# Patient Record
Sex: Female | Born: 1955 | Race: Black or African American | Hispanic: No | Smoking: Former smoker
Health system: Southern US, Community
[De-identification: ages and names within clinical notes are randomized; demographics above are authoritative.]

## PROBLEM LIST (undated history)

## (undated) DIAGNOSIS — M1990 Unspecified osteoarthritis, unspecified site: Secondary | ICD-10-CM

## (undated) DIAGNOSIS — F419 Anxiety disorder, unspecified: Secondary | ICD-10-CM

## (undated) DIAGNOSIS — I10 Essential (primary) hypertension: Secondary | ICD-10-CM

## (undated) DIAGNOSIS — Z933 Colostomy status: Secondary | ICD-10-CM

## (undated) DIAGNOSIS — F32A Depression, unspecified: Secondary | ICD-10-CM

## (undated) DIAGNOSIS — F329 Major depressive disorder, single episode, unspecified: Secondary | ICD-10-CM

## (undated) DIAGNOSIS — K5792 Diverticulitis of intestine, part unspecified, without perforation or abscess without bleeding: Secondary | ICD-10-CM

## (undated) DIAGNOSIS — F209 Schizophrenia, unspecified: Secondary | ICD-10-CM

## (undated) HISTORY — PX: TUBAL LIGATION: SHX77

---

## 2000-12-18 ENCOUNTER — Encounter: Admission: RE | Admit: 2000-12-18 | Discharge: 2000-12-18 | Payer: Self-pay | Admitting: Internal Medicine

## 2015-06-07 ENCOUNTER — Emergency Department (HOSPITAL_COMMUNITY): Payer: Medicaid Other

## 2015-06-07 ENCOUNTER — Encounter (HOSPITAL_COMMUNITY): Payer: Self-pay | Admitting: *Deleted

## 2015-06-07 ENCOUNTER — Inpatient Hospital Stay (HOSPITAL_COMMUNITY)
Admission: EM | Admit: 2015-06-07 | Discharge: 2015-06-18 | DRG: 854 | Disposition: A | Discharge status: Skilled Nursing Facility | Payer: Medicaid Other | Attending: Internal Medicine | Admitting: Internal Medicine

## 2015-06-07 DIAGNOSIS — Z79899 Other long term (current) drug therapy: Secondary | ICD-10-CM

## 2015-06-07 DIAGNOSIS — I471 Supraventricular tachycardia: Secondary | ICD-10-CM | POA: Diagnosis present

## 2015-06-07 DIAGNOSIS — D72829 Elevated white blood cell count, unspecified: Secondary | ICD-10-CM | POA: Diagnosis not present

## 2015-06-07 DIAGNOSIS — A419 Sepsis, unspecified organism: Secondary | ICD-10-CM | POA: Diagnosis present

## 2015-06-07 DIAGNOSIS — M1990 Unspecified osteoarthritis, unspecified site: Secondary | ICD-10-CM | POA: Diagnosis present

## 2015-06-07 DIAGNOSIS — Z791 Long term (current) use of non-steroidal anti-inflammatories (NSAID): Secondary | ICD-10-CM | POA: Diagnosis not present

## 2015-06-07 DIAGNOSIS — I4891 Unspecified atrial fibrillation: Secondary | ICD-10-CM | POA: Diagnosis not present

## 2015-06-07 DIAGNOSIS — K567 Ileus, unspecified: Secondary | ICD-10-CM | POA: Diagnosis not present

## 2015-06-07 DIAGNOSIS — Z6839 Body mass index (BMI) 39.0-39.9, adult: Secondary | ICD-10-CM

## 2015-06-07 DIAGNOSIS — N179 Acute kidney failure, unspecified: Secondary | ICD-10-CM | POA: Diagnosis present

## 2015-06-07 DIAGNOSIS — Z7982 Long term (current) use of aspirin: Secondary | ICD-10-CM

## 2015-06-07 DIAGNOSIS — E669 Obesity, unspecified: Secondary | ICD-10-CM | POA: Diagnosis not present

## 2015-06-07 DIAGNOSIS — K5720 Diverticulitis of large intestine with perforation and abscess without bleeding: Secondary | ICD-10-CM | POA: Diagnosis not present

## 2015-06-07 DIAGNOSIS — N19 Unspecified kidney failure: Secondary | ICD-10-CM

## 2015-06-07 DIAGNOSIS — I959 Hypotension, unspecified: Secondary | ICD-10-CM | POA: Diagnosis present

## 2015-06-07 DIAGNOSIS — K659 Peritonitis, unspecified: Secondary | ICD-10-CM

## 2015-06-07 DIAGNOSIS — Z933 Colostomy status: Secondary | ICD-10-CM | POA: Diagnosis not present

## 2015-06-07 DIAGNOSIS — I4892 Unspecified atrial flutter: Secondary | ICD-10-CM | POA: Diagnosis not present

## 2015-06-07 DIAGNOSIS — F329 Major depressive disorder, single episode, unspecified: Secondary | ICD-10-CM | POA: Diagnosis present

## 2015-06-07 DIAGNOSIS — R109 Unspecified abdominal pain: Secondary | ICD-10-CM | POA: Diagnosis present

## 2015-06-07 DIAGNOSIS — E869 Volume depletion, unspecified: Secondary | ICD-10-CM | POA: Diagnosis present

## 2015-06-07 DIAGNOSIS — I10 Essential (primary) hypertension: Secondary | ICD-10-CM | POA: Diagnosis present

## 2015-06-07 DIAGNOSIS — F32A Depression, unspecified: Secondary | ICD-10-CM

## 2015-06-07 DIAGNOSIS — R300 Dysuria: Secondary | ICD-10-CM | POA: Diagnosis present

## 2015-06-07 DIAGNOSIS — N289 Disorder of kidney and ureter, unspecified: Secondary | ICD-10-CM

## 2015-06-07 DIAGNOSIS — I483 Typical atrial flutter: Secondary | ICD-10-CM | POA: Diagnosis not present

## 2015-06-07 DIAGNOSIS — D62 Acute posthemorrhagic anemia: Secondary | ICD-10-CM | POA: Diagnosis not present

## 2015-06-07 HISTORY — DX: Essential (primary) hypertension: I10

## 2015-06-07 LAB — CBC WITH DIFFERENTIAL/PLATELET
Basophils Absolute: 0 10*3/uL (ref 0.0–0.1)
Basophils Relative: 0 % (ref 0–1)
Eosinophils Absolute: 0 10*3/uL (ref 0.0–0.7)
Eosinophils Relative: 0 % (ref 0–5)
HCT: 37.5 % (ref 36.0–46.0)
Hemoglobin: 12.6 g/dL (ref 12.0–15.0)
Lymphocytes Relative: 7 % — ABNORMAL LOW (ref 12–46)
Lymphs Abs: 1 10*3/uL (ref 0.7–4.0)
MCH: 29.4 pg (ref 26.0–34.0)
MCHC: 33.6 g/dL (ref 30.0–36.0)
MCV: 87.4 fL (ref 78.0–100.0)
Monocytes Absolute: 1 10*3/uL (ref 0.1–1.0)
Monocytes Relative: 7 % (ref 3–12)
Neutro Abs: 12.8 10*3/uL — ABNORMAL HIGH (ref 1.7–7.7)
Neutrophils Relative %: 86 % — ABNORMAL HIGH (ref 43–77)
Platelets: 336 10*3/uL (ref 150–400)
RBC: 4.29 MIL/uL (ref 3.87–5.11)
RDW: 13.7 % (ref 11.5–15.5)
WBC: 14.8 10*3/uL — ABNORMAL HIGH (ref 4.0–10.5)

## 2015-06-07 LAB — URINALYSIS, ROUTINE W REFLEX MICROSCOPIC
Bilirubin Urine: NEGATIVE
Glucose, UA: NEGATIVE mg/dL
Hgb urine dipstick: NEGATIVE
Ketones, ur: NEGATIVE mg/dL
Leukocytes, UA: NEGATIVE
Nitrite: NEGATIVE
Protein, ur: NEGATIVE mg/dL
Specific Gravity, Urine: 1.017 (ref 1.005–1.030)
Urobilinogen, UA: 0.2 mg/dL (ref 0.0–1.0)
pH: 5 (ref 5.0–8.0)

## 2015-06-07 LAB — BASIC METABOLIC PANEL
Anion gap: 11 (ref 5–15)
BUN: 28 mg/dL — ABNORMAL HIGH (ref 6–20)
CO2: 19 mmol/L — ABNORMAL LOW (ref 22–32)
Calcium: 8.7 mg/dL — ABNORMAL LOW (ref 8.9–10.3)
Chloride: 105 mmol/L (ref 101–111)
Creatinine, Ser: 1.43 mg/dL — ABNORMAL HIGH (ref 0.44–1.00)
GFR calc Af Amer: 46 mL/min — ABNORMAL LOW (ref >60)
GFR calc non Af Amer: 40 mL/min — ABNORMAL LOW (ref >60)
Glucose, Bld: 115 mg/dL — ABNORMAL HIGH (ref 65–99)
Potassium: 3.7 mmol/L (ref 3.5–5.1)
Sodium: 135 mmol/L (ref 135–145)

## 2015-06-07 LAB — MRSA PCR SCREENING: MRSA by PCR: NEGATIVE

## 2015-06-07 MED ORDER — ONDANSETRON HCL 4 MG/2ML IJ SOLN
4.0000 mg | Freq: Four times a day (QID) | INTRAMUSCULAR | Status: DC | PRN
  Administered 2015-06-07 – 2015-06-12 (×12): 4 mg via INTRAVENOUS
  Filled 2015-06-07 (×15): qty 2

## 2015-06-07 MED ORDER — SODIUM CHLORIDE 0.9 % IV SOLN
INTRAVENOUS | Status: DC
  Administered 2015-06-07 – 2015-06-09 (×5): via INTRAVENOUS
  Administered 2015-06-11: 1000 mL via INTRAVENOUS
  Administered 2015-06-12: 125 mL via INTRAVENOUS

## 2015-06-07 MED ORDER — ENOXAPARIN SODIUM 40 MG/0.4ML ~~LOC~~ SOLN
40.0000 mg | SUBCUTANEOUS | Status: DC
  Filled 2015-06-07: qty 0.4

## 2015-06-07 MED ORDER — CIPROFLOXACIN IN D5W 400 MG/200ML IV SOLN
400.0000 mg | Freq: Two times a day (BID) | INTRAVENOUS | Status: DC
  Administered 2015-06-07: 400 mg via INTRAVENOUS
  Filled 2015-06-07: qty 200

## 2015-06-07 MED ORDER — METRONIDAZOLE IN NACL 5-0.79 MG/ML-% IV SOLN
500.0000 mg | Freq: Once | INTRAVENOUS | Status: AC
  Administered 2015-06-07: 500 mg via INTRAVENOUS
  Filled 2015-06-07: qty 100

## 2015-06-07 MED ORDER — CIPROFLOXACIN IN D5W 400 MG/200ML IV SOLN
400.0000 mg | Freq: Once | INTRAVENOUS | Status: AC
  Administered 2015-06-07: 400 mg via INTRAVENOUS
  Filled 2015-06-07: qty 200

## 2015-06-07 MED ORDER — ONDANSETRON HCL 4 MG PO TABS: 4.0000 mg | ORAL_TABLET | Freq: Four times a day (QID) | ORAL | Status: DC | PRN

## 2015-06-07 MED ORDER — SODIUM CHLORIDE 0.9 % IV SOLN: INTRAVENOUS | Status: DC

## 2015-06-07 MED ORDER — HYDROMORPHONE HCL 1 MG/ML IJ SOLN
1.0000 mg | Freq: Once | INTRAMUSCULAR | Status: AC
  Administered 2015-06-07: 1 mg via INTRAVENOUS
  Filled 2015-06-07: qty 1

## 2015-06-07 MED ORDER — IOHEXOL 300 MG/ML  SOLN
50.0000 mL | Freq: Once | INTRAMUSCULAR | Status: AC | PRN
  Administered 2015-06-07: 50 mL via ORAL

## 2015-06-07 MED ORDER — ONDANSETRON HCL 4 MG/2ML IJ SOLN
4.0000 mg | Freq: Once | INTRAMUSCULAR | Status: AC
  Administered 2015-06-07: 4 mg via INTRAVENOUS
  Filled 2015-06-07: qty 2

## 2015-06-07 MED ORDER — LORAZEPAM 2 MG/ML IJ SOLN
0.5000 mg | Freq: Once | INTRAMUSCULAR | Status: AC
  Administered 2015-06-07: 0.5 mg via INTRAVENOUS
  Filled 2015-06-07: qty 1

## 2015-06-07 MED ORDER — METRONIDAZOLE IN NACL 5-0.79 MG/ML-% IV SOLN
500.0000 mg | Freq: Three times a day (TID) | INTRAVENOUS | Status: DC
  Administered 2015-06-07: 500 mg via INTRAVENOUS
  Filled 2015-06-07 (×2): qty 100

## 2015-06-07 MED ORDER — HYDROMORPHONE HCL 1 MG/ML IJ SOLN
1.0000 mg | Freq: Once | INTRAMUSCULAR | Status: AC
Start: 2015-06-07 — End: 2015-06-07
  Administered 2015-06-07: 1 mg via INTRAVENOUS
  Filled 2015-06-07: qty 1

## 2015-06-07 MED ORDER — HYDROMORPHONE HCL 1 MG/ML IJ SOLN
1.0000 mg | INTRAMUSCULAR | Status: DC | PRN
  Administered 2015-06-07 – 2015-06-09 (×12): 1 mg via INTRAVENOUS
  Filled 2015-06-07 (×16): qty 1

## 2015-06-07 MED ORDER — ALUM & MAG HYDROXIDE-SIMETH 200-200-20 MG/5ML PO SUSP
30.0000 mL | Freq: Four times a day (QID) | ORAL | Status: DC | PRN
  Administered 2015-06-07: 30 mL via ORAL
  Filled 2015-06-07 (×2): qty 30

## 2015-06-07 MED ORDER — SODIUM CHLORIDE 0.9 % IV BOLUS (SEPSIS)
1000.0000 mL | Freq: Once | INTRAVENOUS | Status: AC
Start: 2015-06-07 — End: 2015-06-07
  Administered 2015-06-07: 1000 mL via INTRAVENOUS

## 2015-06-07 MED ORDER — IOHEXOL 300 MG/ML  SOLN
100.0000 mL | Freq: Once | INTRAMUSCULAR | Status: AC | PRN
  Administered 2015-06-07: 100 mL via INTRAVENOUS

## 2015-06-07 NOTE — ED Provider Notes (Addendum)
CSN: [id number]     Arrival date & time 06/07/15  [id number] History   First MD Initiated Contact with Patient 06/07/15 0935     Chief Complaint  Patient presents with  . Back Pain  . Abdominal Pain     (Consider location/radiation/quality/duration/timing/severity/associated sxs/prior Treatment) HPI   59 year old female with lower abdominal and lower back pain. Gradual onset Friday-Saturday. Progressively worsening since then. Describes pain as sharp and constant. It hurts in "both my fallopian tubes." Also pain in the lower back. Associated with dysuria. No hematuria, unusual vaginal bleeding or discharge. No fevers or chills. Nausea but no vomiting. No history similar type pain. Tried Aleve and Goody's powder with no change in symptoms.  Past Medical History  Diagnosis Date  . Hypertension    Past Surgical History  Procedure Laterality Date  . Tubal ligation     No family history on file. History  Substance Use Topics  . Smoking status: Never Smoker   . Smokeless tobacco: Not on file  . Alcohol Use: No   OB History    No data available     Review of Systems  All systems reviewed and negative, other than as noted in HPI.   Allergies  Review of patient's allergies indicates no known allergies.  Home Medications   Prior to Admission medications   Not on File   SpO2 95% Physical Exam  Constitutional: She appears well-developed and well-nourished. No distress.  Laying in bed on right side. Appears uncomfortable.  HENT:  Head: Normocephalic and atraumatic.  Eyes: Conjunctivae are normal. Right eye exhibits no discharge. Left eye exhibits no discharge.  Neck: Neck supple.  Cardiovascular: Normal rate, regular rhythm and normal heart sounds.  Exam reveals no gallop and no friction rub.   No murmur heard. Pulmonary/Chest: Effort normal and breath sounds normal. No respiratory distress.  Abdominal: Soft. She exhibits no distension. There is tenderness.  Lower abdominal  tenderness with guarding. No rebound.   Musculoskeletal: She exhibits no edema or tenderness.  Neurological: She is alert.  Skin: Skin is warm and dry.  Psychiatric: She has a normal mood and affect. Her behavior is normal. Thought content normal.  Nursing note and vitals reviewed.   ED Course  Procedures (including critical care time) CRITICAL CARE Performed by: Virgel Manifold Total critical care time: 35 minutes Critical care time was exclusive of separately billable procedures and treating other patients. Critical care was necessary to treat or prevent imminent or life-threatening deterioration. Critical care was time spent personally by me on the following activities: development of treatment plan with patient and/or surrogate as well as nursing, discussions with consultants, evaluation of patient's response to treatment, examination of patient, obtaining history from patient or surrogate, ordering and performing treatments and interventions, ordering and review of laboratory studies, ordering and review of radiographic studies, pulse oximetry and re-evaluation of patient's condition.   Labs Review Labs Reviewed  CBC WITH DIFFERENTIAL/PLATELET - Abnormal; Notable for the following:    WBC 14.8 (*)    Neutrophils Relative % 86 (*)    Lymphocytes Relative 7 (*)    Neutro Abs 12.8 (*)    All other components within normal limits  BASIC METABOLIC PANEL - Abnormal; Notable for the following:    CO2 19 (*)    Glucose, Bld 115 (*)    BUN 28 (*)    Creatinine, Ser 1.43 (*)    Calcium 8.7 (*)    GFR calc non Af Amer 40 (*)  GFR calc Af Amer 46 (*)    All other components within normal limits  URINALYSIS, ROUTINE W REFLEX MICROSCOPIC (NOT AT [facility])    Imaging Review Ct Abdomen Pelvis W Contrast  06/07/2015   CLINICAL DATA:  Bilateral lower back pain.  EXAM: CT ABDOMEN AND PELVIS WITH CONTRAST  TECHNIQUE: Multidetector CT imaging of the abdomen and pelvis was performed using the  standard protocol following bolus administration of intravenous contrast.  CONTRAST:  166mL OMNIPAQUE IOHEXOL 300 MG/ML  SOLN  COMPARISON:  None.  FINDINGS: There is bibasilar atelectasis, right side greater the left. There is free intraperitoneal air throughout the upper abdomen. Pockets of free air in the lower abdomen. There is a small amount of perihepatic fluid. Normal appearance of the liver and gallbladder. Portal venous system is patent. Normal appearance of the pancreas, spleen and adrenal glands. Normal appearance of both kidneys.  Normal appearance of the stomach and duodenum. There is oral contrast in the distal small bowel and right colon. There is a markedly thickened loop of small bowel in the anterior lower abdomen on sequence 2, image 84. The distal small bowel and the right colon are distended. No significant wall thickening involving the terminal ileum. There is a small amount of fluid in the right lower quadrant of the abdomen. Mild pericolonic inflammatory changes involving the distal descending colon and there are diverticula involving the descending colon and sigmoid colon. There is focal edema or fluid between the sigmoid colon and the thickened loop of small bowel on sequence 2, image 81. No discrete abscess collection.  Small amount of free fluid in the pelvis. No gross abnormality to the uterus or adnexa tissue. Small amount of free fluid along the anterior lower abdomen adjacent to the thickened loop of small bowel. Patient has a periumbilical hernia with mild mesenteric edema.  There is no significant lymphadenopathy in the abdomen or pelvis. However, there is concern for mild lymphadenopathy in the hilar regions bilaterally.  Facet arthropathy in the lower lumbar spine. No acute bone abnormality.  IMPRESSION: Study is positive for pneumoperitoneum. There is fluid and inflammatory changes throughout the lower abdomen and upper pelvis. A short segment of small bowel has wall thickening  and compatible with enteritis. This loop of small bowel is adjacent to the sigmoid colon and there are inflammatory changes adjacent to the distal descending colon and sigmoid colon. Findings raise concern for underlying diverticulitis. These inflammatory changes could represent a perforated diverticulitis with secondary small bowel enteritis. However, a primary small bowel enteritis cannot be excluded. In addition, there is dilatation of the distal small bowel and right colon which could be related to an ileus.  Concern for mild chest lymphadenopathy in the hilar regions. This could be further characterized with a post contrast chest CT.  These results were called by telephone at the time of interpretation on 06/07/2015 at 1:07 pm to Dr. Virgel Manifold , who verbally acknowledged these results.   Electronically Signed   By: Markus Daft M.D.   On: 06/07/2015 13:15     EKG Interpretation None      MDM   Final diagnoses:  Diverticulitis of colon with perforation  Peritonitis    59 year old female with lower abdominal pain. Associated dysuria. Suspect possible UTI or pyelonephritis with pain in her back as well. Will check UA. Degree of pain and tenderness seem out of proportion of what typically see with UTI/pyelo though. Will CT to further evaluate.  Imaging as above. Abx. Surgery consult.  2:11 PM Discussed with Dr. Hassell Done, surgery, who reviewed imaging and evaluated patient in the ED. Medical management at this point. Will discuss with medicine for admission.    Virgel Manifold, MD 06/08/15 [phone]

## 2015-06-07 NOTE — ED Notes (Signed)
Per EMS pt coming from home with c/o  Bilateral lower back pain radiating to lower abdomen and vagina x 2 days, worse today. Pt rates pain as 10/10, she is crying loudly.

## 2015-06-07 NOTE — Consult Note (Signed)
Chief Complaint:  Left sided abdominal pain  History of Present Illness:  Deanna Aguilar is an 59 y.o. female who presents with a several day history of worsening LLQ abdominal pain worse when she tries to go to have a BM.  She had a colonoscopy last year in [place] and was told that she had diverticulosis.  CT today shows diverticulitis with some bubbles of free air.    Past Medical History  Diagnosis Date  . Hypertension     Past Surgical History  Procedure Laterality Date  . Tubal ligation      No current facility-administered medications for this encounter.   Current Outpatient Prescriptions  Medication Sig Dispense Refill  . Aspirin-Acetaminophen-Caffeine 520-260-32.5 MG PACK Take 1-2 packets by mouth every 6 (six) hours as needed (For back pain.).    Marland Kitchen Aspirin-Caffeine 845-65 MG PACK Take 1 packet by mouth every 6 (six) hours as needed (For back pain.).    Marland Kitchen losartan-hydrochlorothiazide (HYZAAR) 100-12.5 MG per tablet Take 1 tablet by mouth daily.    . naproxen sodium (ALEVE) 220 MG tablet Take 440-660 mg by mouth every 8 (eight) hours as needed (For back pain.).     Review of patient's allergies indicates no known allergies. No family history on file. Social History:   reports that she has never smoked. She does not have any smokeless tobacco history on file. She reports that she does not drink alcohol or use illicit drugs.   REVIEW OF SYSTEMS :   Physical Exam:   Blood pressure 109/70, pulse 87, temperature 98.6 F (37 C), temperature source Oral, resp. rate 19, SpO2 97 %. There is no height or weight on file to calculate BMI.  Gen:  WDobese AAF with LLQ abdominal pain  Neurological: Alert and oriented to person, place, and time. Motor and sensory function is grossly intact  Head: Normocephalic and atraumatic.  Abdomen:  Obese; tender on the left side mostly LLQ  LABORATORY RESULTS: Results for orders placed or performed during the hospital encounter of 06/07/15 (from  the past 48 hour(s))  CBC with Differential     Status: Abnormal   Collection Time: 06/07/15 10:18 AM  Result Value Ref Range   WBC 14.8 (H) 4.0 - 10.5 K/uL   RBC 4.29 3.87 - 5.11 MIL/uL   Hemoglobin 12.6 12.0 - 15.0 g/dL   HCT 37.5 36.0 - 46.0 %   MCV 87.4 78.0 - 100.0 fL   MCH 29.4 26.0 - 34.0 pg   MCHC 33.6 30.0 - 36.0 g/dL   RDW 13.7 11.5 - 15.5 %   Platelets 336 150 - 400 K/uL   Neutrophils Relative % 86 (H) 43 - 77 %   Lymphocytes Relative 7 (L) 12 - 46 %   Monocytes Relative 7 3 - 12 %   Eosinophils Relative 0 0 - 5 %   Basophils Relative 0 0 - 1 %   Neutro Abs 12.8 (H) 1.7 - 7.7 K/uL   Lymphs Abs 1.0 0.7 - 4.0 K/uL   Monocytes Absolute 1.0 0.1 - 1.0 K/uL   Eosinophils Absolute 0.0 0.0 - 0.7 K/uL   Basophils Absolute 0.0 0.0 - 0.1 K/uL   WBC Morphology WHITE COUNT CONFIRMED ON SMEAR    Smear Review MORPHOLOGY UNREMARKABLE   Basic metabolic panel     Status: Abnormal   Collection Time: 06/07/15 10:18 AM  Result Value Ref Range   Sodium 135 135 - 145 mmol/L   Potassium 3.7 3.5 - 5.1 mmol/L  Chloride 105 101 - 111 mmol/L   CO2 19 (L) 22 - 32 mmol/L   Glucose, Bld 115 (H) 65 - 99 mg/dL   BUN 28 (H) 6 - 20 mg/dL   Creatinine, Ser 1.43 (H) 0.44 - 1.00 mg/dL   Calcium 8.7 (L) 8.9 - 10.3 mg/dL   GFR calc non Af Amer 40 (L) >60 mL/min   GFR calc Af Amer 46 (L) >60 mL/min    Comment: (NOTE) The eGFR has been calculated using the CKD EPI equation. This calculation has not been validated in all clinical situations. eGFR's persistently <60 mL/min signify possible Chronic Kidney Disease.    Anion gap 11 5 - 15  Urinalysis, Routine w reflex microscopic (not at [facility])     Status: None   Collection Time: 06/07/15 12:30 PM  Result Value Ref Range   Color, Urine YELLOW YELLOW   APPearance CLEAR CLEAR   Specific Gravity, Urine 1.017 1.005 - 1.030   pH 5.0 5.0 - 8.0   Glucose, UA NEGATIVE NEGATIVE mg/dL   Hgb urine dipstick NEGATIVE NEGATIVE   Bilirubin Urine NEGATIVE NEGATIVE    Ketones, ur NEGATIVE NEGATIVE mg/dL   Protein, ur NEGATIVE NEGATIVE mg/dL   Urobilinogen, UA 0.2 0.0 - 1.0 mg/dL   Nitrite NEGATIVE NEGATIVE   Leukocytes, UA NEGATIVE NEGATIVE    Comment: MICROSCOPIC NOT DONE ON URINES WITH NEGATIVE PROTEIN, BLOOD, LEUKOCYTES, NITRITE, OR GLUCOSE <1000 mg/dL.     RADIOLOGY RESULTS: Ct Abdomen Pelvis W Contrast  06/07/2015   ADDENDUM REPORT: 06/07/2015 13:56  ADDENDUM: The urinary bladder is decompressed and difficult to exclude urinary bladder wall thickening or inflammation.   Electronically Signed   By: Markus Daft M.D.   On: 06/07/2015 13:56   06/07/2015   CLINICAL DATA:  Bilateral lower back pain.  EXAM: CT ABDOMEN AND PELVIS WITH CONTRAST  TECHNIQUE: Multidetector CT imaging of the abdomen and pelvis was performed using the standard protocol following bolus administration of intravenous contrast.  CONTRAST:  146m OMNIPAQUE IOHEXOL 300 MG/ML  SOLN  COMPARISON:  None.  FINDINGS: There is bibasilar atelectasis, right side greater the left. There is free intraperitoneal air throughout the upper abdomen. Pockets of free air in the lower abdomen. There is a small amount of perihepatic fluid. Normal appearance of the liver and gallbladder. Portal venous system is patent. Normal appearance of the pancreas, spleen and adrenal glands. Normal appearance of both kidneys.  Normal appearance of the stomach and duodenum. There is oral contrast in the distal small bowel and right colon. There is a markedly thickened loop of small bowel in the anterior lower abdomen on sequence 2, image 84. The distal small bowel and the right colon are distended. No significant wall thickening involving the terminal ileum. There is a small amount of fluid in the right lower quadrant of the abdomen. Mild pericolonic inflammatory changes involving the distal descending colon and there are diverticula involving the descending colon and sigmoid colon. There is focal edema or fluid between the sigmoid  colon and the thickened loop of small bowel on sequence 2, image 81. No discrete abscess collection.  Small amount of free fluid in the pelvis. No gross abnormality to the uterus or adnexa tissue. Small amount of free fluid along the anterior lower abdomen adjacent to the thickened loop of small bowel. Patient has a periumbilical hernia with mild mesenteric edema.  There is no significant lymphadenopathy in the abdomen or pelvis. However, there is concern for mild lymphadenopathy in the hilar  regions bilaterally.  Facet arthropathy in the lower lumbar spine. No acute bone abnormality.  IMPRESSION: Study is positive for pneumoperitoneum. There is fluid and inflammatory changes throughout the lower abdomen and upper pelvis. A short segment of small bowel has wall thickening and compatible with enteritis. This loop of small bowel is adjacent to the sigmoid colon and there are inflammatory changes adjacent to the distal descending colon and sigmoid colon. Findings raise concern for underlying diverticulitis. These inflammatory changes could represent a perforated diverticulitis with secondary small bowel enteritis. However, a primary small bowel enteritis cannot be excluded. In addition, there is dilatation of the distal small bowel and right colon which could be related to an ileus.  Concern for mild chest lymphadenopathy in the hilar regions. This could be further characterized with a post contrast chest CT.  These results were called by telephone at the time of interpretation on 06/07/2015 at 1:07 pm to Dr. Virgel Manifold , who verbally acknowledged these results.  Electronically Signed: By: Markus Daft M.D. On: 06/07/2015 13:15    Problem List: Patient Active Problem List   Diagnosis Date Noted  . Diverticulitis of colon with perforation 06/07/2015    Assessment & Plan: Patient seen and CT scan reviewed.  I think that this would be best managed medically with IV antibiotics.  There is nothing to drain at  present.  She would have a difficult time with a Hartmann procedure.   CCS will follow.      Matt B. Hassell Done, MD, [facility] [phone] beeper [phone]  06/07/2015 2:24 PM

## 2015-06-07 NOTE — ED Notes (Signed)
Patient transported to CT 

## 2015-06-07 NOTE — H&P (Addendum)
[facility] History and Physical  AMBA LIMONES [id number] DOB: 07-Jun-1956 DOA: 06/07/2015   PCP: [name], MD    Chief Complaint: Abdominal pain  HPI: Deanna Aguilar is a 59 y.o. female with hypertension who presents with abdominal pain for 2 days. She points to the suprapubic area and states that it radiates from there to the right and left upper abdomen into her back. She feels pain when she tries to urinate as well. Pain quite severe. She has had nausea but no vomiting. She has had no appetite. She's felt hot and cold but has not checked her temperature. He has been taking Aleve and BC powders help control pain. Dilaudid given in the ER has helped her pain.   General: The patient denies fever, weight loss + anorexia Cardiac: Denies chest pain, syncope, palpitations, pedal edema  Respiratory: Denies cough, shortness of breath, wheezing GI: She's had some small "squirts of stool" other history per history of present illness GU: Denies hematuria, incontinence, + dysuria  Musculoskeletal: + arthritis all over does not take any medication for this Skin: Denies suspicious skin lesions Neurologic: Denies focal weakness or numbness, change in vision Psychiatry: Denies depression or anxiety. Hematologic: no easy bruising or bleeding  All other systems reviewed and found to be negative.  Past Medical History  Diagnosis Date  . Hypertension     Past Surgical History  Procedure Laterality Date  . Tubal ligation      Social History: does not smoke or drink alcohol Lives at home- on disability for inability to ambulate for a long distance    No Known Allergies  Family history:  Mother had renal failure which caused her death.     Prior to Admission medications   Medication Sig Start Date End Date Taking? Authorizing Provider  Aspirin-Acetaminophen-Caffeine 520-260-32.5 MG PACK Take 1-2 packets by mouth every 6 (six) hours as needed (For back pain.).   Yes  Historical Provider, MD  Aspirin-Caffeine 845-65 MG PACK Take 1 packet by mouth every 6 (six) hours as needed (For back pain.).   Yes Historical Provider, MD  losartan-hydrochlorothiazide (HYZAAR) 100-12.5 MG per tablet Take 1 tablet by mouth daily.   Yes Historical Provider, MD  naproxen sodium (ALEVE) 220 MG tablet Take 440-660 mg by mouth every 8 (eight) hours as needed (For back pain.).   Yes Historical Provider, MD     Physical Exam: Filed Vitals:   06/07/15 [id number] 06/07/15 0938 06/07/15 1117 06/07/15 1315  BP:  137/61 131/67 109/70  Pulse:  92 85 87  Temp:  98.5 F (36.9 C)  98.6 F (37 C)  TempSrc:  Oral  Oral  Resp:  20 18 19   SpO2: 95% 95% 96% 97%     General: Alert oriented 3, mild amount of distress HEENT: Normocephalic and Atraumatic, Mucous membranes pink                PERRLA; EOM intact; No scleral icterus,                 Nares: Patent, Oropharynx: Clear, Fair Dentition                 Neck: FROM, no cervical lymphadenopathy, thyromegaly, carotid bruit or JVD;  Breasts: deferred CHEST WALL: No tenderness  CHEST: Normal respiration, clear to auscultation bilaterally  HEART: Regular rate and rhythm; no murmurs rubs or gallops  BACK: No kyphosis or scoliosis; no CVA tenderness  GI: Diffusely tender abdomen, no bowel sounds, nondistended Rectal  Exam: deferred MSK: No cyanosis, clubbing, or edema Genitalia: not examined  SKIN:  no rash or ulceration  CNS: Alert and Oriented x 4, Nonfocal exam, CN 2-12 intact  Labs on Admission:  Basic Metabolic Panel:  Recent Labs Lab 06/07/15 1018  NA 135  K 3.7  CL 105  CO2 19*  GLUCOSE 115*  BUN 28*  CREATININE 1.43*  CALCIUM 8.7*   Liver Function Tests: No results for input(s): AST, ALT, ALKPHOS, BILITOT, PROT, ALBUMIN in the last 168 hours. No results for input(s): LIPASE, AMYLASE in the last 168 hours. No results for input(s): AMMONIA in the last 168 hours. CBC:  Recent Labs Lab 06/07/15 1018  WBC 14.8*   NEUTROABS 12.8*  HGB 12.6  HCT 37.5  MCV 87.4  PLT 336   Cardiac Enzymes: No results for input(s): CKTOTAL, CKMB, CKMBINDEX, TROPONINI in the last 168 hours.  BNP (last 3 results) No results for input(s): BNP in the last 8760 hours.  ProBNP (last 3 results) No results for input(s): PROBNP in the last 8760 hours.  CBG: No results for input(s): GLUCAP in the last 168 hours.  Radiological Exams on Admission: Ct Abdomen Pelvis W Contrast  06/07/2015   ADDENDUM REPORT: 06/07/2015 13:56  ADDENDUM: The urinary bladder is decompressed and difficult to exclude urinary bladder wall thickening or inflammation.   Electronically Signed   By: Markus Daft M.D.   On: 06/07/2015 13:56   06/07/2015   CLINICAL DATA:  Bilateral lower back pain.  EXAM: CT ABDOMEN AND PELVIS WITH CONTRAST  TECHNIQUE: Multidetector CT imaging of the abdomen and pelvis was performed using the standard protocol following bolus administration of intravenous contrast.  CONTRAST:  136mL OMNIPAQUE IOHEXOL 300 MG/ML  SOLN  COMPARISON:  None.  FINDINGS: There is bibasilar atelectasis, right side greater the left. There is free intraperitoneal air throughout the upper abdomen. Pockets of free air in the lower abdomen. There is a small amount of perihepatic fluid. Normal appearance of the liver and gallbladder. Portal venous system is patent. Normal appearance of the pancreas, spleen and adrenal glands. Normal appearance of both kidneys.  Normal appearance of the stomach and duodenum. There is oral contrast in the distal small bowel and right colon. There is a markedly thickened loop of small bowel in the anterior lower abdomen on sequence 2, image 84. The distal small bowel and the right colon are distended. No significant wall thickening involving the terminal ileum. There is a small amount of fluid in the right lower quadrant of the abdomen. Mild pericolonic inflammatory changes involving the distal descending colon and there are diverticula  involving the descending colon and sigmoid colon. There is focal edema or fluid between the sigmoid colon and the thickened loop of small bowel on sequence 2, image 81. No discrete abscess collection.  Small amount of free fluid in the pelvis. No gross abnormality to the uterus or adnexa tissue. Small amount of free fluid along the anterior lower abdomen adjacent to the thickened loop of small bowel. Patient has a periumbilical hernia with mild mesenteric edema.  There is no significant lymphadenopathy in the abdomen or pelvis. However, there is concern for mild lymphadenopathy in the hilar regions bilaterally.  Facet arthropathy in the lower lumbar spine. No acute bone abnormality.  IMPRESSION: Study is positive for pneumoperitoneum. There is fluid and inflammatory changes throughout the lower abdomen and upper pelvis. A short segment of small bowel has wall thickening and compatible with enteritis. This loop of small bowel  is adjacent to the sigmoid colon and there are inflammatory changes adjacent to the distal descending colon and sigmoid colon. Findings raise concern for underlying diverticulitis. These inflammatory changes could represent a perforated diverticulitis with secondary small bowel enteritis. However, a primary small bowel enteritis cannot be excluded. In addition, there is dilatation of the distal small bowel and right colon which could be related to an ileus.  Concern for mild chest lymphadenopathy in the hilar regions. This could be further characterized with a post contrast chest CT.  These results were called by telephone at the time of interpretation on 06/07/2015 at 1:07 pm to Dr. Virgel Manifold , who verbally acknowledged these results.  Electronically Signed: By: Markus Daft M.D. On: 06/07/2015 13:15     Assessment/Plan Principal Problem:   Diverticulitis of colon with perforation/leukocytosis/peritonitis -The patient has been evaluated by Dr. Hassell Done from surgery who feels that currently  it can be medically managed - She has received ciprofloxacin and Flagyl in the ER which I will continue -I will place her in the step down unit-follow blood pressure closely  - Nothing by mouth-aggressive hydration - has received a liter of fluids in the ER  Active Problems:  Renal failure -May be related to NSAIDs and ACE inhibitor along with a prerenal state -Have no old lab work to determine if this is acute or chronic -Hold ACE inhibitor -Hydrate    HTN (hypertension) -Normotensive currently with the chance of becoming hypotensive from sepsis-hold anti-hypertensives   Consulted: General surgery   Code Status: Full code   Family Communication:   DVT Prophylaxis:Lovenox   Time spent: 65 minutes   Regina, MD [facility]  If 7PM-7AM, please contact night-coverage [email or web address] 06/07/2015, 2:51 PM

## 2015-06-07 NOTE — Progress Notes (Signed)
ANTIBIOTIC CONSULT NOTE - INITIAL  Pharmacy Consult for Cipro Indication: Intra-abdominal infection  No Known Allergies  Patient Measurements:    Vital Signs: Temp: 98.6 F (37 C) (07/04 1315) Temp Source: Oral (07/04 1315) BP: 109/70 mmHg (07/04 1315) Pulse Rate: 87 (07/04 1315) Intake/Output from previous day:   Intake/Output from this shift:    Labs:  Recent Labs  06/07/15 1018  WBC 14.8*  HGB 12.6  PLT 336  CREATININE 1.43*   CrCl cannot be calculated (Unknown ideal weight.). No results for input(s): VANCOTROUGH, VANCOPEAK, VANCORANDOM, GENTTROUGH, GENTPEAK, GENTRANDOM, TOBRATROUGH, TOBRAPEAK, TOBRARND, AMIKACINPEAK, AMIKACINTROU, AMIKACIN in the last 72 hours.   Microbiology: No results found for this or any previous visit (from the past 720 hour(s)).  Medical History: Past Medical History  Diagnosis Date  . Hypertension     Medications:  Anti-infectives    Start     Dose/Rate Route Frequency Ordered Stop   06/07/15 1500  metroNIDAZOLE (FLAGYL) IVPB 500 mg     500 mg 100 mL/hr over 60 Minutes Intravenous Every 8 hours 06/07/15 1459     06/07/15 1300  ciprofloxacin (CIPRO) IVPB 400 mg     400 mg 200 mL/hr over 60 Minutes Intravenous  Once 06/07/15 1258 06/07/15 1422   06/07/15 1300  metroNIDAZOLE (FLAGYL) IVPB 500 mg     500 mg 100 mL/hr over 60 Minutes Intravenous  Once 06/07/15 1258 06/07/15 1422     Assessment: 58yo F w/ abdominal pain x 2 days. CT indicates diverticulitis. Flagyl and Cipro started in the ED. Pharmacy is asked to assist with Cipro. SCr is elevated, no history of renal dysfunction. CrCl ~23ml/min.  Goal of Therapy:  Appropriate antibiotic dosing for renal function; eradication of infection  Plan:  Cipro 400mg  IV q12h. Follow up renal fxn, culture results, and clinical course.  Romeo Rabon, PharmD, pager [phone]. 06/07/2015,3:22 PM.

## 2015-06-07 NOTE — Progress Notes (Signed)
Attempted to take patient's belongings to security. Waited in ED waiting area for security for 30 minutes. Apparently they were busy and could not take the belongings. Belongings returned to pt.

## 2015-06-07 NOTE — ED Notes (Signed)
Bed: [id number] Expected date:  Expected time:  Means of arrival:  Comments: dysuria

## 2015-06-07 NOTE — ED Notes (Signed)
Patient states she does not want to be touched right now or bothered because the pain meds are finally kicking in, she will call me in a bit when she is ready.

## 2015-06-07 NOTE — ED Notes (Signed)
I will get patient to urinate once she returns from CT.

## 2015-06-08 DIAGNOSIS — A419 Sepsis, unspecified organism: Principal | ICD-10-CM

## 2015-06-08 DIAGNOSIS — N179 Acute kidney failure, unspecified: Secondary | ICD-10-CM

## 2015-06-08 LAB — CBC
HEMATOCRIT: 33.5 % — AB (ref 36.0–46.0)
HEMOGLOBIN: 10.9 g/dL — AB (ref 12.0–15.0)
MCH: 28.6 pg (ref 26.0–34.0)
MCHC: 32.5 g/dL (ref 30.0–36.0)
MCV: 87.9 fL (ref 78.0–100.0)
Platelets: 277 10*3/uL (ref 150–400)
RBC: 3.81 MIL/uL — ABNORMAL LOW (ref 3.87–5.11)
RDW: 14 % (ref 11.5–15.5)
WBC: 14.7 10*3/uL — ABNORMAL HIGH (ref 4.0–10.5)

## 2015-06-08 LAB — BASIC METABOLIC PANEL
ANION GAP: 9 (ref 5–15)
BUN: 32 mg/dL — AB (ref 6–20)
CHLORIDE: 105 mmol/L (ref 101–111)
CO2: 21 mmol/L — ABNORMAL LOW (ref 22–32)
Calcium: 7.8 mg/dL — ABNORMAL LOW (ref 8.9–10.3)
Creatinine, Ser: 2.25 mg/dL — ABNORMAL HIGH (ref 0.44–1.00)
GFR calc Af Amer: 27 mL/min — ABNORMAL LOW (ref >60)
GFR, EST NON AFRICAN AMERICAN: 23 mL/min — AB (ref >60)
Glucose, Bld: 107 mg/dL — ABNORMAL HIGH (ref 65–99)
POTASSIUM: 3.8 mmol/L (ref 3.5–5.1)
Sodium: 135 mmol/L (ref 135–145)

## 2015-06-08 LAB — LACTIC ACID, PLASMA: Lactic Acid, Venous: 0.9 mmol/L (ref 0.5–2.0)

## 2015-06-08 MED ORDER — SODIUM CHLORIDE 0.9 % IV BOLUS (SEPSIS)
1000.0000 mL | Freq: Once | INTRAVENOUS | Status: AC
  Administered 2015-06-08: 1000 mL via INTRAVENOUS

## 2015-06-08 MED ORDER — SODIUM CHLORIDE 0.9 % IV BOLUS (SEPSIS): 500.0000 mL | Freq: Once | INTRAVENOUS | Status: AC

## 2015-06-08 MED ORDER — SODIUM CHLORIDE 0.9 % IV SOLN
1.0000 g | Freq: Every day | INTRAVENOUS | Status: AC
  Administered 2015-06-08 – 2015-06-16 (×9): 1 g via INTRAVENOUS
  Filled 2015-06-08 (×9): qty 1

## 2015-06-08 MED ORDER — HEPARIN SODIUM (PORCINE) 5000 UNIT/ML IJ SOLN
5000.0000 [IU] | Freq: Three times a day (TID) | INTRAMUSCULAR | Status: DC
  Administered 2015-06-08 (×3): 5000 [IU] via SUBCUTANEOUS
  Filled 2015-06-08 (×4): qty 1

## 2015-06-08 MED ORDER — SODIUM CHLORIDE 0.9 % IV SOLN
INTRAVENOUS | Status: DC
  Administered 2015-06-08: 07:00:00 via INTRAVENOUS
  Filled 2015-06-08 (×24): qty 1000

## 2015-06-08 MED ORDER — POTASSIUM CHLORIDE 10 MEQ/100ML IV SOLN: 10.0000 meq | INTRAVENOUS | Status: DC

## 2015-06-08 NOTE — H&P (Deleted)
[facility] History and Physical  Deanna Aguilar [id number] DOB: Nov 04, 1956 DOA: 06/07/2015   PCP: [name], MD    Chief Complaint: Abdominal pain  HPI: Deanna Aguilar is a 59 y.o. female with hypertension who presents with abdominal pain for 2 days. She points to the suprapubic area and states that it radiates from there to the right and left upper abdomen into her back. She feels pain when she tries to urinate as well. Pain quite severe. She has had nausea but no vomiting. She has had no appetite. She's felt hot and cold but has not checked her temperature. He has been taking Aleve and BC powders help control pain. Dilaudid given in the ER has helped her pain.   General: The patient denies fever, weight loss + anorexia Cardiac: Denies chest pain, syncope, palpitations, pedal edema  Respiratory: Denies cough, shortness of breath, wheezing GI: She's had some small "squirts of stool" other history per history of present illness GU: Denies hematuria, incontinence, + dysuria  Musculoskeletal: + arthritis all over does not take any medication for this Skin: Denies suspicious skin lesions Neurologic: Denies focal weakness or numbness, change in vision Psychiatry: Denies depression or anxiety. Hematologic: no easy bruising or bleeding  All other systems reviewed and found to be negative.  Past Medical History  Diagnosis Date  . Hypertension     Past Surgical History  Procedure Laterality Date  . Tubal ligation      Social History: does not smoke or drink alcohol Lives at home- on disability for inability to ambulate for a long distance    No Known Allergies  Family history:  Mother had renal failure which caused her death.     Prior to Admission medications   Medication Sig Start Date End Date Taking? Authorizing Provider  Aspirin-Acetaminophen-Caffeine 520-260-32.5 MG PACK Take 1-2 packets by mouth every 6 (six) hours as needed (For back pain.).   Yes  Historical Provider, MD  Aspirin-Caffeine 845-65 MG PACK Take 1 packet by mouth every 6 (six) hours as needed (For back pain.).   Yes Historical Provider, MD  losartan-hydrochlorothiazide (HYZAAR) 100-12.5 MG per tablet Take 1 tablet by mouth daily.   Yes Historical Provider, MD  naproxen sodium (ALEVE) 220 MG tablet Take 440-660 mg by mouth every 8 (eight) hours as needed (For back pain.).   Yes Historical Provider, MD     Physical Exam: Filed Vitals:   06/08/15 0600 06/08/15 0700 06/08/15 0800 06/08/15 0925  BP: 87/46  93/49 97/57  Pulse: 83 87 90 80  Temp:  99 F (37.2 C) 98.6 F (37 C) 98.6 F (37 C)  TempSrc:      Resp: 17 20 20 18   Height:      Weight:      SpO2: 97% 96% 97% 96%     General: Alert oriented 3, mild amount of distress HEENT: Normocephalic and Atraumatic, Mucous membranes pink                PERRLA; EOM intact; No scleral icterus,                 Nares: Patent, Oropharynx: Clear, Fair Dentition                 Neck: FROM, no cervical lymphadenopathy, thyromegaly, carotid bruit or JVD;  Breasts: deferred CHEST WALL: No tenderness  CHEST: Normal respiration, clear to auscultation bilaterally  HEART: Regular rate and rhythm; no murmurs rubs or gallops  BACK: No kyphosis  or scoliosis; no CVA tenderness  GI: Diffusely tender abdomen, no bowel sounds, nondistended Rectal Exam: deferred MSK: No cyanosis, clubbing, or edema Genitalia: not examined  SKIN:  no rash or ulceration  CNS: Alert and Oriented x 4, Nonfocal exam, CN 2-12 intact  Labs on Admission:  Basic Metabolic Panel:  Recent Labs Lab 06/07/15 1018 06/08/15 0400  NA 135 135  K 3.7 3.8  CL 105 105  CO2 19* 21*  GLUCOSE 115* 107*  BUN 28* 32*  CREATININE 1.43* 2.25*  CALCIUM 8.7* 7.8*   Liver Function Tests: No results for input(s): AST, ALT, ALKPHOS, BILITOT, PROT, ALBUMIN in the last 168 hours. No results for input(s): LIPASE, AMYLASE in the last 168 hours. No results for input(s):  AMMONIA in the last 168 hours. CBC:  Recent Labs Lab 06/07/15 1018 06/08/15 0400  WBC 14.8* 14.7*  NEUTROABS 12.8*  --   HGB 12.6 10.9*  HCT 37.5 33.5*  MCV 87.4 87.9  PLT 336 277   Cardiac Enzymes: No results for input(s): CKTOTAL, CKMB, CKMBINDEX, TROPONINI in the last 168 hours.  BNP (last 3 results) No results for input(s): BNP in the last 8760 hours.  ProBNP (last 3 results) No results for input(s): PROBNP in the last 8760 hours.  CBG: No results for input(s): GLUCAP in the last 168 hours.  Radiological Exams on Admission: Ct Abdomen Pelvis W Contrast  06/07/2015   ADDENDUM REPORT: 06/07/2015 13:56  ADDENDUM: The urinary bladder is decompressed and difficult to exclude urinary bladder wall thickening or inflammation.   Electronically Signed   By: Markus Daft M.D.   On: 06/07/2015 13:56   06/07/2015   CLINICAL DATA:  Bilateral lower back pain.  EXAM: CT ABDOMEN AND PELVIS WITH CONTRAST  TECHNIQUE: Multidetector CT imaging of the abdomen and pelvis was performed using the standard protocol following bolus administration of intravenous contrast.  CONTRAST:  121mL OMNIPAQUE IOHEXOL 300 MG/ML  SOLN  COMPARISON:  None.  FINDINGS: There is bibasilar atelectasis, right side greater the left. There is free intraperitoneal air throughout the upper abdomen. Pockets of free air in the lower abdomen. There is a small amount of perihepatic fluid. Normal appearance of the liver and gallbladder. Portal venous system is patent. Normal appearance of the pancreas, spleen and adrenal glands. Normal appearance of both kidneys.  Normal appearance of the stomach and duodenum. There is oral contrast in the distal small bowel and right colon. There is a markedly thickened loop of small bowel in the anterior lower abdomen on sequence 2, image 84. The distal small bowel and the right colon are distended. No significant wall thickening involving the terminal ileum. There is a small amount of fluid in the right  lower quadrant of the abdomen. Mild pericolonic inflammatory changes involving the distal descending colon and there are diverticula involving the descending colon and sigmoid colon. There is focal edema or fluid between the sigmoid colon and the thickened loop of small bowel on sequence 2, image 81. No discrete abscess collection.  Small amount of free fluid in the pelvis. No gross abnormality to the uterus or adnexa tissue. Small amount of free fluid along the anterior lower abdomen adjacent to the thickened loop of small bowel. Patient has a periumbilical hernia with mild mesenteric edema.  There is no significant lymphadenopathy in the abdomen or pelvis. However, there is concern for mild lymphadenopathy in the hilar regions bilaterally.  Facet arthropathy in the lower lumbar spine. No acute bone abnormality.  IMPRESSION: Study  is positive for pneumoperitoneum. There is fluid and inflammatory changes throughout the lower abdomen and upper pelvis. A short segment of small bowel has wall thickening and compatible with enteritis. This loop of small bowel is adjacent to the sigmoid colon and there are inflammatory changes adjacent to the distal descending colon and sigmoid colon. Findings raise concern for underlying diverticulitis. These inflammatory changes could represent a perforated diverticulitis with secondary small bowel enteritis. However, a primary small bowel enteritis cannot be excluded. In addition, there is dilatation of the distal small bowel and right colon which could be related to an ileus.  Concern for mild chest lymphadenopathy in the hilar regions. This could be further characterized with a post contrast chest CT.  These results were called by telephone at the time of interpretation on 06/07/2015 at 1:07 pm to Dr. Virgel Manifold , who verbally acknowledged these results.  Electronically Signed: By: Markus Daft M.D. On: 06/07/2015 13:15     Assessment/Plan Principal Problem:   Diverticulitis of  colon with perforation/leukocytosis/peritonitis -The patient has been evaluated by Dr. Hassell Done from surgery who feels that currently it can be medically managed - She has received ciprofloxacin and Flagyl in the ER which I will continue -I will place her in the step down unit-follow blood pressure closely  - Nothing by mouth-aggressive hydration - has received a liter of fluids in the ER  Active Problems:  Renal failure -May be related to NSAIDs and ACE inhibitor along with a prerenal state -Have no old lab work to determine if this is acute or chronic -Hold ACE inhibitor -Hydrate    HTN (hypertension) -Normotensive currently with the chance of becoming hypotensive from sepsis-hold anti-hypertensives   Consulted: General surgery   Code Status: Full code   Family Communication:   DVT Prophylaxis:Lovenox   Time spent: 60 minutes   G[place], MD [facility]  If 7PM-7AM, please contact night-coverage [email or web address] 06/08/2015, 9:40 AM

## 2015-06-08 NOTE — Progress Notes (Addendum)
Deanna Aguilar tearfully she spoke of being tired, feelings of guilt for not taking better care of herself, her estranged relationship with her sister and how she cared for her mother (until her passing last year) even though she felt unloved. She spoke of anxiety because of everything that's going on and said she doesn't know what to do. She especially spoke of her illness and I tried to assure her that while she is here we will take good care of her as a team. Deanna Aguilar described many reasons for the layers of grief and sadness she seems to have. I provided listening presence, encouragement as appropriate and assurance that it is her time to receive care while here. Deanna Aguilar described her life as being alone as she does not have other family members on whom she can depend. She has a cousin whom she described as having an ill father and sister who are presently hospitalized and she does not want to further burden her. Interweaved with all Deanna Aguilar' concern was her faith of which she spoke frequently. She wanted prayer and following prayer she was very appreciative of visit. Miu Sandefer Chaplain   06/08/15 2100  Clinical Encounter Type  Visited With Patient

## 2015-06-08 NOTE — Progress Notes (Addendum)
Subjective: Alert and cooperative.  Mental status normal. States pain is about the same.  More diffuse than localized. Vomited once when they tried to get her up in a chair. Does not have Foley.  BP little low, 87/52.  Pulse rate 79.  Temp 98.6.  Respiratory rate 18 and unlabored. Creatinine 2.25.  BUN 32.  Potassium 3.8.  Glucose 107.  WBC 14,700.  Hemoglobin 10.9.  CT reviewed.  Scattered bubbles of air throughout abdomen but no significant fluid.  Inflamed loop of small bowel adjacent to sigmoid colon with diverticula.  No abscess.  No obstruction.  Objective: Vital signs in last 24 hours: Temp:  [98.5 F (36.9 C)-99.2 F (37.3 C)] 98.6 F (37 C) (07/04 2000) Pulse Rate:  [68-95] 79 (07/05 0400) Resp:  [15-21] 18 (07/05 0400) BP: (84-137)/(51-71) 87/52 mmHg (07/05 0400) SpO2:  [92 %-100 %] 97 % (07/05 0400) Weight:  [121.1 kg (266 lb 15.6 oz)] 121.1 kg (266 lb 15.6 oz) (07/04 1615) Last BM Date: 06/07/15 (loose liquid stool)  Intake/Output from previous day: 07/04 0701 - 07/05 0700 In: 1010.4 [I.V.:710.4; IV Piggyback:300] Out: -  Intake/Output this shift: Total I/O In: 800 [I.V.:500; IV Piggyback:300] Out: -   General appearance: Alert.  Pleasant.  Cooperative.  Does not appear toxic.  Skin warm and dry. Resp: clear to auscultation bilaterally GI: Significant obesity.  Hypoactive bowel sounds.  He is tenderness with some guarding but mostly left lower quadrant.  Lab Results:   Recent Labs  06/07/15 1018 06/08/15 0400  WBC 14.8* 14.7*  HGB 12.6 10.9*  HCT 37.5 33.5*  PLT 336 277   BMET  Recent Labs  06/07/15 1018 06/08/15 0400  NA 135 135  K 3.7 3.8  CL 105 105  CO2 19* 21*  GLUCOSE 115* 107*  BUN 28* 32*  CREATININE 1.43* 2.25*  CALCIUM 8.7* 7.8*   PT/INR No results for input(s): LABPROT, INR in the last 72 hours. ABG No results for input(s): PHART, HCO3 in the last 72 hours.  Invalid input(s): PCO2, PO2  Studies/Results: Ct Abdomen  Pelvis W Contrast  06/07/2015   ADDENDUM REPORT: 06/07/2015 13:56  ADDENDUM: The urinary bladder is decompressed and difficult to exclude urinary bladder wall thickening or inflammation.   Electronically Signed   By: Markus Daft M.D.   On: 06/07/2015 13:56   06/07/2015   CLINICAL DATA:  Bilateral lower back pain.  EXAM: CT ABDOMEN AND PELVIS WITH CONTRAST  TECHNIQUE: Multidetector CT imaging of the abdomen and pelvis was performed using the standard protocol following bolus administration of intravenous contrast.  CONTRAST:  122mL OMNIPAQUE IOHEXOL 300 MG/ML  SOLN  COMPARISON:  None.  FINDINGS: There is bibasilar atelectasis, right side greater the left. There is free intraperitoneal air throughout the upper abdomen. Pockets of free air in the lower abdomen. There is a small amount of perihepatic fluid. Normal appearance of the liver and gallbladder. Portal venous system is patent. Normal appearance of the pancreas, spleen and adrenal glands. Normal appearance of both kidneys.  Normal appearance of the stomach and duodenum. There is oral contrast in the distal small bowel and right colon. There is a markedly thickened loop of small bowel in the anterior lower abdomen on sequence 2, image 84. The distal small bowel and the right colon are distended. No significant wall thickening involving the terminal ileum. There is a small amount of fluid in the right lower quadrant of the abdomen. Mild pericolonic inflammatory changes involving the distal descending colon and  there are diverticula involving the descending colon and sigmoid colon. There is focal edema or fluid between the sigmoid colon and the thickened loop of small bowel on sequence 2, image 81. No discrete abscess collection.  Small amount of free fluid in the pelvis. No gross abnormality to the uterus or adnexa tissue. Small amount of free fluid along the anterior lower abdomen adjacent to the thickened loop of small bowel. Patient has a periumbilical hernia  with mild mesenteric edema.  There is no significant lymphadenopathy in the abdomen or pelvis. However, there is concern for mild lymphadenopathy in the hilar regions bilaterally.  Facet arthropathy in the lower lumbar spine. No acute bone abnormality.  IMPRESSION: Study is positive for pneumoperitoneum. There is fluid and inflammatory changes throughout the lower abdomen and upper pelvis. A short segment of small bowel has wall thickening and compatible with enteritis. This loop of small bowel is adjacent to the sigmoid colon and there are inflammatory changes adjacent to the distal descending colon and sigmoid colon. Findings raise concern for underlying diverticulitis. These inflammatory changes could represent a perforated diverticulitis with secondary small bowel enteritis. However, a primary small bowel enteritis cannot be excluded. In addition, there is dilatation of the distal small bowel and right colon which could be related to an ileus.  Concern for mild chest lymphadenopathy in the hilar regions. This could be further characterized with a post contrast chest CT.  These results were called by telephone at the time of interpretation on 06/07/2015 at 1:07 pm to Dr. Virgel Manifold , who verbally acknowledged these results.  Electronically Signed: By: Markus Daft M.D. On: 06/07/2015 13:15    Anti-infectives: Anti-infectives    Start     Dose/Rate Route Frequency Ordered Stop   06/07/15 2359  ciprofloxacin (CIPRO) IVPB 400 mg     400 mg 200 mL/hr over 60 Minutes Intravenous Every 12 hours 06/07/15 1524     06/07/15 1500  metroNIDAZOLE (FLAGYL) IVPB 500 mg     500 mg 100 mL/hr over 60 Minutes Intravenous Every 8 hours 06/07/15 1459     06/07/15 1300  ciprofloxacin (CIPRO) IVPB 400 mg     400 mg 200 mL/hr over 60 Minutes Intravenous  Once 06/07/15 1258 06/07/15 1422   06/07/15 1300  metroNIDAZOLE (FLAGYL) IVPB 500 mg     500 mg 100 mL/hr over 60 Minutes Intravenous  Once 06/07/15 1258 06/07/15 1422       Assessment/Plan:  Acute diverticulitis of colon with perforation, without abscess. Seems volume depleted.  Have ordered another thousand cc bolus normal saline. Needs Foley catheter for better monitoring Nothing by mouth. We will attempt medical management for another 24 hours and reassess tomorrow. If she if she does not improve she will require laparotomy, bowel resection and temporary ostomy.  I discussed this with her in detail.  She is fearful of ostomy.  Sepsis.   Lactic acid normal.  Switch antibiotic to Invanz  Renal failure.,  Baseline unknown Suspect superimposed volume depletion, and IV bolus ordered and I have slightly increased her IV rate. Foley catheter.  DVT prophylaxis.  Switched from Lovenox to heparin.  Easier to reverse.  History hypertension. Currently holding meds due to hypertensive state.       LOS: 1 day    Carmaleta Youngers M 06/08/2015

## 2015-06-08 NOTE — Progress Notes (Addendum)
[facility] Progress Note   Deanna Aguilar [id number] DOB: 09-05-1956 DOA: 06/07/2015 PCP: [name], MD  Brief narrative: Deanna Aguilar is a 59 y.o. female with hypertension who presents with abdominal pain for 2 days. She points to the suprapubic area and states that it radiates from there to the right and left upper abdomen into her back. She feels pain when she tries to urinate as well. Pain quite severe. She has had nausea but no vomiting. She has had no appetite. She's felt hot and cold but has not checked her temperature. He has been taking Aleve and BC powders help control pain. Dilaudid given in the ER has helped her pain.   Subjective: Abdominal pain continues but Dilaudid is helping. No vomiting or diarrhea. No fever.  Assessment/Plan: Principal Problem:  Diverticulitis of colon with perforation/leukocytosis/peritonitis -The patient has been evaluated by surgery who feel that currently it can be medically managed for now- condition declining, hypotensive, acute renal failure now - She has received ciprofloxacin and Flagyl in the ER which I continued yesterday - surgery changed to Invanz this AM - follow step down unit-follow blood pressure closely  - Nothing by mouth-aggressive hydration - has received a liter of fluids in the ER- given 2 more L this AM for low BP  Active Problems:  Sepsis - Hypotension/ leukocytosis - BP in [id number] systolic this AM but Lactic acid normal - given 2 L NS this AM- 1 L by surgery and 1 L by myself - now BP improved to systolic 0000000 - continue at 125 cc/hr   Renal failure - worsening... Likely due to sepsis  -May also be related to NSAIDs and ACE inhibitor along with a prerenal state -Have no old lab work to determine if admission Cr was acute or chronic -Hold ACE inhibitor -Hydrate  Code Status: Full code Family Communication:  Disposition Plan: per sugery  DVT prophylaxis: Heparin Consultants:surgery   Procedures:  Antibiotics: Anti-infectives    Start     Dose/Rate Route Frequency Ordered Stop   06/08/15 0615  ertapenem (INVANZ) 1 g in sodium chloride 0.9 % 50 mL IVPB     1 g 100 mL/hr over 30 Minutes Intravenous Daily 06/08/15 0609     06/07/15 2359  ciprofloxacin (CIPRO) IVPB 400 mg  Status:  Discontinued     400 mg 200 mL/hr over 60 Minutes Intravenous Every 12 hours 06/07/15 1524 06/08/15 0609   06/07/15 1500  metroNIDAZOLE (FLAGYL) IVPB 500 mg  Status:  Discontinued     500 mg 100 mL/hr over 60 Minutes Intravenous Every 8 hours 06/07/15 1459 06/08/15 0609   06/07/15 1300  ciprofloxacin (CIPRO) IVPB 400 mg     400 mg 200 mL/hr over 60 Minutes Intravenous  Once 06/07/15 1258 06/07/15 1422   06/07/15 1300  metroNIDAZOLE (FLAGYL) IVPB 500 mg     500 mg 100 mL/hr over 60 Minutes Intravenous  Once 06/07/15 1258 06/07/15 1422      Objective: Filed Weights   06/07/15 1615  Weight: 121.1 kg (266 lb 15.6 oz)    Intake/Output Summary (Last 24 hours) at 06/08/15 0946 Last data filed at 06/08/15 0800  Gross per 24 hour  Intake 4841.25 ml  Output    250 ml  Net 4591.25 ml     Vitals Filed Vitals:   06/08/15 0600 06/08/15 0700 06/08/15 0800 06/08/15 0925  BP: 87/46  93/49 97/57  Pulse: 83 87 90 80  Temp:  99 F (37.2 C) 98.6 F (  21 C) 98.6 F (37 C)  TempSrc:      Resp: 17 20 20 18   Height:      Weight:      SpO2: 97% 96% 97% 96%    Exam:  General:  Pt is alert, not in acute distress  HEENT: No icterus, No thrush, oral mucosa moist  Cardiovascular: regular rate and rhythm, S1/S2 No murmur  Respiratory: clear to auscultation bilaterally   Abdomen: Soft, +Bowel sounds, non tender, non distended, no guarding  MSK: No LE edema, cyanosis or clubbing  Data Reviewed: Basic Metabolic Panel:  Recent Labs Lab 06/07/15 1018 06/08/15 0400  NA 135 135  K 3.7 3.8  CL 105 105  CO2 19* 21*  GLUCOSE 115* 107*  BUN 28* 32*  CREATININE 1.43* 2.25*  CALCIUM  8.7* 7.8*   Liver Function Tests: No results for input(s): AST, ALT, ALKPHOS, BILITOT, PROT, ALBUMIN in the last 168 hours. No results for input(s): LIPASE, AMYLASE in the last 168 hours. No results for input(s): AMMONIA in the last 168 hours. CBC:  Recent Labs Lab 06/07/15 1018 06/08/15 0400  WBC 14.8* 14.7*  NEUTROABS 12.8*  --   HGB 12.6 10.9*  HCT 37.5 33.5*  MCV 87.4 87.9  PLT 336 277   Cardiac Enzymes: No results for input(s): CKTOTAL, CKMB, CKMBINDEX, TROPONINI in the last 168 hours. BNP (last 3 results) No results for input(s): BNP in the last 8760 hours.  ProBNP (last 3 results) No results for input(s): PROBNP in the last 8760 hours.  CBG: No results for input(s): GLUCAP in the last 168 hours.  Recent Results (from the past 240 hour(s))  MRSA PCR Screening     Status: None   Collection Time: 06/07/15  4:13 PM  Result Value Ref Range Status   MRSA by PCR NEGATIVE NEGATIVE Final    Comment:        The GeneXpert MRSA Assay (FDA approved for NASAL specimens only), is one component of a comprehensive MRSA colonization surveillance program. It is not intended to diagnose MRSA infection nor to guide or monitor treatment for MRSA infections.      Studies: Ct Abdomen Pelvis W Contrast  06/07/2015   ADDENDUM REPORT: 06/07/2015 13:56  ADDENDUM: The urinary bladder is decompressed and difficult to exclude urinary bladder wall thickening or inflammation.   Electronically Signed   By: Markus Daft M.D.   On: 06/07/2015 13:56   06/07/2015   CLINICAL DATA:  Bilateral lower back pain.  EXAM: CT ABDOMEN AND PELVIS WITH CONTRAST  TECHNIQUE: Multidetector CT imaging of the abdomen and pelvis was performed using the standard protocol following bolus administration of intravenous contrast.  CONTRAST:  154mL OMNIPAQUE IOHEXOL 300 MG/ML  SOLN  COMPARISON:  None.  FINDINGS: There is bibasilar atelectasis, right side greater the left. There is free intraperitoneal air throughout the  upper abdomen. Pockets of free air in the lower abdomen. There is a small amount of perihepatic fluid. Normal appearance of the liver and gallbladder. Portal venous system is patent. Normal appearance of the pancreas, spleen and adrenal glands. Normal appearance of both kidneys.  Normal appearance of the stomach and duodenum. There is oral contrast in the distal small bowel and right colon. There is a markedly thickened loop of small bowel in the anterior lower abdomen on sequence 2, image 84. The distal small bowel and the right colon are distended. No significant wall thickening involving the terminal ileum. There is a small amount of fluid in the  right lower quadrant of the abdomen. Mild pericolonic inflammatory changes involving the distal descending colon and there are diverticula involving the descending colon and sigmoid colon. There is focal edema or fluid between the sigmoid colon and the thickened loop of small bowel on sequence 2, image 81. No discrete abscess collection.  Small amount of free fluid in the pelvis. No gross abnormality to the uterus or adnexa tissue. Small amount of free fluid along the anterior lower abdomen adjacent to the thickened loop of small bowel. Patient has a periumbilical hernia with mild mesenteric edema.  There is no significant lymphadenopathy in the abdomen or pelvis. However, there is concern for mild lymphadenopathy in the hilar regions bilaterally.  Facet arthropathy in the lower lumbar spine. No acute bone abnormality.  IMPRESSION: Study is positive for pneumoperitoneum. There is fluid and inflammatory changes throughout the lower abdomen and upper pelvis. A short segment of small bowel has wall thickening and compatible with enteritis. This loop of small bowel is adjacent to the sigmoid colon and there are inflammatory changes adjacent to the distal descending colon and sigmoid colon. Findings raise concern for underlying diverticulitis. These inflammatory changes could  represent a perforated diverticulitis with secondary small bowel enteritis. However, a primary small bowel enteritis cannot be excluded. In addition, there is dilatation of the distal small bowel and right colon which could be related to an ileus.  Concern for mild chest lymphadenopathy in the hilar regions. This could be further characterized with a post contrast chest CT.  These results were called by telephone at the time of interpretation on 06/07/2015 at 1:07 pm to Dr. Virgel Manifold , who verbally acknowledged these results.  Electronically Signed: By: Markus Daft M.D. On: 06/07/2015 13:15    Scheduled Meds:  Scheduled Meds: . ertapenem  1 g Intravenous Daily  . heparin subcutaneous  5,000 Units Subcutaneous 3 times per day   Continuous Infusions: . sodium chloride 125 mL/hr at 06/08/15 0859  . sodium chloride 0.9 % 1,000 mL infusion 150 mL/hr at 06/08/15 0706    Time spent on care of this patient: 35 min   [place], MD 06/08/2015, 9:46 AM  LOS: 1 day   [facility] Office  [phone] Pager - Text Page per [email or web address] If 7PM-7AM, please contact night-coverage [email or web address]

## 2015-06-08 NOTE — Progress Notes (Signed)
Date:  June 08, 2015 U.R. performed for needs and level of care. Will continue to follow for Case Management needs.  Velva Harman, RN, BSN, [place]   [phone]

## 2015-06-08 NOTE — Care Management Note (Signed)
Case Management Note  Patient Details  Name: Deanna Aguilar MRN: [id number] Date of Birth: 1956-08-04  Subjective/Objective:          Hypotension with abd pain, normally hypertensive patient          Action/Plan: home  Expected Discharge Date:                  Expected Discharge Plan:  Home/Self Care  In-House Referral:  NA  Discharge planning Services  CM Consult  Post Acute Care Choice:  NA Choice offered to:  NA  DME Arranged:  N/A DME Agency:  NA  HH Arranged:  NA HH Agency:  NA  Status of Service:  In process, will continue to follow  Medicare Important Message Given:    Date Medicare IM Given:    Medicare IM give by:    Date Additional Medicare IM Given:    Additional Medicare Important Message give by:     If discussed at [place] of Stay Meetings, dates discussed:    Additional Comments:  Leeroy Cha, RN 06/08/2015, 8:56 AM

## 2015-06-09 ENCOUNTER — Inpatient Hospital Stay (HOSPITAL_COMMUNITY): Payer: Medicaid Other | Admitting: Anesthesiology

## 2015-06-09 ENCOUNTER — Encounter (HOSPITAL_COMMUNITY): Payer: Self-pay | Admitting: Anesthesiology

## 2015-06-09 ENCOUNTER — Encounter (HOSPITAL_COMMUNITY)
Admission: EM | Disposition: A | Discharge status: Skilled Nursing Facility | Payer: Self-pay | Source: Home / Self Care | Attending: Family Medicine

## 2015-06-09 HISTORY — PX: COLON RESECTION: SHX5231

## 2015-06-09 LAB — CBC
HCT: 32.3 % — ABNORMAL LOW (ref 36.0–46.0)
HEMATOCRIT: 34.7 % — AB (ref 36.0–46.0)
HEMOGLOBIN: 10.5 g/dL — AB (ref 12.0–15.0)
HEMOGLOBIN: 11.5 g/dL — AB (ref 12.0–15.0)
MCH: 28.8 pg (ref 26.0–34.0)
MCH: 29.2 pg (ref 26.0–34.0)
MCHC: 32.5 g/dL (ref 30.0–36.0)
MCHC: 33.1 g/dL (ref 30.0–36.0)
MCV: 88.5 fL (ref 78.0–100.0)
MCV: 88.1 fL (ref 78.0–100.0)
PLATELETS: 355 10*3/uL (ref 150–400)
Platelets: 374 10*3/uL (ref 150–400)
RBC: 3.65 MIL/uL — AB (ref 3.87–5.11)
RBC: 3.94 MIL/uL (ref 3.87–5.11)
RDW: 14.5 % (ref 11.5–15.5)
RDW: 14.6 % (ref 11.5–15.5)
WBC: 13.4 10*3/uL — ABNORMAL HIGH (ref 4.0–10.5)
WBC: 14.9 10*3/uL — AB (ref 4.0–10.5)

## 2015-06-09 LAB — BASIC METABOLIC PANEL
Anion gap: 8 (ref 5–15)
Anion gap: 8 (ref 5–15)
Anion gap: 8 (ref 5–15)
BUN: 27 mg/dL — ABNORMAL HIGH (ref 6–20)
BUN: 23 mg/dL — ABNORMAL HIGH (ref 6–20)
BUN: 25 mg/dL — ABNORMAL HIGH (ref 6–20)
CALCIUM: 7.9 mg/dL — AB (ref 8.9–10.3)
CO2: 19 mmol/L — AB (ref 22–32)
CO2: 20 mmol/L — ABNORMAL LOW (ref 22–32)
CO2: 19 mmol/L — ABNORMAL LOW (ref 22–32)
Calcium: 7.8 mg/dL — ABNORMAL LOW (ref 8.9–10.3)
Calcium: 7.6 mg/dL — ABNORMAL LOW (ref 8.9–10.3)
Chloride: 113 mmol/L — ABNORMAL HIGH (ref 101–111)
Chloride: 113 mmol/L — ABNORMAL HIGH (ref 101–111)
Chloride: 114 mmol/L — ABNORMAL HIGH (ref 101–111)
Creatinine, Ser: 1.4 mg/dL — ABNORMAL HIGH (ref 0.44–1.00)
Creatinine, Ser: 1.25 mg/dL — ABNORMAL HIGH (ref 0.44–1.00)
Creatinine, Ser: 1.25 mg/dL — ABNORMAL HIGH (ref 0.44–1.00)
GFR calc Af Amer: 47 mL/min — ABNORMAL LOW (ref >60)
GFR calc Af Amer: 54 mL/min — ABNORMAL LOW (ref >60)
GFR calc non Af Amer: 47 mL/min — ABNORMAL LOW (ref >60)
GFR calc non Af Amer: 47 mL/min — ABNORMAL LOW (ref >60)
GFR, EST AFRICAN AMERICAN: 54 mL/min — AB (ref >60)
GFR, EST NON AFRICAN AMERICAN: 41 mL/min — AB (ref >60)
GLUCOSE: 110 mg/dL — AB (ref 65–99)
Glucose, Bld: 91 mg/dL (ref 65–99)
Glucose, Bld: 137 mg/dL — ABNORMAL HIGH (ref 65–99)
POTASSIUM: 4.1 mmol/L (ref 3.5–5.1)
Potassium: 3.7 mmol/L (ref 3.5–5.1)
Potassium: 4.3 mmol/L (ref 3.5–5.1)
SODIUM: 140 mmol/L (ref 135–145)
Sodium: 141 mmol/L (ref 135–145)
Sodium: 141 mmol/L (ref 135–145)

## 2015-06-09 LAB — LACTIC ACID, PLASMA: LACTIC ACID, VENOUS: 0.8 mmol/L (ref 0.5–2.0)

## 2015-06-09 LAB — MAGNESIUM
Magnesium: 1.8 mg/dL (ref 1.7–2.4)
Magnesium: 1.9 mg/dL (ref 1.7–2.4)

## 2015-06-09 LAB — TROPONIN I: Troponin I: <0.03 ng/mL (ref <0.031)

## 2015-06-09 SURGERY — COLON RESECTION
Anesthesia: General

## 2015-06-09 MED ORDER — KETAMINE HCL 10 MG/ML IJ SOLN
INTRAMUSCULAR | Status: DC | PRN
  Administered 2015-06-09: 10 mg via INTRAVENOUS
  Administered 2015-06-09: 20 mg via INTRAVENOUS

## 2015-06-09 MED ORDER — LABETALOL HCL 5 MG/ML IV SOLN
5.0000 mg | INTRAVENOUS | Status: DC | PRN
  Administered 2015-06-09 (×2): 5 mg via INTRAVENOUS

## 2015-06-09 MED ORDER — FENTANYL CITRATE (PF) 100 MCG/2ML IJ SOLN
INTRAMUSCULAR | Status: DC | PRN
  Administered 2015-06-09 (×2): 50 ug via INTRAVENOUS

## 2015-06-09 MED ORDER — DEXAMETHASONE SODIUM PHOSPHATE 10 MG/ML IJ SOLN
INTRAMUSCULAR | Status: AC
  Filled 2015-06-09: qty 1

## 2015-06-09 MED ORDER — LIDOCAINE HCL (CARDIAC) 20 MG/ML IV SOLN
INTRAVENOUS | Status: DC | PRN
  Administered 2015-06-09: 25 mg via INTRATRACHEAL
  Administered 2015-06-09: 100 mg via INTRAVENOUS

## 2015-06-09 MED ORDER — PROPOFOL 10 MG/ML IV BOLUS
INTRAVENOUS | Status: AC
  Filled 2015-06-09: qty 20

## 2015-06-09 MED ORDER — NEOSTIGMINE METHYLSULFATE 10 MG/10ML IV SOLN
INTRAVENOUS | Status: AC
  Filled 2015-06-09: qty 1

## 2015-06-09 MED ORDER — GLYCOPYRROLATE 0.2 MG/ML IJ SOLN
INTRAMUSCULAR | Status: AC
  Filled 2015-06-09: qty 1

## 2015-06-09 MED ORDER — HYDROMORPHONE HCL 1 MG/ML IJ SOLN
1.0000 mg | INTRAMUSCULAR | Status: DC | PRN
  Administered 2015-06-09 – 2015-06-10 (×6): 1 mg via INTRAVENOUS
  Administered 2015-06-10 (×2): 2 mg via INTRAVENOUS
  Administered 2015-06-10 (×2): 1 mg via INTRAVENOUS
  Administered 2015-06-10 – 2015-06-12 (×12): 2 mg via INTRAVENOUS
  Administered 2015-06-12: 1 mg via INTRAVENOUS
  Administered 2015-06-12: 2 mg via INTRAVENOUS
  Administered 2015-06-12: 1 mg via INTRAVENOUS
  Administered 2015-06-13 – 2015-06-14 (×15): 2 mg via INTRAVENOUS
  Administered 2015-06-14: 1 mg via INTRAVENOUS
  Administered 2015-06-15 (×2): 2 mg via INTRAVENOUS
  Administered 2015-06-15: 1 mg via INTRAVENOUS
  Administered 2015-06-15 (×3): 2 mg via INTRAVENOUS
  Administered 2015-06-15: 1 mg via INTRAVENOUS
  Administered 2015-06-15 – 2015-06-16 (×2): 2 mg via INTRAVENOUS
  Administered 2015-06-16: 1 mg via INTRAVENOUS
  Administered 2015-06-16 (×2): 2 mg via INTRAVENOUS
  Administered 2015-06-16: 1 mg via INTRAVENOUS
  Administered 2015-06-16 – 2015-06-17 (×3): 2 mg via INTRAVENOUS
  Filled 2015-06-09 (×6): qty 2
  Filled 2015-06-09: qty 1
  Filled 2015-06-09 (×4): qty 2
  Filled 2015-06-09: qty 1
  Filled 2015-06-09 (×12): qty 2
  Filled 2015-06-09: qty 1
  Filled 2015-06-09: qty 2
  Filled 2015-06-09: qty 1
  Filled 2015-06-09 (×2): qty 2
  Filled 2015-06-09: qty 1
  Filled 2015-06-09 (×4): qty 2
  Filled 2015-06-09: qty 1
  Filled 2015-06-09: qty 2
  Filled 2015-06-09 (×2): qty 1
  Filled 2015-06-09 (×4): qty 2
  Filled 2015-06-09: qty 1
  Filled 2015-06-09 (×2): qty 2
  Filled 2015-06-09: qty 1
  Filled 2015-06-09 (×11): qty 2
  Filled 2015-06-09: qty 1

## 2015-06-09 MED ORDER — PROPOFOL 10 MG/ML IV BOLUS
INTRAVENOUS | Status: DC | PRN
  Administered 2015-06-09: 200 mg via INTRAVENOUS

## 2015-06-09 MED ORDER — GLYCOPYRROLATE 0.2 MG/ML IJ SOLN
INTRAMUSCULAR | Status: AC
  Filled 2015-06-09: qty 3

## 2015-06-09 MED ORDER — ARTIFICIAL TEARS OP OINT
TOPICAL_OINTMENT | OPHTHALMIC | Status: AC
  Filled 2015-06-09: qty 3.5

## 2015-06-09 MED ORDER — SUFENTANIL CITRATE 50 MCG/ML IV SOLN
INTRAVENOUS | Status: AC
  Filled 2015-06-09: qty 1

## 2015-06-09 MED ORDER — MIDAZOLAM HCL 2 MG/2ML IJ SOLN
INTRAMUSCULAR | Status: AC
  Filled 2015-06-09: qty 2

## 2015-06-09 MED ORDER — GLYCOPYRROLATE 0.2 MG/ML IJ SOLN
INTRAMUSCULAR | Status: DC | PRN
  Administered 2015-06-09: .8 mg via INTRAVENOUS

## 2015-06-09 MED ORDER — ROCURONIUM BROMIDE 100 MG/10ML IV SOLN
INTRAVENOUS | Status: DC | PRN
  Administered 2015-06-09: 70 mg via INTRAVENOUS

## 2015-06-09 MED ORDER — HYDROMORPHONE HCL 2 MG/ML IJ SOLN
INTRAMUSCULAR | Status: AC
  Filled 2015-06-09: qty 1

## 2015-06-09 MED ORDER — ONDANSETRON HCL 4 MG/2ML IJ SOLN
INTRAMUSCULAR | Status: DC | PRN
  Administered 2015-06-09: 4 mg via INTRAVENOUS

## 2015-06-09 MED ORDER — ONDANSETRON HCL 4 MG/2ML IJ SOLN
INTRAMUSCULAR | Status: AC
  Filled 2015-06-09: qty 2

## 2015-06-09 MED ORDER — SODIUM CHLORIDE 0.9 % IV BOLUS (SEPSIS)
500.0000 mL | Freq: Once | INTRAVENOUS | Status: AC
  Administered 2015-06-09: 500 mL via INTRAVENOUS

## 2015-06-09 MED ORDER — PHENYLEPHRINE HCL 10 MG/ML IJ SOLN
INTRAMUSCULAR | Status: DC | PRN
  Administered 2015-06-09 (×2): 80 ug via INTRAVENOUS

## 2015-06-09 MED ORDER — SUFENTANIL CITRATE 50 MCG/ML IV SOLN
INTRAVENOUS | Status: DC | PRN
  Administered 2015-06-09: 5 ug via INTRAVENOUS
  Administered 2015-06-09 (×2): 10 ug via INTRAVENOUS
  Administered 2015-06-09: 5 ug via INTRAVENOUS
  Administered 2015-06-09: 15 ug via INTRAVENOUS
  Administered 2015-06-09: 5 ug via INTRAVENOUS

## 2015-06-09 MED ORDER — LIDOCAINE HCL (CARDIAC) 20 MG/ML IV SOLN
INTRAVENOUS | Status: AC
  Filled 2015-06-09: qty 5

## 2015-06-09 MED ORDER — 0.9 % SODIUM CHLORIDE (POUR BTL) OPTIME
TOPICAL | Status: DC | PRN
  Administered 2015-06-09: 4000 mL

## 2015-06-09 MED ORDER — PROMETHAZINE HCL 25 MG/ML IJ SOLN
12.5000 mg | Freq: Once | INTRAMUSCULAR | Status: AC
  Administered 2015-06-09: 12.5 mg via INTRAVENOUS
  Filled 2015-06-09: qty 1

## 2015-06-09 MED ORDER — ONDANSETRON HCL 4 MG/2ML IJ SOLN
4.0000 mg | Freq: Once | INTRAMUSCULAR | Status: AC
  Administered 2015-06-09: 4 mg via INTRAVENOUS

## 2015-06-09 MED ORDER — HYDROMORPHONE HCL 1 MG/ML IJ SOLN
INTRAMUSCULAR | Status: AC
  Filled 2015-06-09: qty 1

## 2015-06-09 MED ORDER — MAGNESIUM SULFATE 2 GM/50ML IV SOLN
2.0000 g | Freq: Once | INTRAVENOUS | Status: AC
  Administered 2015-06-09: 2 g via INTRAVENOUS
  Filled 2015-06-09: qty 50

## 2015-06-09 MED ORDER — SODIUM CHLORIDE 0.9 % IJ SOLN
INTRAMUSCULAR | Status: AC
  Filled 2015-06-09: qty 10

## 2015-06-09 MED ORDER — NEOSTIGMINE METHYLSULFATE 10 MG/10ML IV SOLN
INTRAVENOUS | Status: DC | PRN
  Administered 2015-06-09: 5 mg via INTRAVENOUS

## 2015-06-09 MED ORDER — LACTATED RINGERS IV SOLN
INTRAVENOUS | Status: AC
  Administered 2015-06-09 (×3): via INTRAVENOUS

## 2015-06-09 MED ORDER — KETAMINE HCL 10 MG/ML IJ SOLN
INTRAMUSCULAR | Status: AC
  Filled 2015-06-09: qty 1

## 2015-06-09 MED ORDER — DEXAMETHASONE SODIUM PHOSPHATE 10 MG/ML IJ SOLN
INTRAMUSCULAR | Status: DC | PRN
  Administered 2015-06-09: 10 mg via INTRAVENOUS

## 2015-06-09 MED ORDER — MIDAZOLAM HCL 5 MG/5ML IJ SOLN
INTRAMUSCULAR | Status: DC | PRN
  Administered 2015-06-09 (×2): 1 mg via INTRAVENOUS
  Administered 2015-06-09: 0.5 mg via INTRAVENOUS

## 2015-06-09 MED ORDER — ROCURONIUM BROMIDE 100 MG/10ML IV SOLN
INTRAVENOUS | Status: AC
  Filled 2015-06-09: qty 1

## 2015-06-09 MED ORDER — FENTANYL CITRATE (PF) 100 MCG/2ML IJ SOLN
INTRAMUSCULAR | Status: AC
  Filled 2015-06-09: qty 2

## 2015-06-09 MED ORDER — METOPROLOL TARTRATE 1 MG/ML IV SOLN
5.0000 mg | Freq: Once | INTRAVENOUS | Status: AC
  Administered 2015-06-09: 5 mg via INTRAVENOUS

## 2015-06-09 MED ORDER — LABETALOL HCL 5 MG/ML IV SOLN
INTRAVENOUS | Status: AC
  Administered 2015-06-09: 5 mg
  Filled 2015-06-09: qty 4

## 2015-06-09 MED ORDER — SODIUM CHLORIDE 0.9 % IV BOLUS (SEPSIS)
1000.0000 mL | Freq: Once | INTRAVENOUS | Status: AC
  Administered 2015-06-09: 1000 mL via INTRAVENOUS

## 2015-06-09 MED ORDER — DILTIAZEM HCL 25 MG/5ML IV SOLN
5.0000 mg | Freq: Once | INTRAVENOUS | Status: AC
  Administered 2015-06-09: 5 mg via INTRAVENOUS
  Filled 2015-06-09: qty 5

## 2015-06-09 MED ORDER — PROMETHAZINE HCL 25 MG/ML IJ SOLN
12.5000 mg | Freq: Four times a day (QID) | INTRAMUSCULAR | Status: DC | PRN
  Administered 2015-06-09 – 2015-06-11 (×5): 12.5 mg via INTRAVENOUS
  Filled 2015-06-09 (×7): qty 1

## 2015-06-09 MED ORDER — METOPROLOL TARTRATE 1 MG/ML IV SOLN
INTRAVENOUS | Status: AC
  Filled 2015-06-09: qty 5

## 2015-06-09 MED ORDER — HYDROMORPHONE HCL 1 MG/ML IJ SOLN
0.2500 mg | INTRAMUSCULAR | Status: DC | PRN
  Administered 2015-06-09 (×4): 0.5 mg via INTRAVENOUS

## 2015-06-09 MED ORDER — PROMETHAZINE HCL 25 MG/ML IJ SOLN
INTRAMUSCULAR | Status: AC
  Administered 2015-06-09: 6.25 mg
  Filled 2015-06-09: qty 1

## 2015-06-09 MED ORDER — SUCCINYLCHOLINE CHLORIDE 20 MG/ML IJ SOLN
INTRAMUSCULAR | Status: DC | PRN
  Administered 2015-06-09: 120 mg via INTRAVENOUS

## 2015-06-09 SURGICAL SUPPLY — 52 items
BLADE EXTENDED COATED 6.5IN (ELECTRODE) ×3 IMPLANT
BNDG GAUZE ELAST 4 BULKY (GAUZE/BANDAGES/DRESSINGS) ×4 IMPLANT
CHLORAPREP W/TINT 26ML (MISCELLANEOUS) ×3 IMPLANT
CLAMP POUCH DRAINAGE QUIET (OSTOMY) ×2 IMPLANT
COUNTER NEEDLE 20 DBL MAG RED (NEEDLE) ×1 IMPLANT
COVER MAYO STAND STRL (DRAPES) ×9 IMPLANT
DRAPE LAPAROSCOPIC ABDOMINAL (DRAPES) ×3 IMPLANT
DRAPE SURG IRRIG POUCH 19X23 (DRAPES) ×3 IMPLANT
DRAPE WARM FLUID 44X44 (DRAPE) IMPLANT
DRSG OPSITE POSTOP 4X10 (GAUZE/BANDAGES/DRESSINGS) IMPLANT
DRSG OPSITE POSTOP 4X6 (GAUZE/BANDAGES/DRESSINGS) IMPLANT
DRSG OPSITE POSTOP 4X8 (GAUZE/BANDAGES/DRESSINGS) IMPLANT
DRSG PAD ABDOMINAL 8X10 ST (GAUZE/BANDAGES/DRESSINGS) ×4 IMPLANT
ELECT PENCIL ROCKER SW 15FT (MISCELLANEOUS) ×2 IMPLANT
ELECT REM PT RETURN 9FT ADLT (ELECTROSURGICAL) ×3
ELECTRODE REM PT RTRN 9FT ADLT (ELECTROSURGICAL) ×1 IMPLANT
GAUZE SPONGE 4X4 12PLY STRL (GAUZE/BANDAGES/DRESSINGS) ×1 IMPLANT
GLOVE EUDERMIC 7 POWDERFREE (GLOVE) ×22 IMPLANT
GOWN STRL REUS W/TWL XL LVL3 (GOWN DISPOSABLE) ×18 IMPLANT
LEGGING LITHOTOMY PAIR STRL (DRAPES) ×1 IMPLANT
LIGASURE IMPACT 36 18CM CVD LR (INSTRUMENTS) ×3 IMPLANT
NS IRRIG 1000ML POUR BTL (IV SOLUTION) ×12 IMPLANT
PACK COLON (CUSTOM PROCEDURE TRAY) ×3 IMPLANT
PAD POSITIONING PINK XL (MISCELLANEOUS) ×3 IMPLANT
PEN SKIN MARKING BROAD (MISCELLANEOUS) ×3 IMPLANT
POUCH OSTOMY 2  H (OSTOMY) ×2 IMPLANT
RELOAD PROXIMATE 75MM BLUE (ENDOMECHANICALS) ×3 IMPLANT
RELOAD STAPLE 75 3.8 BLU REG (ENDOMECHANICALS) IMPLANT
RETAINER VISCERA MED (MISCELLANEOUS) ×2 IMPLANT
SPONGE LAP 18X18 X RAY DECT (DISPOSABLE) ×9 IMPLANT
STAPLER PROXIMATE 75MM BLUE (STAPLE) ×2 IMPLANT
STAPLER VISISTAT 35W (STAPLE) ×3 IMPLANT
SUT NOV 1 T60/GS (SUTURE) IMPLANT
SUT NOVA 1 T20/GS 25DT (SUTURE) ×2 IMPLANT
SUT NOVA NAB DX-16 0-1 5-0 T12 (SUTURE) IMPLANT
SUT NOVA T20/GS 25 (SUTURE) IMPLANT
SUT PDS AB 1 CTX 36 (SUTURE) IMPLANT
SUT PDS AB 1 TP1 96 (SUTURE) ×4 IMPLANT
SUT PROLENE 0 SH 30 (SUTURE) ×2 IMPLANT
SUT PROLENE 2 0 KS (SUTURE) IMPLANT
SUT PROLENE 2 0 SH DA (SUTURE) IMPLANT
SUT SILK 0 SH 30 (SUTURE) ×12 IMPLANT
SUT SILK 2 0 (SUTURE) ×3
SUT SILK 2 0 SH CR/8 (SUTURE) ×3 IMPLANT
SUT SILK 2-0 18XBRD TIE 12 (SUTURE) ×1 IMPLANT
SUT SILK 3 0 (SUTURE) ×3
SUT SILK 3 0 SH CR/8 (SUTURE) ×3 IMPLANT
SUT SILK 3-0 18XBRD TIE 12 (SUTURE) ×1 IMPLANT
SUT VIC AB 3-0 SH 18 (SUTURE) ×2 IMPLANT
TOWEL OR 17X26 10 PK STRL BLUE (TOWEL DISPOSABLE) ×5 IMPLANT
TOWEL OR NON WOVEN STRL DISP B (DISPOSABLE) ×3 IMPLANT
TRAY FOLEY W/METER SILVER 14FR (SET/KITS/TRAYS/PACK) ×1 IMPLANT

## 2015-06-09 NOTE — Progress Notes (Signed)
Lt hand pulled out when pt moved arm. SIte U, catheter intact.   Rt AC PIV accessed.

## 2015-06-09 NOTE — Transfer of Care (Signed)
Immediate Anesthesia Transfer of Care Note  Patient: Deanna Aguilar  Procedure(s) Performed: Procedure(s): SIGMOID COLECTOMY, COLOSTOMY, TAKE DOWN SPLENIC FLECTURE (N/A)  Patient Location: PACU  Anesthesia Type:General  Level of Consciousness: awake, alert , oriented and patient cooperative  Airway & Oxygen Therapy: Patient Spontanous Breathing and Patient connected to face mask oxygen  Post-op Assessment: Report given to RN and Post -op Vital signs reviewed and stable  Post vital signs: stable  Last Vitals:  Filed Vitals:   06/09/15 1333  BP: 109/52  Pulse:   Temp:   Resp:     Complications: No apparent anesthesia complications

## 2015-06-09 NOTE — Anesthesia Procedure Notes (Signed)
Procedure Name: Intubation Date/Time: 06/09/2015 11:17 AM Performed by: Lissa Morales Pre-anesthesia Checklist: Patient identified, Emergency Drugs available, Suction available and Patient being monitored Patient Re-evaluated:Patient Re-evaluated prior to inductionOxygen Delivery Method: Circle System Utilized Preoxygenation: Pre-oxygenation with 100% oxygen Intubation Type: IV induction Ventilation: Mask ventilation without difficulty Laryngoscope Size: Mac and 4 Grade View: Grade III Tube type: Oral Tube size: 7.5 mm Number of attempts: 1 Airway Equipment and Method: Stylet and Oral airway Placement Confirmation: ETT inserted through vocal cords under direct vision,  positive ETCO2 and breath sounds checked- equal and bilateral Secured at: 21 cm Tube secured with: Tape Dental Injury: Teeth and Oropharynx as per pre-operative assessment  Difficulty Due To: Difficult Airway- due to large tongue, Difficult Airway- due to anterior larynx, Difficult Airway- due to limited oral opening and Difficult Airway- due to dentition

## 2015-06-09 NOTE — Progress Notes (Signed)
At [place], patient was noted to have an increase in heart rate from 80's NSR to 140s Afib/ flutter.  The rate has remained 110-120's.  12 lead EKG showed SVT/ Alfutter with AV-Block/ Afib.  Pt SBP 90-110 and sats 95% on O2 2L.  Patient denies chest pain.  Lamar Blinks NP notified.  Orders for troponins and magnesium levels.  Asked nursing to call if patient sustains a heart rate in the 130s and up.  Continue to watch with no other orders received.

## 2015-06-09 NOTE — Progress Notes (Signed)
Ms. Lores seemed really sleepy and when asked said she had surgery today and nurse confirmed. My visit was brief. She appreciated the follow-up visit. Chaplain Judi Ihnen Holder   06/09/15 2000  Clinical Encounter Type  Visited With Patient

## 2015-06-09 NOTE — Anesthesia Postprocedure Evaluation (Signed)
  Anesthesia Post-op Note  Patient: Deanna Aguilar  Procedure(s) Performed: Procedure(s) (LRB): SIGMOID COLECTOMY, COLOSTOMY, TAKE DOWN SPLENIC FLECTURE (N/A)  Patient Location: PACU  Anesthesia Type: general  Level of Consciousness: awake and alert   Airway and Oxygen Therapy: Patient Spontanous Breathing  Post-op Pain: mild  Post-op Assessment: Post-op Vital signs reviewed, Patient's Cardiovascular Status Stable, Respiratory Function Stable, Patent Airway and No signs of Nausea or vomiting. Nausea has been treated with phenergan in the PACU. Pain control is difficult but she is drifting off to sleep frequently. Will be transferred back to ICU per plan.  Last Vitals:  Filed Vitals:   06/09/15 1430  BP: 136/70  Pulse: 98  Temp:   Resp: 18    Post-op Vital Signs: stable   Complications: No apparent anesthesia complications

## 2015-06-09 NOTE — Progress Notes (Addendum)
Subjective: Still having significant abdominal pain.  No stool or flatus.  Nauseated but no vomiting. Urine output better. Creatinine down to 1.40.  Potassium 3.7.  WBC 13,400.  Hemoglobin 10.5.  Episode of transient A. fib and flutter.  In sinus rhythm currently.  Troponins are negative.  Objective: Vital signs in last 24 hours: Temp:  [98.6 F (37 C)-100.6 F (38.1 C)] 99.9 F (37.7 C) (07/06 0530) Pulse Rate:  [44-140] 87 (07/06 0530) Resp:  [0-29] 15 (07/06 0530) BP: (87-111)/(37-71) 88/52 mmHg (07/06 0530) SpO2:  [94 %-100 %] 97 % (07/06 0530) Last BM Date: 06/06/15  Intake/Output from previous day: 07/05 0701 - 07/06 0700 In: 3635 [I.V.:2635; IV Piggyback:1000] Out: 1125 [Urine:1125] Intake/Output this shift: Total I/O In: 1000 [I.V.:1000] Out: 375 [Urine:375]  General appearance: Alert.  Cooperative.  Moderate distress.  Mental status normal.  Fearful. GI: Significant obesity.  Hypoactive bowel sounds.  Diffuse tenderness and diffuse guarding.  Lab Results:   Recent Labs  06/08/15 0400 06/09/15 0335  WBC 14.7* 13.4*  HGB 10.9* 10.5*  HCT 33.5* 32.3*  PLT 277 355   BMET  Recent Labs  06/08/15 0400 06/09/15 0335  NA 135 140  K 3.8 3.7  CL 105 113*  CO2 21* 19*  GLUCOSE 107* 91  BUN 32* 27*  CREATININE 2.25* 1.40*  CALCIUM 7.8* 7.9*   PT/INR No results for input(s): LABPROT, INR in the last 72 hours. ABG No results for input(s): PHART, HCO3 in the last 72 hours.  Invalid input(s): PCO2, PO2  Studies/Results: Ct Abdomen Pelvis W Contrast  06/07/2015   ADDENDUM REPORT: 06/07/2015 13:56  ADDENDUM: The urinary bladder is decompressed and difficult to exclude urinary bladder wall thickening or inflammation.   Electronically Signed   By: Markus Daft M.D.   On: 06/07/2015 13:56   06/07/2015   CLINICAL DATA:  Bilateral lower back pain.  EXAM: CT ABDOMEN AND PELVIS WITH CONTRAST  TECHNIQUE: Multidetector CT imaging of the abdomen and pelvis was performed  using the standard protocol following bolus administration of intravenous contrast.  CONTRAST:  172mL OMNIPAQUE IOHEXOL 300 MG/ML  SOLN  COMPARISON:  None.  FINDINGS: There is bibasilar atelectasis, right side greater the left. There is free intraperitoneal air throughout the upper abdomen. Pockets of free air in the lower abdomen. There is a small amount of perihepatic fluid. Normal appearance of the liver and gallbladder. Portal venous system is patent. Normal appearance of the pancreas, spleen and adrenal glands. Normal appearance of both kidneys.  Normal appearance of the stomach and duodenum. There is oral contrast in the distal small bowel and right colon. There is a markedly thickened loop of small bowel in the anterior lower abdomen on sequence 2, image 84. The distal small bowel and the right colon are distended. No significant wall thickening involving the terminal ileum. There is a small amount of fluid in the right lower quadrant of the abdomen. Mild pericolonic inflammatory changes involving the distal descending colon and there are diverticula involving the descending colon and sigmoid colon. There is focal edema or fluid between the sigmoid colon and the thickened loop of small bowel on sequence 2, image 81. No discrete abscess collection.  Small amount of free fluid in the pelvis. No gross abnormality to the uterus or adnexa tissue. Small amount of free fluid along the anterior lower abdomen adjacent to the thickened loop of small bowel. Patient has a periumbilical hernia with mild mesenteric edema.  There is no significant lymphadenopathy in  the abdomen or pelvis. However, there is concern for mild lymphadenopathy in the hilar regions bilaterally.  Facet arthropathy in the lower lumbar spine. No acute bone abnormality.  IMPRESSION: Study is positive for pneumoperitoneum. There is fluid and inflammatory changes throughout the lower abdomen and upper pelvis. A short segment of small bowel has wall  thickening and compatible with enteritis. This loop of small bowel is adjacent to the sigmoid colon and there are inflammatory changes adjacent to the distal descending colon and sigmoid colon. Findings raise concern for underlying diverticulitis. These inflammatory changes could represent a perforated diverticulitis with secondary small bowel enteritis. However, a primary small bowel enteritis cannot be excluded. In addition, there is dilatation of the distal small bowel and right colon which could be related to an ileus.  Concern for mild chest lymphadenopathy in the hilar regions. This could be further characterized with a post contrast chest CT.  These results were called by telephone at the time of interpretation on 06/07/2015 at 1:07 pm to Dr. Virgel Manifold , who verbally acknowledged these results.  Electronically Signed: By: Markus Daft M.D. On: 06/07/2015 13:15    Anti-infectives: Anti-infectives    Start     Dose/Rate Route Frequency Ordered Stop   06/08/15 0615  ertapenem (INVANZ) 1 g in sodium chloride 0.9 % 50 mL IVPB     1 g 100 mL/hr over 30 Minutes Intravenous Daily 06/08/15 0609     06/07/15 2359  ciprofloxacin (CIPRO) IVPB 400 mg  Status:  Discontinued     400 mg 200 mL/hr over 60 Minutes Intravenous Every 12 hours 06/07/15 1524 06/08/15 0609   06/07/15 1500  metroNIDAZOLE (FLAGYL) IVPB 500 mg  Status:  Discontinued     500 mg 100 mL/hr over 60 Minutes Intravenous Every 8 hours 06/07/15 1459 06/08/15 0609   06/07/15 1300  ciprofloxacin (CIPRO) IVPB 400 mg     400 mg 200 mL/hr over 60 Minutes Intravenous  Once 06/07/15 1258 06/07/15 1422   06/07/15 1300  metroNIDAZOLE (FLAGYL) IVPB 500 mg     500 mg 100 mL/hr over 60 Minutes Intravenous  Once 06/07/15 1258 06/07/15 1422      Assessment/Plan:    Acute diverticulitis of colon with perforation, without abscess. Volume depletion has improved, still has clinical evidence of diffuse peritonitis. I have advised her to undergo  laparotomy, colon resection with colostomy, and indicated procedures today. She agrees I discussed the indications, details, techniques, and numerous risk of the surgery with her.  She's aware the risk of bleeding, infection, wound hernia, wound dehiscence, difficulty with colostomy due to obesity, injury to adjacent organs such as ureter or major vascular structures with major reconstructive surgery, cardiac pulmonary and thromboembolic problems.  She understands these issues well.  This time all of her questions are answered.  She agrees with this plan.  Sepsis.  Lactic acid normal. On Invanz  Transient episode SVT.  Resolved.  Management per internal medicine.  Renal failure.,  Unknown.  Creatinine better following volume expansion..  Continue Foley catheter.  DVT prophylaxis. Switched from Lovenox to heparin. Easier to reverse.  History hypertension. Currently holding meds due to hypertensive state.   LOS: 2 days    Deanna Aguilar M 06/09/2015

## 2015-06-09 NOTE — Progress Notes (Signed)
[facility] Progress Note   Deanna Aguilar [id number] DOB: June 20, 1956 DOA: 06/07/2015 PCP: [name], MD  Brief narrative: Deanna Aguilar is a 59 y.o. female with hypertension who presents with abdominal pain for 2 days. She points to the suprapubic area and states that it radiates from there to the right and left upper abdomen into her back. She feels pain when she tries to urinate as well. Pain quite severe. She has had nausea but no vomiting. She has had no appetite. She's felt hot and cold but has not checked her temperature. He has been taking Aleve and BC powders help control pain.    Subjective: Pt has no new complaints. To OR today per our discussion.  Assessment/Plan: Principal Problem:  Diverticulitis of colon with perforation/leukocytosis/peritonitis -General surgery on board and assisting - continue IV antibiotic (invanz) - supportive therapy  Active Problems:  Sepsis - Hypotension/ leukocytosis - Continue current normal saline infusion rate, blood pressure stable currently. - continue Invanz  Renal failure - improving after IV fluid infusion and continue IV antibiotic administration. -May also be related to NSAIDs and ACE inhibitor along with a prerenal state -Hold ACE inhibitor  Code Status: Full code Family Communication:  Disposition Plan: per sugery  DVT prophylaxis: Heparin Consultants:surgery  Procedures:  Antibiotics: Anti-infectives    Start     Dose/Rate Route Frequency Ordered Stop   06/08/15 0615  ertapenem (INVANZ) 1 g in sodium chloride 0.9 % 50 mL IVPB     1 g 100 mL/hr over 30 Minutes Intravenous Daily 06/08/15 0609     06/07/15 2359  ciprofloxacin (CIPRO) IVPB 400 mg  Status:  Discontinued     400 mg 200 mL/hr over 60 Minutes Intravenous Every 12 hours 06/07/15 1524 06/08/15 0609   06/07/15 1500  metroNIDAZOLE (FLAGYL) IVPB 500 mg  Status:  Discontinued     500 mg 100 mL/hr over 60 Minutes Intravenous Every 8 hours  06/07/15 1459 06/08/15 0609   06/07/15 1300  ciprofloxacin (CIPRO) IVPB 400 mg     400 mg 200 mL/hr over 60 Minutes Intravenous  Once 06/07/15 1258 06/07/15 1422   06/07/15 1300  metroNIDAZOLE (FLAGYL) IVPB 500 mg     500 mg 100 mL/hr over 60 Minutes Intravenous  Once 06/07/15 1258 06/07/15 1422      Objective: Filed Weights   06/07/15 1615  Weight: 121.1 kg (266 lb 15.6 oz)    Intake/Output Summary (Last 24 hours) at 06/09/15 1007 Last data filed at 06/09/15 0923  Gross per 24 hour  Intake   3550 ml  Output   1600 ml  Net   1950 ml     Vitals Filed Vitals:   06/09/15 0600 06/09/15 0700 06/09/15 0800 06/09/15 0900  BP: 87/49 128/73 117/58 126/65  Pulse: 84 86 85 80  Temp: 99.9 F (37.7 C) 99.7 F (37.6 C) 99.3 F (37.4 C) 99.5 F (37.5 C)  TempSrc:  Core (Comment) Core (Comment) Core (Comment)  Resp: 15 20 18 15   Height:      Weight:      SpO2: 97% 96% 97% 97%    Exam:  General:  Pt is alert, in NAD  HEENT: No icterus, No thrush, oral mucosa moist  Cardiovascular: regular rate and rhythm, S1/S2 No murmur  Respiratory: clear to auscultation bilaterally , no wheezes  Abdomen: Soft, +Bowel sounds, non tender, non distended, no guarding  MSK: No LE edema, cyanosis or clubbing  Data Reviewed: Basic Metabolic Panel:  Recent Labs  Lab 06/07/15 1018 06/08/15 0400 06/09/15 0335  NA 135 135 140  K 3.7 3.8 3.7  CL 105 105 113*  CO2 19* 21* 19*  GLUCOSE 115* 107* 91  BUN 28* 32* 27*  CREATININE 1.43* 2.25* 1.40*  CALCIUM 8.7* 7.8* 7.9*  MG  --   --  1.8   Liver Function Tests: No results for input(s): AST, ALT, ALKPHOS, BILITOT, PROT, ALBUMIN in the last 168 hours. No results for input(s): LIPASE, AMYLASE in the last 168 hours. No results for input(s): AMMONIA in the last 168 hours. CBC:  Recent Labs Lab 06/07/15 1018 06/08/15 0400 06/09/15 0335  WBC 14.8* 14.7* 13.4*  NEUTROABS 12.8*  --   --   HGB 12.6 10.9* 10.5*  HCT 37.5 33.5* 32.3*  MCV  87.4 87.9 88.5  PLT 336 277 355   Cardiac Enzymes:  Recent Labs Lab 06/09/15 0335  TROPONINI <0.03   BNP (last 3 results) No results for input(s): BNP in the last 8760 hours.  ProBNP (last 3 results) No results for input(s): PROBNP in the last 8760 hours.  CBG: No results for input(s): GLUCAP in the last 168 hours.  Recent Results (from the past 240 hour(s))  MRSA PCR Screening     Status: None   Collection Time: 06/07/15  4:13 PM  Result Value Ref Range Status   MRSA by PCR NEGATIVE NEGATIVE Final    Comment:        The GeneXpert MRSA Assay (FDA approved for NASAL specimens only), is one component of a comprehensive MRSA colonization surveillance program. It is not intended to diagnose MRSA infection nor to guide or monitor treatment for MRSA infections.      Studies: Ct Abdomen Pelvis W Contrast  06/07/2015   ADDENDUM REPORT: 06/07/2015 13:56  ADDENDUM: The urinary bladder is decompressed and difficult to exclude urinary bladder wall thickening or inflammation.   Electronically Signed   By: Markus Daft M.D.   On: 06/07/2015 13:56   06/07/2015   CLINICAL DATA:  Bilateral lower back pain.  EXAM: CT ABDOMEN AND PELVIS WITH CONTRAST  TECHNIQUE: Multidetector CT imaging of the abdomen and pelvis was performed using the standard protocol following bolus administration of intravenous contrast.  CONTRAST:  128mL OMNIPAQUE IOHEXOL 300 MG/ML  SOLN  COMPARISON:  None.  FINDINGS: There is bibasilar atelectasis, right side greater the left. There is free intraperitoneal air throughout the upper abdomen. Pockets of free air in the lower abdomen. There is a small amount of perihepatic fluid. Normal appearance of the liver and gallbladder. Portal venous system is patent. Normal appearance of the pancreas, spleen and adrenal glands. Normal appearance of both kidneys.  Normal appearance of the stomach and duodenum. There is oral contrast in the distal small bowel and right colon. There is a  markedly thickened loop of small bowel in the anterior lower abdomen on sequence 2, image 84. The distal small bowel and the right colon are distended. No significant wall thickening involving the terminal ileum. There is a small amount of fluid in the right lower quadrant of the abdomen. Mild pericolonic inflammatory changes involving the distal descending colon and there are diverticula involving the descending colon and sigmoid colon. There is focal edema or fluid between the sigmoid colon and the thickened loop of small bowel on sequence 2, image 81. No discrete abscess collection.  Small amount of free fluid in the pelvis. No gross abnormality to the uterus or adnexa tissue. Small amount of free fluid along  the anterior lower abdomen adjacent to the thickened loop of small bowel. Patient has a periumbilical hernia with mild mesenteric edema.  There is no significant lymphadenopathy in the abdomen or pelvis. However, there is concern for mild lymphadenopathy in the hilar regions bilaterally.  Facet arthropathy in the lower lumbar spine. No acute bone abnormality.  IMPRESSION: Study is positive for pneumoperitoneum. There is fluid and inflammatory changes throughout the lower abdomen and upper pelvis. A short segment of small bowel has wall thickening and compatible with enteritis. This loop of small bowel is adjacent to the sigmoid colon and there are inflammatory changes adjacent to the distal descending colon and sigmoid colon. Findings raise concern for underlying diverticulitis. These inflammatory changes could represent a perforated diverticulitis with secondary small bowel enteritis. However, a primary small bowel enteritis cannot be excluded. In addition, there is dilatation of the distal small bowel and right colon which could be related to an ileus.  Concern for mild chest lymphadenopathy in the hilar regions. This could be further characterized with a post contrast chest CT.  These results were called  by telephone at the time of interpretation on 06/07/2015 at 1:07 pm to Dr. Virgel Manifold , who verbally acknowledged these results.  Electronically Signed: By: Markus Daft M.D. On: 06/07/2015 13:15    Scheduled Meds:  Scheduled Meds: . ertapenem  1 g Intravenous Daily   Continuous Infusions: . sodium chloride 125 mL/hr at 06/09/15 0800  . sodium chloride 0.9 % 1,000 mL infusion 150 mL/hr at 06/08/15 0706    Time spent on care of this patient: 35 min   Velvet Bathe, MD 06/09/2015, 10:07 AM  LOS: 2 days   [facility] Office  [phone] Pager - Text Page per [email or web address] If 7PM-7AM, please contact night-coverage [email or web address]

## 2015-06-09 NOTE — Progress Notes (Signed)
Spoke with cousin, Jolayne Haines.  Patient asked if we would update cousin about upcoming surgery planned for today. Patient said it was ok to given medical information to the cousin and the cousin could update any other family members.  Contact information for the the cousin is written on the board in the room and is also in the computer.  Will continue to monitor.

## 2015-06-09 NOTE — Progress Notes (Signed)
At [place], patient noted to have increase in heart rate to 170s in SVT, then decreased to 120-145s in and out of ST and afib.  SBP has been stable and always >100.  Map currently 88.  Patient is having no complaints of chest pain, but does have surgical pain which has been treated with Dilaudid/Phenergan.  Lamar Blinks NP and Dr. Excell Seltzer were paged.  Orders received and completed.  Patient is still in a ST/ occ A-fib with rate 140s.  Pt has a current pain level of 10/10.  Will give more pain med and ordered lopressor and bolus.

## 2015-06-09 NOTE — Op Note (Signed)
Patient Name:           Deanna Aguilar   Date of Surgery:        06/09/2015  Pre op Diagnosis:      Ruptured diverticulitis with peritonitis  Post op Diagnosis:    Same  Procedure:                 Exploratory laparotomy, sigmoid colectomy, takedown splenic flexure, end colostomy  Surgeon:                     Edsel Petrin. Dalbert Batman, M.D., FACS  Assistant:                      Ralene Ok, M.D.  Operative Indications:   This is a 59 year old African female with morbid obesity and hypertension.  She presented to the emergency department 48 hours ago with several day history of slowly worsening left lower quadrant pain.  She states she had a colonoscopy in [place] last year was told she had diverticulosis.  On  admission she was found have abdominal tenderness with guarding and CT scan showed diverticulitis with scattered bubbles of free air throughout the abdomen.  She was admitted and placed on broad-spectrum antibiotic and bowel rest.  Her creatinine was elevated and she required significant volume expansion.  Her abdominal tenderness has not improved and she has diffuse tenderness with guarding this morning she was advised to undergo laparotomy.  Operative Findings:       The patient had ruptured diverticulitis with purulent peritonitis.  There was no fecal peritonitis.  There are multiple loops of small bowel stuck to the sigmoid colon there is no evidence of any primary disease process of the small bowel.  There was not a lot of fluid in the abdomen but a lot of inflammation.  We were able to resect the diseased section of colon, staple off the proximal rectal stump, and perform an end colostomy with good vascular supply.  This required takedown splenic flexure to get good mobilization to bring into her very thick abdominal wall.  There did not appear to be any primary disease process of the rest of the colon or the small bowel.  Exposure was quite difficult technically because of her very  thick, very heavy abdominal wall.  Procedure in Detail:          Following the induction of general endotracheal anesthesia the patient's abdomen was prepped and draped in a sterile fashion.  She was on therapeutic antibiotics on schedule.  Surgical timeout was performed.  Midline laparotomy incision was made both above and below the umbilicus.  This had to be fairly significant to gain exposure.  exposure.  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     The abdomen was explored with findings as described above.  We slowly mobilize the small bowel up out of the pelvis off the sigmoid colon.  Most this was done with gentle finger fracture technique and a little bit of seizure dissection.  The did not appear to be any injury to the small bowel.  Was able to pack the small bowel away.  I mobilized the sigmoid colon and descending colon away from the lateral abdominal wall.  Ultimately had to completely mobilize the splenic flexure down off of the spleen it where it was very closely adherent.  I divided the distal sigmoid colon distal to the inflammatory processes using a GIA stapling  device.:  Mesentery was divided using the LigaSure device.  Some larger vessels had be suture ligated with 0 silk suture ligatures.  We kept the dissection right on the colon wall to stay out of the retroperitoneum.  We ultimately had enough mobilization and exposure.  We divided the proximal sigmoid colon above the inflammatory area with a GIA stapler device and sent the specimen to the lab.  Once we were satisfied with the mobilization and vascularity of he colon we selected an area of the left abdominal wall for the colostomy.  This was above the umbilicus.  A circular button of skin was excised.  The subcutaneous  fat was debrided.  The rectus fascia was incised in a cruciate fashion.  We retracted the rectus muscles to the side and incised the posterior rectus sheath with the cautery.  We stretched this until  it was about 2-1/2 fingerbreadths wide.  We then made sure the colon was positioned well and not twisted and passed it through the abdominal wall.     We irrigated the abdomen with about 6 or 7 L of saline.  We checked for bleeding and there did not appear to be any.  We placed proline sutures on the rectal stump.  Small bowel was examined and looked fine.  We brought the transverse colon omentum down.  We closed the midline fascia with a running suture of #1 double-stranded PDS and several interrupted #1 Novafil.  We packed the midline wound open with Kerlix saline moistened gauze.  We amputated the  staple line on the colostomy and matured the colostomy with numerous interrupted sutures of 3-0 vicryl.   The colon was pink and healthy and  bled fairly freely.  I was able to pass my finger through the colostomy and into the colon proximally below the level of the fascia.  There was no twisting or obstruction.  Colostomy bag was placed.      The patient tolerated the procedure reasonably well and was taken to PACU in stable condition.  We were unable to place a nasogastric tube.  Estimated blood loss 350 mL.  Counts correct.  Complications none.         Edsel Petrin. Dalbert Batman, M.D., FACS General and Minimally Invasive Surgery Breast and Colorectal Surgery  06/09/2015 1:22 PM

## 2015-06-09 NOTE — Anesthesia Preprocedure Evaluation (Addendum)
Anesthesia Evaluation  Patient identified by MRN, date of birth, ID band Patient awake    Reviewed: Allergy & Precautions, NPO status , Patient's Chart, lab work & pertinent test results  Airway Mallampati: II  TM Distance: >3 FB Neck ROM: Full    Dental no notable dental hx.    Pulmonary neg pulmonary ROS,  breath sounds clear to auscultation  Pulmonary exam normal       Cardiovascular Exercise Tolerance: Good hypertension, Pt. on medications Normal cardiovascular examRhythm:Regular Rate:Normal  ECG: ? Atrial flutter at 119, inferior TWA  Had some episodes of SVT overnight. Currently her heart rate is normal and regular.  Troponin and lactic acid has been normal.   Neuro/Psych negative neurological ROS  negative psych ROS   GI/Hepatic negative GI ROS, Neg liver ROS,   Endo/Other  negative endocrine ROSMorbid obesity  Renal/GU Renal InsufficiencyRenal diseaseCr down from 2.2 to 1.4  negative genitourinary   Musculoskeletal negative musculoskeletal ROS (+)   Abdominal (+) + obese,   Peds negative pediatric ROS (+)  Hematology negative hematology ROS (+)   Anesthesia Other Findings   Reproductive/Obstetrics negative OB ROS                       Anesthesia Physical Anesthesia Plan  ASA: III and emergent  Anesthesia Plan: General   Post-op Pain Management:    Induction: Intravenous  Airway Management Planned: Oral ETT  Additional Equipment:   Intra-op Plan:   Post-operative Plan: Extubation in OR and Possible Post-op intubation/ventilation  Informed Consent: I have reviewed the patients History and Physical, chart, labs and discussed the procedure including the risks, benefits and alternatives for the proposed anesthesia with the patient or authorized representative who has indicated his/her understanding and acceptance.   Dental advisory given  Plan Discussed with:  CRNA  Anesthesia Plan Comments:        Anesthesia Quick Evaluation

## 2015-06-10 ENCOUNTER — Encounter (HOSPITAL_COMMUNITY): Payer: Self-pay | Admitting: General Surgery

## 2015-06-10 ENCOUNTER — Inpatient Hospital Stay (HOSPITAL_COMMUNITY): Payer: Medicaid Other

## 2015-06-10 DIAGNOSIS — M1990 Unspecified osteoarthritis, unspecified site: Secondary | ICD-10-CM | POA: Diagnosis present

## 2015-06-10 DIAGNOSIS — I10 Essential (primary) hypertension: Secondary | ICD-10-CM

## 2015-06-10 DIAGNOSIS — K5720 Diverticulitis of large intestine with perforation and abscess without bleeding: Secondary | ICD-10-CM

## 2015-06-10 DIAGNOSIS — E669 Obesity, unspecified: Secondary | ICD-10-CM

## 2015-06-10 DIAGNOSIS — I4891 Unspecified atrial fibrillation: Secondary | ICD-10-CM

## 2015-06-10 DIAGNOSIS — I4892 Unspecified atrial flutter: Secondary | ICD-10-CM

## 2015-06-10 LAB — CBC
HCT: 32.8 % — ABNORMAL LOW (ref 36.0–46.0)
Hemoglobin: 10.6 g/dL — ABNORMAL LOW (ref 12.0–15.0)
MCH: 28.3 pg (ref 26.0–34.0)
MCHC: 32.3 g/dL (ref 30.0–36.0)
MCV: 87.5 fL (ref 78.0–100.0)
PLATELETS: 389 10*3/uL (ref 150–400)
RBC: 3.75 MIL/uL — AB (ref 3.87–5.11)
RDW: 14.8 % (ref 11.5–15.5)
WBC: 14 10*3/uL — AB (ref 4.0–10.5)

## 2015-06-10 LAB — BASIC METABOLIC PANEL
ANION GAP: 8 (ref 5–15)
BUN: 27 mg/dL — ABNORMAL HIGH (ref 6–20)
CALCIUM: 7.7 mg/dL — AB (ref 8.9–10.3)
CHLORIDE: 115 mmol/L — AB (ref 101–111)
CO2: 18 mmol/L — ABNORMAL LOW (ref 22–32)
CREATININE: 1.35 mg/dL — AB (ref 0.44–1.00)
GFR calc non Af Amer: 43 mL/min — ABNORMAL LOW (ref >60)
GFR, EST AFRICAN AMERICAN: 49 mL/min — AB (ref >60)
Glucose, Bld: 145 mg/dL — ABNORMAL HIGH (ref 65–99)
Potassium: 4.4 mmol/L (ref 3.5–5.1)
SODIUM: 141 mmol/L (ref 135–145)

## 2015-06-10 MED ORDER — SODIUM CHLORIDE 0.9 % IV BOLUS (SEPSIS)
1000.0000 mL | Freq: Once | INTRAVENOUS | Status: AC
  Administered 2015-06-10: 1000 mL via INTRAVENOUS

## 2015-06-10 MED ORDER — METOPROLOL TARTRATE 1 MG/ML IV SOLN
5.0000 mg | Freq: Once | INTRAVENOUS | Status: AC
  Administered 2015-06-10: 5 mg via INTRAVENOUS
  Filled 2015-06-10: qty 5

## 2015-06-10 MED ORDER — AMIODARONE HCL IN DEXTROSE 360-4.14 MG/200ML-% IV SOLN
30.0000 mg/h | INTRAVENOUS | Status: DC
  Administered 2015-06-10 – 2015-06-11 (×4): 30 mg/h via INTRAVENOUS
  Filled 2015-06-10 (×4): qty 200

## 2015-06-10 MED ORDER — DILTIAZEM LOAD VIA INFUSION
10.0000 mg | Freq: Once | INTRAVENOUS | Status: AC
  Administered 2015-06-10: 10 mg via INTRAVENOUS
  Filled 2015-06-10: qty 10

## 2015-06-10 MED ORDER — CETYLPYRIDINIUM CHLORIDE 0.05 % MT LIQD
7.0000 mL | Freq: Two times a day (BID) | OROMUCOSAL | Status: DC
  Administered 2015-06-10 – 2015-06-18 (×16): 7 mL via OROMUCOSAL

## 2015-06-10 MED ORDER — AMIODARONE HCL IN DEXTROSE 360-4.14 MG/200ML-% IV SOLN
60.0000 mg/h | INTRAVENOUS | Status: AC
  Administered 2015-06-10: 60 mg/h via INTRAVENOUS
  Filled 2015-06-10: qty 200

## 2015-06-10 MED ORDER — DILTIAZEM HCL 100 MG IV SOLR
5.0000 mg/h | INTRAVENOUS | Status: DC
  Administered 2015-06-10 – 2015-06-11 (×3): 5 mg/h via INTRAVENOUS
  Filled 2015-06-10 (×3): qty 100

## 2015-06-10 MED ORDER — ENOXAPARIN SODIUM 60 MG/0.6ML ~~LOC~~ SOLN
60.0000 mg | SUBCUTANEOUS | Status: DC
  Administered 2015-06-10 – 2015-06-11 (×2): 60 mg via SUBCUTANEOUS
  Filled 2015-06-10 (×2): qty 0.6

## 2015-06-10 MED ORDER — AMIODARONE LOAD VIA INFUSION
150.0000 mg | Freq: Once | INTRAVENOUS | Status: AC
  Administered 2015-06-10: 150 mg via INTRAVENOUS
  Filled 2015-06-10 (×3): qty 83.34

## 2015-06-10 MED ORDER — METOPROLOL TARTRATE 25 MG PO TABS
25.0000 mg | ORAL_TABLET | Freq: Two times a day (BID) | ORAL | Status: DC
  Administered 2015-06-10 – 2015-06-18 (×17): 25 mg via ORAL
  Filled 2015-06-10 (×17): qty 1

## 2015-06-10 MED ORDER — METOPROLOL TARTRATE 1 MG/ML IV SOLN: 5.0000 mg | Freq: Once | INTRAVENOUS | Status: DC

## 2015-06-10 NOTE — Progress Notes (Signed)
[facility] Progress Note   Deanna Aguilar [id number] DOB: Oct 08, 1956 DOA: 06/07/2015 PCP: [name], MD  Brief narrative: Deanna Aguilar is a 59 y.o. female with hypertension who presents with abdominal pain for 2 days. She points to the suprapubic area and states that it radiates from there to the right and left upper abdomen into her back. She feels pain when she tries to urinate as well. Pain quite severe. She has had nausea but no vomiting. She has had no appetite. She's felt hot and cold but has not checked her temperature. He has been taking Aleve and BC powders help control pain.    Subjective: Pt has no new complaints. Denies any shortness of breath or chest pain  Assessment/Plan: Principal Problem:  Diverticulitis of colon with perforation/leukocytosis/peritonitis -General surgery on board and assisting - continue IV antibiotic (invanz) - supportive therapy  Tachycardia - On telemetry Sinus tachycardia as such administered fluid bolus of 1L over 2 hours. And obtained EKG. Upon further evaluation with EKG patient diagnosed with atrial flutter and subsequently cardiology consulted. Patient was given Cardizem overnight - We'll defer further recommendations to cardiology at this time  Active Problems:  Sepsis - Hypotension/ leukocytosis - Continue current normal saline infusion rate, blood pressure stable currently. - continue Invanz  Renal failure - improving after IV fluid infusion and continue IV antibiotic administration. -May also be related to NSAIDs and ACE inhibitor along with a prerenal state -Hold ACE inhibitor  Code Status: Full code Family Communication:  Disposition Plan: per sugery  DVT prophylaxis: Heparin Consultants:surgery  Procedures:  Antibiotics: Anti-infectives    Start     Dose/Rate Route Frequency Ordered Stop   06/08/15 0615  ertapenem (INVANZ) 1 g in sodium chloride 0.9 % 50 mL IVPB     1 g 100 mL/hr over 30 Minutes  Intravenous Daily 06/08/15 0609     06/07/15 2359  ciprofloxacin (CIPRO) IVPB 400 mg  Status:  Discontinued     400 mg 200 mL/hr over 60 Minutes Intravenous Every 12 hours 06/07/15 1524 06/08/15 0609   06/07/15 1500  metroNIDAZOLE (FLAGYL) IVPB 500 mg  Status:  Discontinued     500 mg 100 mL/hr over 60 Minutes Intravenous Every 8 hours 06/07/15 1459 06/08/15 0609   06/07/15 1300  ciprofloxacin (CIPRO) IVPB 400 mg     400 mg 200 mL/hr over 60 Minutes Intravenous  Once 06/07/15 1258 06/07/15 1422   06/07/15 1300  metroNIDAZOLE (FLAGYL) IVPB 500 mg     500 mg 100 mL/hr over 60 Minutes Intravenous  Once 06/07/15 1258 06/07/15 1422      Objective: Filed Weights   06/07/15 1615  Weight: 121.1 kg (266 lb 15.6 oz)    Intake/Output Summary (Last 24 hours) at 06/10/15 1217 Last data filed at 06/10/15 1200  Gross per 24 hour  Intake 7127.08 ml  Output   1010 ml  Net 6117.08 ml     Vitals Filed Vitals:   06/10/15 0800 06/10/15 0900 06/10/15 1000 06/10/15 1100  BP: 124/80 127/96 117/71 120/83  Pulse: 131 137 139 70  Temp: 99.1 F (37.3 C) 99.3 F (37.4 C) 99.1 F (37.3 C) 99 F (37.2 C)  TempSrc:    Core (Comment)  Resp: 18 24 20 10   Height:      Weight:      SpO2: 100% 100% 100% 100%    Exam:  General:  Pt is alert, in NAD  HEENT: No icterus, No thrush, oral mucosa moist  Cardiovascular: regular rate and rhythm, S1/S2 No murmur  Respiratory: clear to auscultation bilaterally , no wheezes  Abdomen: Soft, +Bowel sounds, non tender, non distended, no guarding  MSK: No LE edema, cyanosis or clubbing  Data Reviewed: Basic Metabolic Panel:  Recent Labs Lab 06/08/15 0400 06/09/15 0335 06/09/15 1403 06/09/15 2235 06/10/15 0349  NA 135 140 141 141 141  K 3.8 3.7 4.1 4.3 4.4  CL 105 113* 113* 114* 115*  CO2 21* 19* 20* 19* 18*  GLUCOSE 107* 91 110* 137* 145*  BUN 32* 27* 23* 25* 27*  CREATININE 2.25* 1.40* 1.25* 1.25* 1.35*  CALCIUM 7.8* 7.9* 7.8* 7.6* 7.7*   MG  --  1.8  --  1.9  --    Liver Function Tests: No results for input(s): AST, ALT, ALKPHOS, BILITOT, PROT, ALBUMIN in the last 168 hours. No results for input(s): LIPASE, AMYLASE in the last 168 hours. No results for input(s): AMMONIA in the last 168 hours. CBC:  Recent Labs Lab 06/07/15 1018 06/08/15 0400 06/09/15 0335 06/09/15 1403 06/10/15 0349  WBC 14.8* 14.7* 13.4* 14.9* 14.0*  NEUTROABS 12.8*  --   --   --   --   HGB 12.6 10.9* 10.5* 11.5* 10.6*  HCT 37.5 33.5* 32.3* 34.7* 32.8*  MCV 87.4 87.9 88.5 88.1 87.5  PLT 336 277 355 374 389   Cardiac Enzymes:  Recent Labs Lab 06/09/15 0335  TROPONINI <0.03   BNP (last 3 results) No results for input(s): BNP in the last 8760 hours.  ProBNP (last 3 results) No results for input(s): PROBNP in the last 8760 hours.  CBG: No results for input(s): GLUCAP in the last 168 hours.  Recent Results (from the past 240 hour(s))  MRSA PCR Screening     Status: None   Collection Time: 06/07/15  4:13 PM  Result Value Ref Range Status   MRSA by PCR NEGATIVE NEGATIVE Final    Comment:        The GeneXpert MRSA Assay (FDA approved for NASAL specimens only), is one component of a comprehensive MRSA colonization surveillance program. It is not intended to diagnose MRSA infection nor to guide or monitor treatment for MRSA infections.      Studies: No results found.  Scheduled Meds:  Scheduled Meds: . antiseptic oral rinse  7 mL Mouth Rinse BID  . enoxaparin (LOVENOX) injection  60 mg Subcutaneous Q24H  . ertapenem  1 g Intravenous Daily  . metoprolol  5 mg Intravenous Once  . metoprolol tartrate  25 mg Oral BID  . sodium chloride  1,000 mL Intravenous Once   Continuous Infusions: . sodium chloride 125 mL/hr at 06/10/15 1200  . sodium chloride 0.9 % 1,000 mL infusion Stopped (06/10/15 0600)    Time spent on care of this patient: 35 min   Velvet Bathe, MD 06/10/2015, 12:17 PM  LOS: 3 days   [facility] Office  [phone] Pager - Text Page per [email or web address] If 7PM-7AM, please contact night-coverage [email or web address]

## 2015-06-10 NOTE — Progress Notes (Addendum)
1 Day Post-Op  Subjective: Alert.  Says her pain is much less and she feels much better. Received some IV fluid boluses last night.  Urine output adequate. Had some nausea last night but states she is hungry this morning Operative procedure and findings discussed with patient.  Went back into atrial fib with rapid ventricular response.  Medical service managing.  2 Cardizem boluses did not do anything.  We'll check with them to see if she needs a drip and/or a  cardiology consult. No chest pain or dyspnea. Using incentive spirometer well.  Potassium 4.4.  Creatinine 1.35.  Glucose 145.  WBC 14,000.  Hemoglobin 10.6.  Objective: Vital signs in last 24 hours: Temp:  [97.9 F (36.6 C)-100 F (37.8 C)] 99.3 F (37.4 C) (07/07 0300) Pulse Rate:  [80-170] 141 (07/07 0300) Resp:  [9-23] 10 (07/07 0300) BP: (92-161)/(52-89) 98/80 mmHg (07/07 0300) SpO2:  [91 %-100 %] 100 % (07/07 0300) Last BM Date: 06/06/15  Intake/Output from previous day: 07/06 0701 - 07/07 0700 In: 6502.1 [I.V.:5902.1; IV Piggyback:600] Out: 1080 [Urine:830; Blood:250] Intake/Output this shift:    General appearance: Alert.  Spirits better.  Does not appear to be in any distress. Resp: Clear to auscultation bilaterally.  Vital capacity 1250 mL. GI: Obese.  Soft.  Minimal bowel sounds, but not distended.  Midline wound clean and dry, packed open.  Colostomy pink and viable.  No output.  Lab Results:  Results for orders placed or performed during the hospital encounter of 06/07/15 (from the past 24 hour(s))  CBC     Status: Abnormal   Collection Time: 06/09/15  2:03 PM  Result Value Ref Range   WBC 14.9 (H) 4.0 - 10.5 K/uL   RBC 3.94 3.87 - 5.11 MIL/uL   Hemoglobin 11.5 (L) 12.0 - 15.0 g/dL   HCT 34.7 (L) 36.0 - 46.0 %   MCV 88.1 78.0 - 100.0 fL   MCH 29.2 26.0 - 34.0 pg   MCHC 33.1 30.0 - 36.0 g/dL   RDW 14.6 11.5 - 15.5 %   Platelets 374 150 - 400 K/uL  Basic metabolic panel     Status: Abnormal   Collection Time: 06/09/15  2:03 PM  Result Value Ref Range   Sodium 141 135 - 145 mmol/L   Potassium 4.1 3.5 - 5.1 mmol/L   Chloride 113 (H) 101 - 111 mmol/L   CO2 20 (L) 22 - 32 mmol/L   Glucose, Bld 110 (H) 65 - 99 mg/dL   BUN 23 (H) 6 - 20 mg/dL   Creatinine, Ser 1.25 (H) 0.44 - 1.00 mg/dL   Calcium 7.8 (L) 8.9 - 10.3 mg/dL   GFR calc non Af Amer 47 (L) >60 mL/min   GFR calc Af Amer 54 (L) >60 mL/min   Anion gap 8 5 - 15  Basic metabolic panel     Status: Abnormal   Collection Time: 06/09/15 10:35 PM  Result Value Ref Range   Sodium 141 135 - 145 mmol/L   Potassium 4.3 3.5 - 5.1 mmol/L   Chloride 114 (H) 101 - 111 mmol/L   CO2 19 (L) 22 - 32 mmol/L   Glucose, Bld 137 (H) 65 - 99 mg/dL   BUN 25 (H) 6 - 20 mg/dL   Creatinine, Ser 1.25 (H) 0.44 - 1.00 mg/dL   Calcium 7.6 (L) 8.9 - 10.3 mg/dL   GFR calc non Af Amer 47 (L) >60 mL/min   GFR calc Af Amer 54 (L) >60 mL/min  Anion gap 8 5 - 15  Magnesium     Status: None   Collection Time: 06/09/15 10:35 PM  Result Value Ref Range   Magnesium 1.9 1.7 - 2.4 mg/dL  CBC     Status: Abnormal   Collection Time: 06/10/15  3:49 AM  Result Value Ref Range   WBC 14.0 (H) 4.0 - 10.5 K/uL   RBC 3.75 (L) 3.87 - 5.11 MIL/uL   Hemoglobin 10.6 (L) 12.0 - 15.0 g/dL   HCT 32.8 (L) 36.0 - 46.0 %   MCV 87.5 78.0 - 100.0 fL   MCH 28.3 26.0 - 34.0 pg   MCHC 32.3 30.0 - 36.0 g/dL   RDW 14.8 11.5 - 15.5 %   Platelets 389 150 - 400 K/uL  Basic metabolic panel     Status: Abnormal   Collection Time: 06/10/15  3:49 AM  Result Value Ref Range   Sodium 141 135 - 145 mmol/L   Potassium 4.4 3.5 - 5.1 mmol/L   Chloride 115 (H) 101 - 111 mmol/L   CO2 18 (L) 22 - 32 mmol/L   Glucose, Bld 145 (H) 65 - 99 mg/dL   BUN 27 (H) 6 - 20 mg/dL   Creatinine, Ser 1.35 (H) 0.44 - 1.00 mg/dL   Calcium 7.7 (L) 8.9 - 10.3 mg/dL   GFR calc non Af Amer 43 (L) >60 mL/min   GFR calc Af Amer 49 (L) >60 mL/min   Anion gap 8 5 - 15     Studies/Results: No results  found.  Marland Kitchen antiseptic oral rinse  7 mL Mouth Rinse BID  . ertapenem  1 g Intravenous Daily  . metoprolol  5 mg Intravenous Once  . metoprolol  5 mg Intravenous Once  . metoprolol tartrate  25 mg Oral BID     Assessment/Plan: s/p Procedure(s): SIGMOID COLECTOMY, COLOSTOMY, TAKE DOWN SPLENIC FLECTURE  Ruptured diverticulitis with peritonitis. POD #1.  Laparotomy, sigmoid colectomy with takedown splenic flexure, end colostomy. Doing reasonably well Mobilize out of bed Continue IV antibiotic's Nothing by mouth except ice chips WOC consult  Atrial fibrillation with RVR. Will discuss with Dr. Wendee Beavers.  Question whether she is Cardizem drip and/or cardiology consult.  Sepsis with transient hypotension, leukocytosis, and volume depletion. She's becoming more euvolemic now. Continue close monitoring monitoring of renal function.  Renal failure.  Nonoliguric.  Stabilizing.  DVT prophylaxis.  To start Lovenox this morning.  @PROBHOSP @  LOS: 3 days    Matheson Vandehei M 06/10/2015  . .prob

## 2015-06-10 NOTE — Consult Note (Signed)
WOC ostomy consult note Stoma type/location: LLQ [place] Stomal assessment/size: Visualized through pouch, approximately 1 and 5/8 inches round, deep red, moist Peristomal assessment: not seen today Treatment options for stomal/peristomal skin: None indicated Output Serosanguinous Ostomy pouching: Paient is wearing 1 piece karaya post op pouch Education provided: None today Enrolled patient in [place] program: No I will obtain supplies and see in am for 1st post op pouch change.  Thanks, Maudie Flakes, MSN, RN, [place], [place], [place] [phone])

## 2015-06-10 NOTE — Progress Notes (Signed)
Cardizem 5mg  bolus given per order.  [place]

## 2015-06-10 NOTE — Progress Notes (Signed)
ANTICOAGULATION CONSULT NOTE   Pharmacy Consult for Lovenox Indication: VTE prophylaxis  No Known Allergies  Patient Measurements: Height: 5\' 9"  (175.3 cm) Weight: 266 lb 15.6 oz (121.1 kg) IBW/kg (Calculated) : 66.2  Vital Signs: Temp: 99.3 F (37.4 C) (07/07 0300) Temp Source: Core (Comment) (07/07 0000) BP: 98/80 mmHg (07/07 0300) Pulse Rate: 141 (07/07 0300)  Labs:  Recent Labs  06/09/15 0335 06/09/15 1403 06/09/15 2235 06/10/15 0349  HGB 10.5* 11.5*  --  10.6*  HCT 32.3* 34.7*  --  32.8*  PLT 355 374  --  389  CREATININE 1.40* 1.25* 1.25* 1.35*  TROPONINI <0.03  --   --   --     Estimated Creatinine Clearance: 64 mL/min (by C-G formula based on Cr of 1.35).    Assessment: 6 yoF admitted 7/4 with abdominal pain x 2 days.  CT shows ruptured diverticulitis with peritonitis.  She underwent exploratory laparotomy, sigmoid colectomy w/ colostomy, and takedown splenic flexure on 7/6.  Pharmacy is now consulted to dose Lovenox for VTE prophylaxis. Her last dose of Heparin 5000 units SQ for VTE prophylaxis was given 7/5 at 2200, then held prior to OR. Of note, she went into Afib with RVR post-op overnight.  CCS to discuss with [facility].  Today, 06/10/2015: CBC: Hgb drifted down to 10.6, Plt remain WNL. No bleeding or complications reported. Renal: SCr 1.35 (increased) with CrCl ~ 64 ml/min BMI 39  Goal of Therapy:  VTE prophylaxis Monitor platelets by anticoagulation protocol: Yes   Plan:   Lovenox 60mg  (0.5 mg/kg) SQ q24 hrs.  Pharmacy to s/o note writing but will f/u CBC and renal function peripherally.  Gretta Arab PharmD, BCPS Pager [phone] 06/10/2015 8:36 AM

## 2015-06-10 NOTE — Progress Notes (Signed)
  Echocardiogram 2D Echocardiogram has been performed.  Deanna Aguilar M 06/10/2015, 2:38 PM

## 2015-06-10 NOTE — Consult Note (Signed)
Reason for Consult:   A flutter  Requesting Physician: [facility] Primary Cardiologist Dr Tamala Julian (new)  HPI: This is a 59 y.o. 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 She tells me she saw physician in [place] in Aug 2015 and he ordered several test including a  "heart test" which was OK. It does not sound like she has ever had a cath, MI, CHF, or an arrhythmia. She was admitted 06/07/15 with a ruptured diverticula and peritonitis. She underwent surgery 06/09/15. Post op she developed atrial flutter with variable block and rapid VR. We are asked now to see in consult.   PMHx:  Past Medical History  Diagnosis Date  . Hypertension     Past Surgical History  Procedure Laterality Date  . Tubal ligation      SOCHx:  reports that she has never smoked. She does not have any smokeless tobacco history on file. She reports that she does not drink alcohol or use illicit drugs.  FAMHx: History reviewed. No pertinent family history.  ALLERGIES: No Known Allergies  ROS: Pertinent items are noted in HPI. see H&P for complete ROS. She denies any chest pain or arhythmia history. She has back and knee arthritis and takes BC powders and NSAID.   HOME MEDICATIONS: Prior to Admission medications   Medication Sig Start Date End Date Taking? Authorizing Provider  Aspirin-Acetaminophen-Caffeine 520-260-32.5 MG PACK Take 1-2 packets by mouth every 6 (six) hours as needed (For back pain.).   Yes Historical Provider, MD  Aspirin-Caffeine 845-65 MG PACK Take 1 packet by mouth every 6 (six) hours as needed (For back pain.).   Yes Historical Provider, MD  losartan-hydrochlorothiazide (HYZAAR) 100-12.5 MG per tablet Take 1 tablet by mouth daily.   Yes Historical Provider, MD  naproxen sodium (ALEVE) 220 MG tablet Take 440-660 mg by mouth every 8 (eight) hours as  needed (For back pain.).   Yes Historical Provider, MD    HOSPITAL MEDICATIONS: I have reviewed the patient's current medications.  VITALS: Blood pressure 110/83, pulse 140, temperature 98.6 F (37 C), temperature source Core (Comment), resp. rate 11, height 5\' 9"  (1.753 m), weight 266 lb 15.6 oz (121.1 kg), SpO2 100 %.  PHYSICAL EXAM: General appearance: alert, cooperative, no distress and morbidly obese Neck: no carotid bruit and no JVD Lungs: decreased at bases Heart: irregularly irregular rhythm Abdomen: colostomy, obese Extremities: trace edema Pulses: 2+ and symmetric Skin: Skin color, texture, turgor normal. No rashes or lesions Neurologic: Grossly normal  LABS: Results for orders placed or performed during the hospital encounter of 06/07/15 (from the past 24 hour(s))  CBC     Status: Abnormal   Collection Time: 06/09/15  2:03 PM  Result Value Ref Range   WBC 14.9 (H) 4.0 - 10.5 K/uL   RBC 3.94 3.87 - 5.11 MIL/uL   Hemoglobin 11.5 (L) 12.0 - 15.0 g/dL   HCT 34.7 (L) 36.0 - 46.0 %   MCV 88.1 78.0 - 100.0 fL   MCH 29.2 26.0 - 34.0 pg   MCHC 33.1 30.0 - 36.0 g/dL   RDW 14.6 11.5 - 15.5 %   Platelets 374 150 - 400 K/uL  Basic metabolic panel     Status: Abnormal   Collection Time: 06/09/15  2:03 PM  Result Value Ref Range   Sodium 141 135 - 145 mmol/L  Potassium 4.1 3.5 - 5.1 mmol/L   Chloride 113 (H) 101 - 111 mmol/L   CO2 20 (L) 22 - 32 mmol/L   Glucose, Bld 110 (H) 65 - 99 mg/dL   BUN 23 (H) 6 - 20 mg/dL   Creatinine, Ser 1.25 (H) 0.44 - 1.00 mg/dL   Calcium 7.8 (L) 8.9 - 10.3 mg/dL   GFR calc non Af Amer 47 (L) >60 mL/min   GFR calc Af Amer 54 (L) >60 mL/min   Anion gap 8 5 - 15  Basic metabolic panel     Status: Abnormal   Collection Time: 06/09/15 10:35 PM  Result Value Ref Range   Sodium 141 135 - 145 mmol/L   Potassium 4.3 3.5 - 5.1 mmol/L   Chloride 114 (H) 101 - 111 mmol/L   CO2 19 (L) 22 - 32 mmol/L   Glucose, Bld 137 (H) 65 - 99 mg/dL   BUN 25  (H) 6 - 20 mg/dL   Creatinine, Ser 1.25 (H) 0.44 - 1.00 mg/dL   Calcium 7.6 (L) 8.9 - 10.3 mg/dL   GFR calc non Af Amer 47 (L) >60 mL/min   GFR calc Af Amer 54 (L) >60 mL/min   Anion gap 8 5 - 15  Magnesium     Status: None   Collection Time: 06/09/15 10:35 PM  Result Value Ref Range   Magnesium 1.9 1.7 - 2.4 mg/dL  CBC     Status: Abnormal   Collection Time: 06/10/15  3:49 AM  Result Value Ref Range   WBC 14.0 (H) 4.0 - 10.5 K/uL   RBC 3.75 (L) 3.87 - 5.11 MIL/uL   Hemoglobin 10.6 (L) 12.0 - 15.0 g/dL   HCT 32.8 (L) 36.0 - 46.0 %   MCV 87.5 78.0 - 100.0 fL   MCH 28.3 26.0 - 34.0 pg   MCHC 32.3 30.0 - 36.0 g/dL   RDW 14.8 11.5 - 15.5 %   Platelets 389 150 - 400 K/uL  Basic metabolic panel     Status: Abnormal   Collection Time: 06/10/15  3:49 AM  Result Value Ref Range   Sodium 141 135 - 145 mmol/L   Potassium 4.4 3.5 - 5.1 mmol/L   Chloride 115 (H) 101 - 111 mmol/L   CO2 18 (L) 22 - 32 mmol/L   Glucose, Bld 145 (H) 65 - 99 mg/dL   BUN 27 (H) 6 - 20 mg/dL   Creatinine, Ser 1.35 (H) 0.44 - 1.00 mg/dL   Calcium 7.7 (L) 8.9 - 10.3 mg/dL   GFR calc non Af Amer 43 (L) >60 mL/min   GFR calc Af Amer 49 (L) >60 mL/min   Anion gap 8 5 - 15    EKG: A flutter with rapid VR  IMAGING: No results found.  IMPRESSION: Principal Problem:   Diverticulitis of colon with perforation Active Problems:   Peritonitis   HTN (hypertension)   Acute renal insufficiency   Leukocytosis   Obesity (BMI 39)   Arthritis- on disability   RECOMMENDATION: Will start IV Diltiazem for rate control and IV Amiodarone. She has is CHADs VASc 1 for HTN. Recommend therapeutic anticoagulation when safe post op. Check echo when rate controlled. Document TSH.   Time Spent Directly with Patient: 45 minutes  Erlene Quan [phone] beeper 06/10/2015, 12:25 PM

## 2015-06-11 DIAGNOSIS — I483 Typical atrial flutter: Secondary | ICD-10-CM

## 2015-06-11 DIAGNOSIS — I4891 Unspecified atrial fibrillation: Secondary | ICD-10-CM | POA: Diagnosis not present

## 2015-06-11 LAB — TSH: TSH: 0.579 u[IU]/mL (ref 0.350–4.500)

## 2015-06-11 LAB — BASIC METABOLIC PANEL
ANION GAP: 7 (ref 5–15)
BUN: 33 mg/dL — AB (ref 6–20)
CO2: 19 mmol/L — ABNORMAL LOW (ref 22–32)
Calcium: 8 mg/dL — ABNORMAL LOW (ref 8.9–10.3)
Chloride: 117 mmol/L — ABNORMAL HIGH (ref 101–111)
Creatinine, Ser: 1.32 mg/dL — ABNORMAL HIGH (ref 0.44–1.00)
GFR, EST AFRICAN AMERICAN: 51 mL/min — AB (ref >60)
GFR, EST NON AFRICAN AMERICAN: 44 mL/min — AB (ref >60)
Glucose, Bld: 117 mg/dL — ABNORMAL HIGH (ref 65–99)
POTASSIUM: 4.5 mmol/L (ref 3.5–5.1)
SODIUM: 143 mmol/L (ref 135–145)

## 2015-06-11 LAB — MAGNESIUM: Magnesium: 2.6 mg/dL — ABNORMAL HIGH (ref 1.7–2.4)

## 2015-06-11 LAB — CBC
HEMATOCRIT: 28.7 % — AB (ref 36.0–46.0)
Hemoglobin: 9.5 g/dL — ABNORMAL LOW (ref 12.0–15.0)
MCH: 29 pg (ref 26.0–34.0)
MCHC: 33.1 g/dL (ref 30.0–36.0)
MCV: 87.5 fL (ref 78.0–100.0)
Platelets: 402 10*3/uL — ABNORMAL HIGH (ref 150–400)
RBC: 3.28 MIL/uL — ABNORMAL LOW (ref 3.87–5.11)
RDW: 15.2 % (ref 11.5–15.5)
WBC: 13.9 10*3/uL — ABNORMAL HIGH (ref 4.0–10.5)

## 2015-06-11 MED ORDER — ENOXAPARIN SODIUM 60 MG/0.6ML ~~LOC~~ SOLN
60.0000 mg | Freq: Once | SUBCUTANEOUS | Status: AC
  Administered 2015-06-11: 60 mg via SUBCUTANEOUS
  Filled 2015-06-11 (×2): qty 0.6

## 2015-06-11 MED ORDER — ENOXAPARIN SODIUM 150 MG/ML ~~LOC~~ SOLN
1.0000 mg/kg | Freq: Two times a day (BID) | SUBCUTANEOUS | Status: DC
  Administered 2015-06-11 – 2015-06-14 (×7): 130 mg via SUBCUTANEOUS
  Filled 2015-06-11 (×9): qty 1

## 2015-06-11 NOTE — Progress Notes (Signed)
[facility] Progress Note   Deanna Aguilar [id number] DOB: 1956-11-26 DOA: 06/07/2015 PCP: [name], MD  Brief narrative: Deanna Aguilar is a 59 y.o. female with hypertension who presents with abdominal pain for 2 days. She points to the suprapubic area and states that it radiates from there to the right and left upper abdomen into her back. She feels pain when she tries to urinate as well. Pain quite severe. She has had nausea but no vomiting. She has had no appetite. She's felt hot and cold but has not checked her temperature. He has been taking Aleve and BC powders help control pain.    Subjective: Pt has no new complaints. States she feels better  Assessment/Plan: Principal Problem:  Diverticulitis of colon with perforation/leukocytosis/peritonitis - General surgery on board and assisting - continue IV antibiotic (invanz) - supportive therapy  Atrial fibrillation with rvr - Cardiology on board and managing.  Active Problems:  Sepsis - Hypotension/ leukocytosis - Resolving on current antibiotics therapy. WBC trending down  Renal failure - improving after IV fluid infusion and continue IV antibiotic administration. -May also be related to NSAIDs and ACE inhibitor along with a prerenal state -Hold ACE inhibitor  Code Status: Full code Family Communication:  Disposition Plan: per sugery  DVT prophylaxis: Heparin Consultants:surgery  Procedures:  Antibiotics: Anti-infectives    Start     Dose/Rate Route Frequency Ordered Stop   06/08/15 0615  ertapenem (INVANZ) 1 g in sodium chloride 0.9 % 50 mL IVPB     1 g 100 mL/hr over 30 Minutes Intravenous Daily 06/08/15 0609     06/07/15 2359  ciprofloxacin (CIPRO) IVPB 400 mg  Status:  Discontinued     400 mg 200 mL/hr over 60 Minutes Intravenous Every 12 hours 06/07/15 1524 06/08/15 0609   06/07/15 1500  metroNIDAZOLE (FLAGYL) IVPB 500 mg  Status:  Discontinued     500 mg 100 mL/hr over 60 Minutes  Intravenous Every 8 hours 06/07/15 1459 06/08/15 0609   06/07/15 1300  ciprofloxacin (CIPRO) IVPB 400 mg     400 mg 200 mL/hr over 60 Minutes Intravenous  Once 06/07/15 1258 06/07/15 1422   06/07/15 1300  metroNIDAZOLE (FLAGYL) IVPB 500 mg     500 mg 100 mL/hr over 60 Minutes Intravenous  Once 06/07/15 1258 06/07/15 1422      Objective: Filed Weights   06/07/15 1615 06/11/15 0600  Weight: 121.1 kg (266 lb 15.6 oz) 131.4 kg (289 lb 11 oz)    Intake/Output Summary (Last 24 hours) at 06/11/15 1343 Last data filed at 06/11/15 1237  Gross per 24 hour  Intake 3844.04 ml  Output    850 ml  Net 2994.04 ml     Vitals Filed Vitals:   06/11/15 0700 06/11/15 0800 06/11/15 1000 06/11/15 1056  BP: 118/72 103/73 115/77 115/77  Pulse: 80 78 60 78  Temp: 99 F (37.2 C) 98.8 F (37.1 C)    TempSrc:  Core (Comment)    Resp: 10 11 14    Height:      Weight:      SpO2: 92% 93% 91%     Exam:  General:  Pt is alert, in NAD  HEENT: No icterus, No thrush, oral mucosa moist  Cardiovascular: regular rate and rhythm, S1/S2 No murmur  Respiratory: clear to auscultation bilaterally , no wheezes  Abdomen: Soft, non distended, no guarding  MSK: No LE edema, cyanosis or clubbing  Data Reviewed: Basic Metabolic Panel:  Recent Labs Lab 06/09/15  [id number] 06/09/15 1403 06/09/15 2235 06/10/15 0349 06/11/15 0340  NA 140 141 141 141 143  K 3.7 4.1 4.3 4.4 4.5  CL 113* 113* 114* 115* 117*  CO2 19* 20* 19* 18* 19*  GLUCOSE 91 110* 137* 145* 117*  BUN 27* 23* 25* 27* 33*  CREATININE 1.40* 1.25* 1.25* 1.35* 1.32*  CALCIUM 7.9* 7.8* 7.6* 7.7* 8.0*  MG 1.8  --  1.9  --  2.6*   Liver Function Tests: No results for input(s): AST, ALT, ALKPHOS, BILITOT, PROT, ALBUMIN in the last 168 hours. No results for input(s): LIPASE, AMYLASE in the last 168 hours. No results for input(s): AMMONIA in the last 168 hours. CBC:  Recent Labs Lab 06/07/15 1018 06/08/15 0400 06/09/15 0335 06/09/15 1403  06/10/15 0349 06/11/15 0043  WBC 14.8* 14.7* 13.4* 14.9* 14.0* 13.9*  NEUTROABS 12.8*  --   --   --   --   --   HGB 12.6 10.9* 10.5* 11.5* 10.6* 9.5*  HCT 37.5 33.5* 32.3* 34.7* 32.8* 28.7*  MCV 87.4 87.9 88.5 88.1 87.5 87.5  PLT 336 277 355 374 389 402*   Cardiac Enzymes:  Recent Labs Lab 06/09/15 0335  TROPONINI <0.03   BNP (last 3 results) No results for input(s): BNP in the last 8760 hours.  ProBNP (last 3 results) No results for input(s): PROBNP in the last 8760 hours.  CBG: No results for input(s): GLUCAP in the last 168 hours.  Recent Results (from the past 240 hour(s))  MRSA PCR Screening     Status: None   Collection Time: 06/07/15  4:13 PM  Result Value Ref Range Status   MRSA by PCR NEGATIVE NEGATIVE Final    Comment:        The GeneXpert MRSA Assay (FDA approved for NASAL specimens only), is one component of a comprehensive MRSA colonization surveillance program. It is not intended to diagnose MRSA infection nor to guide or monitor treatment for MRSA infections.      Studies: No results found.  Scheduled Meds:  Scheduled Meds: . antiseptic oral rinse  7 mL Mouth Rinse BID  . enoxaparin (LOVENOX) injection  1 mg/kg Subcutaneous Q12H  . enoxaparin (LOVENOX) injection  60 mg Subcutaneous Once  . ertapenem  1 g Intravenous Daily  . metoprolol  5 mg Intravenous Once  . metoprolol tartrate  25 mg Oral BID   Continuous Infusions: . sodium chloride 1,000 mL (06/11/15 0200)  . amiodarone 30 mg/hr (06/11/15 1200)  . diltiazem (CARDIZEM) infusion 5 mg/hr (06/11/15 1200)  . sodium chloride 0.9 % 1,000 mL infusion Stopped (06/10/15 0600)    Time spent on care of this patient: 35 min   Velvet Bathe, MD 06/11/2015, 1:43 PM  LOS: 4 days   [facility] Office  [phone] Pager - Text Page per [email or web address] If 7PM-7AM, please contact night-coverage [email or web address]

## 2015-06-11 NOTE — Consult Note (Signed)
WOC wound consult note Reason for Consult:Midline abdominal wound; seen with CCS NP. Agree that wound is suitable for NPWT Wound type:surgical Pressure Ulcer POA: No Measurement:29cm x 5cm x 6.5cm Wound bed:red, moist Drainage (amount, consistency, odor) serous Periwound:intact, clear Dressing procedure/placement/frequency: Wound cleansed with NS, and gently patted dry.  Defect is filled with black foam-it requires 5 pieces today. Covered with drape and attached to negative pressure (127mmHG continuous).  An immediate seal is achieved and patient tolerated well.  WOC ostomy follow up Stoma type/location: LLQ Colostomy Stomal assessment/size: 1 and 5/8 inch oval, red, moist, slightly budded Peristomal assessment: intact, clear Treatment options for stomal/peristomal skin: skin barrier ring Output scant serosanguinous, no stool, no flatus Ostomy pouching: 2pc. 2 and 3/4 inch pouching system with skin barrier ring Education provided: Patient understands that colostomy supplies are odorproof and waterproof, that she will be able to bathe and shower as she did before surgery.  She understand that there are [facility] Nurses available to her and that we will be seeing her throughout her stay in acute care to education on stoma management.  She is tearful today and overwhelmed by wound and stoma. Enrolled patient in [place] Start Discharge program: No  WOC nursing team will follow, and will remain available to this patient, the nursing, surgical and medical teams.   Thanks, Maudie Flakes, MSN, RN, [place], [place], [place] [phone])

## 2015-06-11 NOTE — Progress Notes (Signed)
2 Days Post-Op  Subjective:  In good spirits this morning.  She states she is hungry and feeling gas rumbling in her abdomen.  No stool or flatus per ostomy yet. Did not get out of bed yesterday Denies chest pain or dyspnea.  Currently on amiodarone drip.  Cardizem drip held because of BP.  Rate controlled. Cardiology consultation and management greatly appreciated.  Wound and ostomy nursing consult  also greatly appreciated.  Fluid balance is quite positive.  Urine output adequate. Creatinine stable, 1.32.  Potassium 4.5.  Glucose 117.  Hemoglobin 9.5.  WBC 13,900.   Objective: Vital signs in last 24 hours: Temp:  [98.6 F (37 C)-99.3 F (37.4 C)] 99 F (37.2 C) (07/08 0700) Pulse Rate:  [25-146] 80 (07/08 0700) Resp:  [8-37] 10 (07/08 0700) BP: (97-135)/(61-96) 118/72 mmHg (07/08 0700) SpO2:  [86 %-100 %] 92 % (07/08 0700) Weight:  [131.4 kg (289 lb 11 oz)] 131.4 kg (289 lb 11 oz) (07/08 0600) Last BM Date: 06/06/15  Intake/Output from previous day: 07/07 0701 - 07/08 0700 In: 3848.8 [I.V.:3298.8; IV Piggyback:550] Out: 645 [Urine:645] Intake/Output this shift:    General appearance: Alert.  Cooperative.  No distress. Resp: clear to auscultation bilaterally GI: Abdomen soft.  Not distended.  Somewhat tender but significant improvement.  Midline wound clean.  Fascia intact.  Colostomy pink.  No output.  Lab Results:  Results for orders placed or performed during the hospital encounter of 06/07/15 (from the past 24 hour(s))  CBC     Status: Abnormal   Collection Time: 06/11/15 12:43 AM  Result Value Ref Range   WBC 13.9 (H) 4.0 - 10.5 K/uL   RBC 3.28 (L) 3.87 - 5.11 MIL/uL   Hemoglobin 9.5 (L) 12.0 - 15.0 g/dL   HCT 28.7 (L) 36.0 - 46.0 %   MCV 87.5 78.0 - 100.0 fL   MCH 29.0 26.0 - 34.0 pg   MCHC 33.1 30.0 - 36.0 g/dL   RDW 15.2 11.5 - 15.5 %   Platelets 402 (H) 150 - 400 K/uL  Basic metabolic panel     Status: Abnormal   Collection Time: 06/11/15  3:40 AM   Result Value Ref Range   Sodium 143 135 - 145 mmol/L   Potassium 4.5 3.5 - 5.1 mmol/L   Chloride 117 (H) 101 - 111 mmol/L   CO2 19 (L) 22 - 32 mmol/L   Glucose, Bld 117 (H) 65 - 99 mg/dL   BUN 33 (H) 6 - 20 mg/dL   Creatinine, Ser 1.32 (H) 0.44 - 1.00 mg/dL   Calcium 8.0 (L) 8.9 - 10.3 mg/dL   GFR calc non Af Amer 44 (L) >60 mL/min   GFR calc Af Amer 51 (L) >60 mL/min   Anion gap 7 5 - 15  TSH     Status: None   Collection Time: 06/11/15  3:40 AM  Result Value Ref Range   TSH 0.579 0.350 - 4.500 uIU/mL  Magnesium     Status: Abnormal   Collection Time: 06/11/15  3:40 AM  Result Value Ref Range   Magnesium 2.6 (H) 1.7 - 2.4 mg/dL     Studies/Results: No results found.  Marland Kitchen antiseptic oral rinse  7 mL Mouth Rinse BID  . enoxaparin (LOVENOX) injection  60 mg Subcutaneous Q24H  . ertapenem  1 g Intravenous Daily  . metoprolol  5 mg Intravenous Once  . metoprolol tartrate  25 mg Oral BID     Assessment/Plan: s/p Procedure(s): SIGMOID COLECTOMY, COLOSTOMY,  TAKE DOWN SPLENIC FLECTURE  Ruptured diverticulitis with peritonitis. POD #2. Laparotomy, sigmoid colectomy with takedown splenic flexure, end colostomy. Doing reasonably well Mobilize out of bed Continue IV antibiotic's (Invanz) DC Foley Allow clear liquids PT consult Begin twice a day dressing changes.  Consider negative pressure dressing. Meets criteria for transfer to MedSurg bed except for titration of cardiac drips.  Atrial fibrillation with RVR. Good rate control on amiodarone. Transfer to telemetry bed when okay with cardiology.  Sepsis with transient hypotension, leukocytosis, and volume depletion. She's becoming more euvolemic now. Continue close monitoring monitoring of renal function. May eventually need diuresis.  Renal failure. Nonoliguric. Stabilizing.  DVT prophylaxis:    onLovenox .  @PROBHOSP @  LOS: 4 days    Stepfon Rawles M 06/11/2015  . .prob

## 2015-06-11 NOTE — Progress Notes (Signed)
Attempted to get patient out of bed today. After sitting up on the side of the bed, she felt dizzy and refused the bedside chair. Offered to use the sky lift but she still refused, pleasantly.  Discussed with the importance of mobility and incentive spirometry with the patient.  She verbalized understanding.

## 2015-06-11 NOTE — Progress Notes (Signed)
    Subjective:  Sitting up in bed, no complaints  Objective:  Vital Signs in the last 24 hours: Temp:  [98.6 F (37 C)-99.1 F (37.3 C)] 98.8 F (37.1 C) (07/08 0800) Pulse Rate:  [25-146] 78 (07/08 0800) Resp:  [8-37] 11 (07/08 0800) BP: (97-135)/(61-90) 103/73 mmHg (07/08 0800) SpO2:  [86 %-100 %] 93 % (07/08 0800) Weight:  [289 lb 11 oz (131.4 kg)] 289 lb 11 oz (131.4 kg) (07/08 0600)  Intake/Output from previous day:  Intake/Output Summary (Last 24 hours) at 06/11/15 0932 Last data filed at 06/11/15 0857  Gross per 24 hour  Intake 3892.24 ml  Output    795 ml  Net 3097.24 ml    Physical Exam: General appearance: alert, cooperative, no distress and moderately obese Neck: no carotid bruit and no JVD Lungs: clear to auscultation bilaterally Heart: irregularly irregular rhythm   Rate: 80  Rhythm: atrial fibrillation  Lab Results:  Recent Labs  06/10/15 0349 06/11/15 0043  WBC 14.0* 13.9*  HGB 10.6* 9.5*  PLT 389 402*    Recent Labs  06/10/15 0349 06/11/15 0340  NA 141 143  K 4.4 4.5  CL 115* 117*  CO2 18* 19*  GLUCOSE 145* 117*  BUN 27* 33*  CREATININE 1.35* 1.32*    Recent Labs  06/09/15 0335  TROPONINI <0.03   No results for input(s): INR in the last 72 hours.  Scheduled Meds: . antiseptic oral rinse  7 mL Mouth Rinse BID  . enoxaparin (LOVENOX) injection  60 mg Subcutaneous Q24H  . ertapenem  1 g Intravenous Daily  . metoprolol  5 mg Intravenous Once  . metoprolol tartrate  25 mg Oral BID   Continuous Infusions: . sodium chloride 1,000 mL (06/11/15 0200)  . amiodarone 30 mg/hr (06/11/15 0859)  . diltiazem (CARDIZEM) infusion 5 mg/hr (06/11/15 0800)  . sodium chloride 0.9 % 1,000 mL infusion Stopped (06/10/15 0600)   PRN Meds:.alum & mag hydroxide-simeth, HYDROmorphone (DILAUDID) injection, ondansetron **OR** ondansetron (ZOFRAN) IV, promethazine   Imaging: Imaging results have been reviewed  Cardiac Studies: Echo 06/10/15 Study  Conclusions  - Left ventricle: Patient in rapid atrial f/utter during study. The cavity size was normal. Wall thickness was normal. Systolic function was normal. The estimated ejection fraction was in the range of 50% to 55%. - Left atrium: The atrium was mildly dilated. - Right ventricle: The cavity size was moderately dilated. - Right atrium: The atrium was mildly dilated. - Pericardium, extracardiac: A trivial pericardial effusion was identified.   Assessment/Plan:  59 y.o. 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 She was admitted 06/07/15 with a ruptured diverticula and peritonitis. She underwent surgery 06/09/15. Post op she developed atrial flutter with variable block and rapid VR.   Principal Problem:   Diverticulitis of colon with perforation Active Problems:   Peritonitis   Atrial fibrillation with RVR   HTN (hypertension)   Acute renal insufficiency   Leukocytosis   Obesity (BMI 39)   Arthritis- on disability   PLAN: Diltiazem stopped secondary to low B/P. Currently she has adequate rate control on IV Amiodarone and Lopressor 25 mg BID. She is on full dose Lovenox. OK to transfer to telemetry from our standpoint. Change to po Amiodarone and NOAC when its clear she is tolerating po.   [employer] PA-C 06/11/2015, 9:32 AM [phone]

## 2015-06-11 NOTE — Progress Notes (Signed)
ANTICOAGULATION CONSULT NOTE   Pharmacy Consult for Lovenox Indication: Afib  No Known Allergies  Patient Measurements: Height: 5\' 9"  (175.3 cm) Weight: 289 lb 11 oz (131.4 kg) IBW/kg (Calculated) : 66.2  Vital Signs: Temp: 98.8 F (37.1 C) (07/08 0800) Temp Source: Core (Comment) (07/08 0800) BP: 115/77 mmHg (07/08 1056) Pulse Rate: 78 (07/08 1056)  Labs:  Recent Labs  06/09/15 0335 06/09/15 1403 06/09/15 2235 06/10/15 0349 06/11/15 0043 06/11/15 0340  HGB 10.5* 11.5*  --  10.6* 9.5*  --   HCT 32.3* 34.7*  --  32.8* 28.7*  --   PLT 355 374  --  389 402*  --   CREATININE 1.40* 1.25* 1.25* 1.35*  --  1.32*  TROPONINI <0.03  --   --   --   --   --     Estimated Creatinine Clearance: 68.5 mL/min (by C-G formula based on Cr of 1.32).    Assessment: 19 yoF admitted 7/4 with abdominal pain x 2 days.  CT shows ruptured diverticulitis with peritonitis.  She underwent exploratory laparotomy, sigmoid colectomy w/ colostomy, and takedown splenic flexure on 7/6.  Pharmacy is now consulted to dose Lovenox for VTE prophylaxis. Her last dose of Heparin 5000 units SQ for VTE prophylaxis was given 7/5 at 2200, then held prior to OR. Of note, she went into Afib with RVR post-op overnight.  CCS to discuss with [facility].  Today, 06/11/2015: CBC: Hgb drifted down to 9.5, Plt remain WNL on POD2 No bleeding or complications reported. Renal: SCr stable with CrCl ~ 69 ml/min BMI 39  Goal of Therapy:  AntiXa Level 0.6-1 units/mL Monitor platelets by anticoagulation protocol: Yes   Plan:  1) Change Lovenox from ppx to 1mg /kg (130mg ) SQ q12 2) Monitor for bleeding and CBC  Adrian Saran, PharmD, BCPS Pager [phone] 06/11/2015 1:08 PM

## 2015-06-12 DIAGNOSIS — I4891 Unspecified atrial fibrillation: Secondary | ICD-10-CM

## 2015-06-12 LAB — BASIC METABOLIC PANEL
ANION GAP: 6 (ref 5–15)
BUN: 28 mg/dL — ABNORMAL HIGH (ref 6–20)
CO2: 22 mmol/L (ref 22–32)
CREATININE: 1.16 mg/dL — AB (ref 0.44–1.00)
Calcium: 8.3 mg/dL — ABNORMAL LOW (ref 8.9–10.3)
Chloride: 116 mmol/L — ABNORMAL HIGH (ref 101–111)
GFR calc Af Amer: 59 mL/min — ABNORMAL LOW (ref >60)
GFR calc non Af Amer: 51 mL/min — ABNORMAL LOW (ref >60)
Glucose, Bld: 113 mg/dL — ABNORMAL HIGH (ref 65–99)
Potassium: 4 mmol/L (ref 3.5–5.1)
SODIUM: 144 mmol/L (ref 135–145)

## 2015-06-12 LAB — CBC
HEMATOCRIT: 31.2 % — AB (ref 36.0–46.0)
HEMOGLOBIN: 9.9 g/dL — AB (ref 12.0–15.0)
MCH: 28.8 pg (ref 26.0–34.0)
MCHC: 31.7 g/dL (ref 30.0–36.0)
MCV: 90.7 fL (ref 78.0–100.0)
Platelets: 489 10*3/uL — ABNORMAL HIGH (ref 150–400)
RBC: 3.44 MIL/uL — AB (ref 3.87–5.11)
RDW: 15.8 % — ABNORMAL HIGH (ref 11.5–15.5)
WBC: 12.4 10*3/uL — ABNORMAL HIGH (ref 4.0–10.5)

## 2015-06-12 MED ORDER — PROMETHAZINE HCL 25 MG/ML IJ SOLN
6.2500 mg | INTRAMUSCULAR | Status: DC | PRN
  Administered 2015-06-12 – 2015-06-14 (×11): 12.5 mg via INTRAVENOUS
  Administered 2015-06-15: 25 mg via INTRAVENOUS
  Administered 2015-06-15 – 2015-06-17 (×6): 12.5 mg via INTRAVENOUS
  Filled 2015-06-12 (×18): qty 1

## 2015-06-12 MED ORDER — ACETAMINOPHEN 500 MG PO TABS
1000.0000 mg | ORAL_TABLET | Freq: Three times a day (TID) | ORAL | Status: DC
  Administered 2015-06-12 – 2015-06-18 (×18): 1000 mg via ORAL
  Filled 2015-06-12 (×25): qty 2

## 2015-06-12 MED ORDER — SODIUM CHLORIDE 0.9 % IV SOLN
250.0000 mL | INTRAVENOUS | Status: DC | PRN
  Administered 2015-06-13: 250 mL via INTRAVENOUS

## 2015-06-12 MED ORDER — LACTATED RINGERS IV BOLUS (SEPSIS): 1000.0000 mL | Freq: Three times a day (TID) | INTRAVENOUS | Status: AC | PRN

## 2015-06-12 MED ORDER — PHENOL 1.4 % MT LIQD: 2.0000 | OROMUCOSAL | Status: DC | PRN

## 2015-06-12 MED ORDER — MAGIC MOUTHWASH
15.0000 mL | Freq: Four times a day (QID) | ORAL | Status: DC | PRN
  Administered 2015-06-12: 15 mL via ORAL
  Filled 2015-06-12 (×2): qty 15

## 2015-06-12 MED ORDER — AMIODARONE HCL 200 MG PO TABS
200.0000 mg | ORAL_TABLET | Freq: Two times a day (BID) | ORAL | Status: DC
  Administered 2015-06-12 – 2015-06-18 (×13): 200 mg via ORAL
  Filled 2015-06-12 (×13): qty 1

## 2015-06-12 MED ORDER — BISACODYL 10 MG RE SUPP: 10.0000 mg | Freq: Two times a day (BID) | RECTAL | Status: DC | PRN

## 2015-06-12 MED ORDER — SODIUM CHLORIDE 0.9 % IJ SOLN: 3.0000 mL | INTRAMUSCULAR | Status: DC | PRN

## 2015-06-12 MED ORDER — DIPHENHYDRAMINE HCL 50 MG/ML IJ SOLN: 12.5000 mg | Freq: Four times a day (QID) | INTRAMUSCULAR | Status: DC | PRN

## 2015-06-12 MED ORDER — LIP MEDEX EX OINT
1.0000 "application " | TOPICAL_OINTMENT | Freq: Two times a day (BID) | CUTANEOUS | Status: DC
  Administered 2015-06-12 – 2015-06-18 (×9): 1 via TOPICAL
  Filled 2015-06-12: qty 7

## 2015-06-12 MED ORDER — MENTHOL 3 MG MT LOZG: 1.0000 | LOZENGE | OROMUCOSAL | Status: DC | PRN

## 2015-06-12 MED ORDER — SODIUM CHLORIDE 0.9 % IJ SOLN
3.0000 mL | Freq: Two times a day (BID) | INTRAMUSCULAR | Status: DC
  Administered 2015-06-12 – 2015-06-13 (×3): 3 mL via INTRAVENOUS

## 2015-06-12 NOTE — Progress Notes (Signed)
[facility] Progress Note   CYENTHIA SPEGAL [id number] DOB: 03-03-1956 DOA: 06/07/2015 PCP: [name], MD  Brief narrative: Deanna Aguilar is a 59 y.o. female with hypertension who presents with abdominal pain for 2 days. She points to the suprapubic area and states that it radiates from there to the right and left upper abdomen into her back. She feels pain when she tries to urinate as well. Pain quite severe. She has had nausea but no vomiting. She has had no appetite. She's felt hot and cold but has not checked her temperature. He has been taking Aleve and BC powders help control pain.    Subjective: Pt has no new complaints. States she feels better. Had some emesis last night. Tolerating fluids this morning po.  Assessment/Plan: Principal Problem:  Diverticulitis of colon with perforation/leukocytosis/peritonitis - General surgery on board and assisting - continue IV antibiotic (invanz) - supportive therapy  Atrial fibrillation with rvr - Cardiology on board and managing. Plan is to continue anticoagulation on discharge and currently and transition to oral amiodarone once patient is tolerating her oral intake. Will consult pharmacist to assist with transition.  Active Problems:  Sepsis - Hypotension/ leukocytosis - Resolving on current antibiotics therapy. WBC trending down  Renal failure - improving after IV fluid infusion and continue IV antibiotic administration. -May also be related to NSAIDs and ACE inhibitor along with a prerenal state -Hold ACE inhibitor  Code Status: Full code Family Communication:  Disposition Plan: per sugery  DVT prophylaxis: Heparin Consultants:surgery  Procedures:  Antibiotics: Anti-infectives    Start     Dose/Rate Route Frequency Ordered Stop   06/08/15 0615  ertapenem (INVANZ) 1 g in sodium chloride 0.9 % 50 mL IVPB     1 g 100 mL/hr over 30 Minutes Intravenous Daily 06/08/15 0609 06/17/15 0959   06/07/15 2359   ciprofloxacin (CIPRO) IVPB 400 mg  Status:  Discontinued     400 mg 200 mL/hr over 60 Minutes Intravenous Every 12 hours 06/07/15 1524 06/08/15 0609   06/07/15 1500  metroNIDAZOLE (FLAGYL) IVPB 500 mg  Status:  Discontinued     500 mg 100 mL/hr over 60 Minutes Intravenous Every 8 hours 06/07/15 1459 06/08/15 0609   06/07/15 1300  ciprofloxacin (CIPRO) IVPB 400 mg     400 mg 200 mL/hr over 60 Minutes Intravenous  Once 06/07/15 1258 06/07/15 1422   06/07/15 1300  metroNIDAZOLE (FLAGYL) IVPB 500 mg     500 mg 100 mL/hr over 60 Minutes Intravenous  Once 06/07/15 1258 06/07/15 1422      Objective: Filed Weights   06/07/15 1615 06/11/15 0600 06/12/15 0400  Weight: 121.1 kg (266 lb 15.6 oz) 131.4 kg (289 lb 11 oz) 133.9 kg (295 lb 3.1 oz)    Intake/Output Summary (Last 24 hours) at 06/12/15 1017 Last data filed at 06/12/15 0700  Gross per 24 hour  Intake 3211.12 ml  Output    200 ml  Net 3011.12 ml     Vitals Filed Vitals:   06/12/15 0400 06/12/15 0500 06/12/15 0600 06/12/15 0700  BP: 178/79 129/87 132/79 123/50  Pulse: 78 68 67 79  Temp: 98.3 F (36.8 C)     TempSrc: Oral     Resp: 18 8 9 19   Height:      Weight: 133.9 kg (295 lb 3.1 oz)     SpO2: 100% 97% 96% 95%    Exam:  General:  Pt is alert, in NAD  HEENT: No icterus, No thrush,  oral mucosa moist  Cardiovascular: regular rate and rhythm, S1/S2 No murmur  Respiratory: clear to auscultation bilaterally , no wheezes  Abdomen: Soft, non distended, no guarding  MSK: No LE edema, cyanosis or clubbing  Data Reviewed: Basic Metabolic Panel:  Recent Labs Lab 06/09/15 0335 06/09/15 1403 06/09/15 2235 06/10/15 0349 06/11/15 0340 06/12/15 0417  NA 140 141 141 141 143 144  K 3.7 4.1 4.3 4.4 4.5 4.0  CL 113* 113* 114* 115* 117* 116*  CO2 19* 20* 19* 18* 19* 22  GLUCOSE 91 110* 137* 145* 117* 113*  BUN 27* 23* 25* 27* 33* 28*  CREATININE 1.40* 1.25* 1.25* 1.35* 1.32* 1.16*  CALCIUM 7.9* 7.8* 7.6* 7.7* 8.0*  8.3*  MG 1.8  --  1.9  --  2.6*  --    Liver Function Tests: No results for input(s): AST, ALT, ALKPHOS, BILITOT, PROT, ALBUMIN in the last 168 hours. No results for input(s): LIPASE, AMYLASE in the last 168 hours. No results for input(s): AMMONIA in the last 168 hours. CBC:  Recent Labs Lab 06/07/15 1018  06/09/15 0335 06/09/15 1403 06/10/15 0349 06/11/15 0043 06/12/15 0417  WBC 14.8*  < > 13.4* 14.9* 14.0* 13.9* 12.4*  NEUTROABS 12.8*  --   --   --   --   --   --   HGB 12.6  < > 10.5* 11.5* 10.6* 9.5* 9.9*  HCT 37.5  < > 32.3* 34.7* 32.8* 28.7* 31.2*  MCV 87.4  < > 88.5 88.1 87.5 87.5 90.7  PLT 336  < > 355 374 389 402* 489*  < > = values in this interval not displayed. Cardiac Enzymes:  Recent Labs Lab 06/09/15 0335  TROPONINI <0.03   BNP (last 3 results) No results for input(s): BNP in the last 8760 hours.  ProBNP (last 3 results) No results for input(s): PROBNP in the last 8760 hours.  CBG: No results for input(s): GLUCAP in the last 168 hours.  Recent Results (from the past 240 hour(s))  MRSA PCR Screening     Status: None   Collection Time: 06/07/15  4:13 PM  Result Value Ref Range Status   MRSA by PCR NEGATIVE NEGATIVE Final    Comment:        The GeneXpert MRSA Assay (FDA approved for NASAL specimens only), is one component of a comprehensive MRSA colonization surveillance program. It is not intended to diagnose MRSA infection nor to guide or monitor treatment for MRSA infections.      Studies: No results found.  Scheduled Meds:  Scheduled Meds: . acetaminophen  1,000 mg Oral TID  . amiodarone  200 mg Oral BID  . antiseptic oral rinse  7 mL Mouth Rinse BID  . enoxaparin (LOVENOX) injection  1 mg/kg Subcutaneous Q12H  . ertapenem  1 g Intravenous Daily  . lip balm  1 application Topical BID  . metoprolol  5 mg Intravenous Once  . metoprolol tartrate  25 mg Oral BID  . sodium chloride  3 mL Intravenous Q12H   Continuous Infusions: .  sodium chloride 0.9 % 1,000 mL infusion Stopped (06/10/15 0600)    Time spent on care of this patient: 75 min   Velvet Bathe, MD 06/12/2015, 10:17 AM  LOS: 5 days   [facility] Office  [phone] Pager - Text Page per [email or web address] If 7PM-7AM, please contact night-coverage [email or web address]

## 2015-06-12 NOTE — Progress Notes (Signed)
PT Cancellation Note  Patient Details Name: Deanna Aguilar MRN: [id number] DOB: December 30, 1955   Cancelled Treatment:    Reason Eval/Treat Not Completed: Patient declined, no reason specified (pt admanantly refuses stating she wants to "rest")   [facility] 06/12/2015, 9:59 AM

## 2015-06-12 NOTE — Progress Notes (Signed)
Patient expressing sadness about hospitalization. Emotional support provided. At patient's agreement, 2 drops lavender eo placed on gauze in medicine cup and given to patient for inhalation aromatherapy. Lind Guest, RN

## 2015-06-12 NOTE — Progress Notes (Signed)
    Subjective:  Depressed mood. No CP, no SOB. Converted.   Objective:  Vital Signs in the last 24 hours: Temp:  [98.1 F (36.7 C)-98.9 F (37.2 C)] 98.3 F (36.8 C) (07/09 0400) Pulse Rate:  [58-110] 79 (07/09 0700) Resp:  [7-25] 19 (07/09 0700) BP: (115-178)/(50-101) 123/50 mmHg (07/09 0700) SpO2:  [90 %-100 %] 95 % (07/09 0700) Weight:  [295 lb 3.1 oz (133.9 kg)] 295 lb 3.1 oz (133.9 kg) (07/09 0400)  Intake/Output from previous day: 07/08 0701 - 07/09 0700 In: 4011.2 [P.O.:420; I.V.:3541.2; IV Piggyback:50] Out: 425 [Urine:425]   Physical Exam: General: Well developed, well nourished, in no acute distress. Head:  Normocephalic and atraumatic. Lungs: Clear to auscultation and percussion. Heart: Normal S1 and S2.  No murmur, rubs or gallops.  Abdomen: soft, non-tender, positive bowel sounds. Obese Extremities: No clubbing or cyanosis. No edema. Neurologic: Alert and oriented x 3. Depressed mood    Lab Results:  Recent Labs  06/11/15 0043 06/12/15 0417  WBC 13.9* 12.4*  HGB 9.5* 9.9*  PLT 402* 489*    Recent Labs  06/11/15 0340 06/12/15 0417  NA 143 144  K 4.5 4.0  CL 117* 116*  CO2 19* 22  GLUCOSE 117* 113*  BUN 33* 28*  CREATININE 1.32* 1.16*    Imaging: No results found. Personally viewed.   Telemetry: NSR Personally viewed.     Cardiac Studies:  ECHO  - Left ventricle: Patient in rapid atrial f/utter during study. The cavity size was normal. Wall thickness was normal. Systolic function was normal. The estimated ejection fraction was in the range of 50% to 55%. - Left atrium: The atrium was mildly dilated. - Right ventricle: The cavity size was moderately dilated. - Right atrium: The atrium was mildly dilated. - Pericardium, extracardiac: A trivial pericardial effusion was identified.  Scheduled Meds: . antiseptic oral rinse  7 mL Mouth Rinse BID  . enoxaparin (LOVENOX) injection  1 mg/kg Subcutaneous Q12H  . ertapenem  1 g  Intravenous Daily  . metoprolol  5 mg Intravenous Once  . metoprolol tartrate  25 mg Oral BID   Continuous Infusions: . sodium chloride 125 mL (06/12/15 0410)  . amiodarone 30 mg/hr (06/12/15 0600)  . diltiazem (CARDIZEM) infusion Stopped (06/12/15 0735)  . 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  -Continue metoprolol 25 bid -Would continue anticoagulation (NOAC reasonable) for at least 4 weeks (post conversion)  Will sign off, please call if any questions.    SKAINS, [place] 06/12/2015, 8:15 AM

## 2015-06-12 NOTE — Progress Notes (Signed)
[facility]  [address] [place], [place] [address] Phone: [phone] FAX: [phone]   JONIECE SMOTHERMAN [phone] 1955-12-28   Problem List:   Principal Problem:   Diverticulitis of colon with perforation Active Problems:   HTN (hypertension)   Acute renal insufficiency   Leukocytosis   Peritonitis   Obesity (BMI 39)   Arthritis- on disability   Atrial fibrillation with RVR   3 Days Post-Op  Patient Name: Deanna Aguilar  Date of Surgery: 06/09/2015  Pre op Diagnosis: Ruptured diverticulitis with peritonitis  Post op Diagnosis: Same  Procedure: Exploratory laparotomy, sigmoid colectomy, takedown splenic flexure, end colostomy  Surgeon: Edsel Petrin. Dalbert Batman, M.D., FACS  Operative Findings: The patient had ruptured diverticulitis with purulent peritonitis. There was no fecal peritonitis. There are multiple loops of small bowel stuck to the sigmoid colon there is no evidence of any primary disease process of the small bowel. There was not a lot of fluid in the abdomen but a lot of inflammation. We were able to resect the diseased section of colon, staple off the proximal rectal stump, and perform an end colostomy with good vascular supply. This required takedown splenic flexure to get good mobilization to bring into her very thick abdominal wall. There did not appear to be any primary disease process of the rest of the colon or the small bowel. Exposure was quite difficult technically because of her very thick, very heavy abdominal wall.   Assessment  ILEUS SLOWLY RESOLVING  Plan:  Ruptured diverticulitis with peritonitis.  Mobilize out of bed Continue IV antibiotics (Invanz x 7 days postop.) Clear liquids - adv fulls as tolerated.  Do not force Wound vac  Atrial fibrillation with RVR. Good rate control on amiodarone. Transfer to telemetry  bed when okay with cardiology.  D/w Dr Marlou Porch - OK from his standpoint.  Defer to IMed  Sepsis resolved (had with transient hypotension, leukocytosis, ARF and volume depletion)  Continue close monitoring monitoring of renal function. May eventually need diuresis - try hold IVF & let autodiurese w PRN IVF backup.  No diuretics for now per cardiology Dr Marlou Porch  Renal failure. Nonoliguric. Resolving.  CVA/DVT prophylaxis: Card rec 1 month full anticoag proph given Afib episode (now resolved)  on Lovenox full dose w stable Hgb - OK to try PO med per surgery/cards  -VTE prophylaxis- SCDs, etc  -mobilize as tolerated to help recovery - PT/OT evals  Adin Hector, M.D., F.A.C.S. Gastrointestinal and Minimally Invasive Surgery [facility] [address], [place], [place] [address] [phone] Main / Paging   06/12/2015  Subjective:  Nauseated yesterday - less now Tol clears but appetite fair CNA in room, bathin g pt  Objective:  Vital signs:  Filed Vitals:   06/12/15 0400 06/12/15 0500 06/12/15 0600 06/12/15 0700  BP: 178/79 129/87 132/79 123/50  Pulse: 78 68 67 79  Temp: 98.3 F (36.8 C)     TempSrc: Oral     Resp: _0 Height:      Weight: 133.9 kg (295 lb 3.1 oz)     SpO2: 100% 97% 96% 95%    Last BM Date: 06/06/15  Intake/Output   Yesterday:  07/08 0701 - 07/09 0700 In: 4011.2 [P.O.:420; I.V.:3541.2; IV Piggyback:50] Out: 425 [Urine:425] This shift:     Bowel function:  Flatus: scant in bag  BM: scant in bag  Drain: n/a  Physical Exam:  General: Pt awake/alert/oriented  x4 in no acute distress Eyes: PERRL, normal EOM.  Sclera clear.  No icterus Neuro: CN II-XII intact w/o focal sensory/motor deficits. Lymph: No head/neck/groin lymphadenopathy Psych:  No delerium/psychosis/paranoia HENT: Normocephalic, Mucus membranes moist.  No thrush Neck: Supple, No tracheal deviation Chest: No chest wall pain w good  excursion CV:  Pulses intact.  Regular rhythm MS: Normal AROM mjr joints.  No obvious deformity Abdomen: Mildly firm but mostly soft.  Obese.  Mild/mod distended.  Wound vac clean.  Ostomy flat but pink.   Mildly tender at incisions only.  No evidence of peritonitis.  No incarcerated hernias. Ext:  SCDs BLE.  No mjr edema.  No cyanosis Skin: No petechiae / purpura  Results:   Labs: Results for orders placed or performed during the hospital encounter of 06/07/15 (from the past 48 hour(s))  CBC     Status: Abnormal   Collection Time: 06/11/15 12:43 AM  Result Value Ref Range   WBC 13.9 (H) 4.0 - 10.5 K/uL   RBC 3.28 (L) 3.87 - 5.11 MIL/uL   Hemoglobin 9.5 (L) 12.0 - 15.0 g/dL   HCT 28.7 (L) 36.0 - 46.0 %   MCV 87.5 78.0 - 100.0 fL   MCH 29.0 26.0 - 34.0 pg   MCHC 33.1 30.0 - 36.0 g/dL   RDW 15.2 11.5 - 15.5 %   Platelets 402 (H) 150 - 400 K/uL  Basic metabolic panel     Status: Abnormal   Collection Time: 06/11/15  3:40 AM  Result Value Ref Range   Sodium 143 135 - 145 mmol/L   Potassium 4.5 3.5 - 5.1 mmol/L   Chloride 117 (H) 101 - 111 mmol/L   CO2 19 (L) 22 - 32 mmol/L   Glucose, Bld 117 (H) 65 - 99 mg/dL   BUN 33 (H) 6 - 20 mg/dL   Creatinine, Ser 1.32 (H) 0.44 - 1.00 mg/dL   Calcium 8.0 (L) 8.9 - 10.3 mg/dL   GFR calc non Af Amer 44 (L) >60 mL/min   GFR calc Af Amer 51 (L) >60 mL/min    Comment: (NOTE) The eGFR has been calculated using the CKD EPI equation. This calculation has not been validated in all clinical situations. eGFR's persistently <60 mL/min signify possible Chronic Kidney Disease.    Anion gap 7 5 - 15  TSH     Status: None   Collection Time: 06/11/15  3:40 AM  Result Value Ref Range   TSH 0.579 0.350 - 4.500 uIU/mL  Magnesium     Status: Abnormal   Collection Time: 06/11/15  3:40 AM  Result Value Ref Range   Magnesium 2.6 (H) 1.7 - 2.4 mg/dL  CBC     Status: Abnormal   Collection Time: 06/12/15  4:17 AM  Result Value Ref Range   WBC 12.4 (H) 4.0  - 10.5 K/uL   RBC 3.44 (L) 3.87 - 5.11 MIL/uL   Hemoglobin 9.9 (L) 12.0 - 15.0 g/dL   HCT 31.2 (L) 36.0 - 46.0 %   MCV 90.7 78.0 - 100.0 fL   MCH 28.8 26.0 - 34.0 pg   MCHC 31.7 30.0 - 36.0 g/dL   RDW 15.8 (H) 11.5 - 15.5 %   Platelets 489 (H) 150 - 400 K/uL  Basic metabolic panel     Status: Abnormal   Collection Time: 06/12/15  4:17 AM  Result Value Ref Range   Sodium 144 135 - 145 mmol/L   Potassium 4.0 3.5 - 5.1 mmol/L   Chloride  116 (H) 101 - 111 mmol/L   CO2 22 22 - 32 mmol/L   Glucose, Bld 113 (H) 65 - 99 mg/dL   BUN 28 (H) 6 - 20 mg/dL   Creatinine, Ser 1.16 (H) 0.44 - 1.00 mg/dL   Calcium 8.3 (L) 8.9 - 10.3 mg/dL   GFR calc non Af Amer 51 (L) >60 mL/min   GFR calc Af Amer 59 (L) >60 mL/min    Comment: (NOTE) The eGFR has been calculated using the CKD EPI equation. This calculation has not been validated in all clinical situations. eGFR's persistently <60 mL/min signify possible Chronic Kidney Disease.    Anion gap 6 5 - 15    Imaging / Studies: No results found.  Medications / Allergies: per chart  Antibiotics: Anti-infectives    Start     Dose/Rate Route Frequency Ordered Stop   06/08/15 0615  ertapenem (INVANZ) 1 g in sodium chloride 0.9 % 50 mL IVPB     1 g 100 mL/hr over 30 Minutes Intravenous Daily 06/08/15 0609     06/07/15 2359  ciprofloxacin (CIPRO) IVPB 400 mg  Status:  Discontinued     400 mg 200 mL/hr over 60 Minutes Intravenous Every 12 hours 06/07/15 1524 06/08/15 0609   06/07/15 1500  metroNIDAZOLE (FLAGYL) IVPB 500 mg  Status:  Discontinued     500 mg 100 mL/hr over 60 Minutes Intravenous Every 8 hours 06/07/15 1459 06/08/15 0609   06/07/15 1300  ciprofloxacin (CIPRO) IVPB 400 mg     400 mg 200 mL/hr over 60 Minutes Intravenous  Once 06/07/15 1258 06/07/15 1422   06/07/15 1300  metroNIDAZOLE (FLAGYL) IVPB 500 mg     500 mg 100 mL/hr over 60 Minutes Intravenous  Once 06/07/15 1258 06/07/15 1422        Note: Portions of this report may  have been transcribed using voice recognition software. Every effort was made to ensure accuracy; however, inadvertent computerized transcription errors may be present.   Any transcriptional errors that result from this process are unintentional.     Adin Hector, M.D., F.A.C.S. Gastrointestinal and Minimally Invasive Surgery [facility] [address], [place],  [address] [phone] Main / Paging   06/12/2015  CARE TEAM:  PCP: [name], MD  Outpatient Care Team: Patient Care Team: [name], MD as PCP - General (Family Medicine)  Inpatient Treatment Team: Treatment Team: Attending Provider: Velvet Bathe, MD; Rounding Team: Md Edison Pace, MD; Rounding Team: Garner Gavel, MD; Registered Nurse: Kristie Cowman, Student-RN; Registered Nurse: Arna Medici, RN; Physical Therapist: Neil Crouch, PT; Registered Nurse: Jonna Coup, RN (Inactive)

## 2015-06-13 DIAGNOSIS — F32A Depression, unspecified: Secondary | ICD-10-CM

## 2015-06-13 DIAGNOSIS — Z933 Colostomy status: Secondary | ICD-10-CM

## 2015-06-13 DIAGNOSIS — F329 Major depressive disorder, single episode, unspecified: Secondary | ICD-10-CM

## 2015-06-13 MED ORDER — METOCLOPRAMIDE HCL 10 MG PO TABS
5.0000 mg | ORAL_TABLET | Freq: Four times a day (QID) | ORAL | Status: DC | PRN
  Administered 2015-06-17: 10 mg via ORAL
  Filled 2015-06-13: qty 1

## 2015-06-13 MED ORDER — POLYETHYLENE GLYCOL 3350 17 G PO PACK
17.0000 g | PACK | Freq: Every day | ORAL | Status: DC
  Administered 2015-06-13 – 2015-06-18 (×6): 17 g via ORAL
  Filled 2015-06-13 (×7): qty 1

## 2015-06-13 MED ORDER — METOCLOPRAMIDE HCL 5 MG/ML IJ SOLN
5.0000 mg | Freq: Four times a day (QID) | INTRAMUSCULAR | Status: DC | PRN
  Administered 2015-06-13 (×2): 10 mg via INTRAVENOUS
  Filled 2015-06-13 (×2): qty 2

## 2015-06-13 MED ORDER — SACCHAROMYCES BOULARDII 250 MG PO CAPS
250.0000 mg | ORAL_CAPSULE | Freq: Two times a day (BID) | ORAL | Status: DC
  Administered 2015-06-13 – 2015-06-18 (×11): 250 mg via ORAL
  Filled 2015-06-13 (×12): qty 1

## 2015-06-13 MED ORDER — DEXTROSE 5 % IV SOLN
1000.0000 mg | Freq: Four times a day (QID) | INTRAVENOUS | Status: DC | PRN
  Filled 2015-06-13: qty 10

## 2015-06-13 NOTE — Evaluation (Signed)
Physical Therapy Evaluation Patient Details Name: Deanna Aguilar MRN: [id number] DOB: 09-Jan-1956 Today's Date: 06/13/2015   History of Present Illness  59 y.o. female admitted with abdominal pain. Dx of diverticulitits,  s/p colectomy 06/09/15.   Clinical Impression  Pt admitted with above diagnosis. Pt currently with functional limitations due to the deficits listed below (see PT Problem List). Assisted pt OOB to recliner, she walked 5' with RW, distance limited by pain. Depending on progress, she may need ST-SNF as she has no assist available at home.  Pt will benefit from skilled PT to increase their independence and safety with mobility to allow discharge to the venue listed below.       Follow Up Recommendations SNF    Equipment Recommendations  Rolling walker with 5" wheels    Recommendations for Other Services       Precautions / Restrictions Precautions Precautions: Fall Precaution Comments: ostomy, wound vac, abdominal incision Restrictions Weight Bearing Restrictions: No      Mobility  Bed Mobility Overal bed mobility: Needs Assistance Bed Mobility: Sidelying to Sit;Rolling Rolling: Min assist Sidelying to sit: Mod assist       General bed mobility comments: instructed pt in log roll, verbal cues for technique, mod A to raise trunk to sit  Transfers Overall transfer level: Needs assistance Equipment used: Rolling walker (2 wheeled) Transfers: Sit to/from [facility] Sit to Stand: Min assist;From elevated surface Stand pivot transfers: Min assist       General transfer comment: from elevated bed, verbal cues for hand placement and set up positioning, pivoted to [facility]  Ambulation/Gait Ambulation/Gait assistance: Min guard Ambulation Distance (Feet): 5 Feet Assistive device: Rolling walker (2 wheeled) Gait Pattern/deviations: Step-to pattern;Decreased stride length   Gait velocity interpretation: Below normal speed for age/gender General  Gait Details: distance limited by fatigue  Stairs            Wheelchair Mobility    Modified Rankin (Stroke Patients Only)       Balance Overall balance assessment: Needs assistance   Sitting balance-Leahy Scale: Good       Standing balance-Leahy Scale: Fair                               Pertinent Vitals/Pain Pain Assessment: 0-10 Pain Score: 8  Pain Location: abdominal incision Pain Descriptors / Indicators: Sore Pain Intervention(s): RN gave pain meds during session    Home Living Family/patient expects to be discharged to:: Private residence Living Arrangements: Other (Comment) (landlord lives in home but isn't there very often) Available Help at Discharge: [place] Type of Home: House Home Access: Stairs to enter   Technical brewer of Steps: 3   Home Equipment: Cane - single point      Prior Function Level of Independence: Independent with assistive device(s)               Hand Dominance        Extremity/Trunk Assessment   Upper Extremity Assessment: Overall WFL for tasks assessed           Lower Extremity Assessment: Overall WFL for tasks assessed      Cervical / Trunk Assessment: Normal  Communication   Communication: No difficulties  Cognition Arousal/Alertness: Awake/alert Behavior During Therapy: WFL for tasks assessed/performed;Anxious Overall Cognitive Status: Within Functional Limits for tasks assessed  General Comments      Exercises        Assessment/Plan    PT Assessment Patient needs continued PT services  PT Diagnosis Difficulty walking;Acute pain   PT Problem List Decreased activity tolerance;Decreased balance;Decreased knowledge of use of DME;Decreased mobility;Obesity;Pain;Decreased safety awareness  PT Treatment Interventions DME instruction;Gait training;Functional mobility training;Therapeutic activities;Patient/family education;Therapeutic  exercise   PT Goals (Current goals can be found in the Care Plan section) Acute Rehab PT Goals Patient Stated Goal: to get stronger PT Goal Formulation: With patient Time For Goal Achievement: 06/27/15 Potential to Achieve Goals: Good    Frequency Min 3X/week   Barriers to discharge Decreased caregiver support doesn't have assist available upon DC    Co-evaluation               End of Session Equipment Utilized During Treatment: Gait belt Activity Tolerance: Patient limited by pain Patient left: in chair           Time: [id number] PT Time Calculation (min) (ACUTE ONLY): 38 min   Charges:   PT Evaluation $Initial PT Evaluation Tier I: 1 Procedure PT Treatments $Therapeutic Activity: 23-37 mins   PT G Codes:        Philomena Doheny 06/13/2015, 9:26 AM [phone]

## 2015-06-13 NOTE — Progress Notes (Signed)
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 Dalbert Batman, M.D., FACS  Operative Findings: The patient had ruptured diverticulitis with purulent peritonitis. There was no fecal peritonitis. There are multiple loops of small bowel stuck to the sigmoid colon there is no evidence of any primary disease process of the small bowel. There was not a lot of fluid in the abdomen but a lot of inflammation. We were able to resect the diseased section of colon, staple off the proximal rectal stump, and perform an end colostomy with good vascular supply. This required takedown splenic flexure to get good mobilization to bring into her very thick abdominal wall. There did not appear to be any primary disease process of the rest of the colon or the small bowel. Exposure was quite difficult technically because of her very thick, very heavy abdominal wall.  Diagnosis Colon, segmental resection, sigmoid - DIVERTICULITIS WITH PERFORATION AND PERICOLONIC ABSCESS. - TWO BENIGN LYMPH NODES. - NO EVIDENCE OF MALIGNANCY. Claudette Laws MD Pathologist, Electronic Signature (Case signed  06/10/2015)   Assessment  ILEUS SLOWLY RESOLVING  Plan:  Ruptured diverticulitis with peritonitis.  Mobilize out of bed - pt depressed & wants to just stay in chair - d/w her at length importance of getting up & reasoning.  Palliatve pain/nausea better for her Continue IV antibiotics (Invanz x 7 days postop.) Pureed as tolerated.  Solids if less nausea & better flatus/BM into colostomy Wound vac CT scan if worse/not improving by POD 7-10  Atrial fibrillation with RVR. Good rate control on amiodarone. Transfer to telemetry bed when okay with cardiology.  D/w Dr Marlou Porch - OK from his standpoint.  Defer to IMed  Sepsis resolved (had with transient hypotension, leukocytosis, ARF and volume depletion)  Renal failure. Nonoliguric. Resolving.Continue close monitoring monitoring of renal function. May eventually need diuresis - try hold IVF & let autodiurese w PRN IVF backup.  No diuretics for now per cardiology Dr Marlou Porch  Colostomy care / teaching - she is starting to come to terms w it & hopeful it can be reversed.  I stressed the importance of nutrition/exercise to recovery & be able to tolerate ostomy TD in ~63month   CVA/DVT prophylaxis: Card rec 1 month full anticoag proph given Afib episode (now resolved)  on Lovenox full dose w stable Hgb - OK to try PO med per surgery/cards  -VTE prophylaxis- SCDs, etc  -mobilize as tolerated to help recovery - PT/OT evals.  I talked to her about letting them her her recover.  GET HER UP!!!  I updated the patient's status to the patient.  Recommendations were made.  Questions were answered.  The patient expressed understanding & appreciation.  SAdin Hector M.D., F.A.C.S. Gastrointestinal and Minimally Invasive [facility] [address] [place]  [id number] [phone] Main / Paging   06/13/2015  Subjective:  Nauseated yesterday - less now Tol clears but appetite  fair Depressed.  Did not want to get up  Objective:  Vital signs:  Filed Vitals:   06/12/15 1300 06/12/15 1508 06/12/15 2059 06/13/15 0646  BP: 150/75 146/72 135/78 128/76  Pulse: 67 77 71 68  Temp:  99.4 F (37.4 C) 98.2 F (36.8 C) 98.5 F (36.9 C)  TempSrc:  Oral Oral Oral  Resp: 13 12 14 16   Height:      Weight:      SpO2: 97% 98% 98% 98%    Last BM Date: 06/06/15  Intake/Output   Yesterday:  07/09 0701 - 07/10 0700 In: 1160.1 [I.V.:1160.1] Out: 0  This shift:     Bowel function:  Flatus: scant in bag  BM: scant in bag  Drain: n/a  Physical Exam:  General: Pt awake/alert/oriented x4 in no acute distress Eyes: PERRL, normal EOM.  Sclera clear.  No icterus Neuro: CN II-XII intact w/o focal sensory/motor deficits. Lymph: No head/neck/groin lymphadenopathy Psych:  No delerium/psychosis/paranoia.  Sad but consolable.  Not tearful today HENT: Normocephalic, Mucus membranes moist.  No thrush Neck: Supple, No tracheal deviation Chest: No chest wall pain w good excursion CV:  Pulses intact.  Regular rhythm MS: Normal AROM mjr joints.  No obvious deformity Abdomen: Mildly firm but mostly soft.  Obese.  Mild/mod distended.  Wound vac clean.  Ostomy flat but pink.   + gas.  Scant stool.  Mildly tender at incisions only.  No evidence of peritonitis.  No incarcerated hernias. Ext:  SCDs BLE.  No mjr edema.  No cyanosis Skin: No petechiae / purpura  Results:   Labs: Results for orders placed or performed during the hospital encounter of 06/07/15 (from the past 48 hour(s))  CBC     Status: Abnormal   Collection Time: 06/12/15  4:17 AM  Result Value Ref Range   WBC 12.4 (H) 4.0 - 10.5 K/uL   RBC 3.44 (L) 3.87 - 5.11 MIL/uL   Hemoglobin 9.9 (L) 12.0 - 15.0 g/dL   HCT 31.2 (L) 36.0 - 46.0 %   MCV 90.7 78.0 - 100.0 fL   MCH 28.8 26.0 - 34.0 pg   MCHC 31.7 30.0 - 36.0 g/dL   RDW 15.8 (H) 11.5 - 15.5 %   Platelets 489 (H) 150 - 400 K/uL  Basic metabolic panel      Status: Abnormal   Collection Time: 06/12/15  4:17 AM  Result Value Ref Range   Sodium 144 135 - 145 mmol/L   Potassium 4.0 3.5 - 5.1 mmol/L   Chloride 116 (H) 101 - 111 mmol/L   CO2 22 22 - 32 mmol/L   Glucose, Bld 113 (H) 65 - 99 mg/dL   BUN 28 (H) 6 - 20 mg/dL   Creatinine, Ser 1.16 (H) 0.44 - 1.00 mg/dL   Calcium 8.3 (L) 8.9 - 10.3 mg/dL   GFR calc non Af Amer 51 (L) >60 mL/min   GFR calc Af Amer 59 (L) >60 mL/min    Comment: (NOTE) The eGFR has been calculated using the CKD EPI equation. This calculation has not been validated in all clinical situations. eGFR's persistently <60 mL/min signify possible Chronic Kidney Disease.    Anion gap 6 5 - 15    Imaging / Studies: No results found.  Medications / Allergies: per chart  Antibiotics: Anti-infectives    Start     Dose/Rate Route  Frequency Ordered Stop   06/08/15 0615  ertapenem (INVANZ) 1 g in sodium chloride 0.9 % 50 mL IVPB     1 g 100 mL/hr over 30 Minutes Intravenous Daily 06/08/15 0609 06/17/15 0959   06/07/15 2359  ciprofloxacin (CIPRO) IVPB 400 mg  Status:  Discontinued     400 mg 200 mL/hr over 60 Minutes Intravenous Every 12 hours 06/07/15 1524 06/08/15 0609   06/07/15 1500  metroNIDAZOLE (FLAGYL) IVPB 500 mg  Status:  Discontinued     500 mg 100 mL/hr over 60 Minutes Intravenous Every 8 hours 06/07/15 1459 06/08/15 0609   06/07/15 1300  ciprofloxacin (CIPRO) IVPB 400 mg     400 mg 200 mL/hr over 60 Minutes Intravenous  Once 06/07/15 1258 06/07/15 1422   06/07/15 1300  metroNIDAZOLE (FLAGYL) IVPB 500 mg     500 mg 100 mL/hr over 60 Minutes Intravenous  Once 06/07/15 1258 06/07/15 1422        Note: Portions of this report may have been transcribed using voice recognition software. Every effort was made to ensure accuracy; however, inadvertent computerized transcription errors may be present.   Any transcriptional errors that result from this process are unintentional.     Adin Hector, M.D.,  F.A.C.S. Gastrointestinal and Minimally Invasive Surgery [facility] [address], [place], [place] [address] [phone] Main / Paging   06/13/2015  CARE TEAM:  PCP: [name], MD  Outpatient Care Team: Patient Care Team: [name], MD as PCP - General (Family Medicine)  Inpatient Treatment Team: Treatment Team: Attending Provider: Velvet Bathe, MD; Rounding Team: Md Edison Pace, MD; Rounding Team: Garner Gavel, MD; Registered Nurse: Kristie Cowman, Student-RN; Registered Nurse: Arna Medici, RN; Registered Nurse: Lind Guest, RN; Technician: Worthy Keeler, NT; Physical Therapist: Lucile Crater, PT; Technician: Kirstie Peri, NT; Registered Nurse: Eli Hose, RN; Technician: Ennis Forts, NT; Registered Nurse: Mercy Moore, RN

## 2015-06-13 NOTE — Progress Notes (Signed)
Pt wound vac leaking around edge. Guaze and ABD pad placed over drainage area, Changed twice during shift. Will continue to monitor.

## 2015-06-13 NOTE — Progress Notes (Signed)
[facility] Progress Note   Deanna Aguilar [id number] DOB: 1956/10/28 DOA: 06/07/2015 PCP: [name], MD  Brief narrative: Deanna Aguilar is a 59 y.o. female with hypertension who presents with abdominal pain for 2 days. She points to the suprapubic area and states that it radiates from there to the right and left upper abdomen into her back. She feels pain when she tries to urinate as well. Pain quite severe. She has had nausea but no vomiting. She has had no appetite. She's felt hot and cold but has not checked her temperature. He has been taking Aleve and BC powders help control pain.    Subjective: Pt has no new complaints. States she feels better. Had some emesis last night. Tolerating fluids this morning po.  Assessment/Plan: Principal Problem:  Diverticulitis of colon with perforation/leukocytosis/peritonitis - General surgery on board and assisting - continue IV antibiotic (invanz) will discuss with ID next am for recommendations for antibiotics once patient is discharged. - supportive therapy  Atrial fibrillation with rvr - Cardiology on board and managing. Plan is to continue anticoagulation on discharge. Currently on oral amiodarone.  Active Problems:  Sepsis - Hypotension/ leukocytosis - Resolving on current antibiotics therapy. WBC trending down  Renal failure - improving after IV fluid infusion and continue IV antibiotic administration. -May also be related to NSAIDs and ACE inhibitor along with a prerenal state -Hold ACE inhibitor  Code Status: Full code Family Communication:  Disposition Plan: per sugery  DVT prophylaxis: Heparin Consultants:surgery  Procedures:  Antibiotics: Anti-infectives    Start     Dose/Rate Route Frequency Ordered Stop   06/08/15 0615  ertapenem (INVANZ) 1 g in sodium chloride 0.9 % 50 mL IVPB     1 g 100 mL/hr over 30 Minutes Intravenous Daily 06/08/15 0609 06/17/15 0959   06/07/15 2359  ciprofloxacin (CIPRO)  IVPB 400 mg  Status:  Discontinued     400 mg 200 mL/hr over 60 Minutes Intravenous Every 12 hours 06/07/15 1524 06/08/15 0609   06/07/15 1500  metroNIDAZOLE (FLAGYL) IVPB 500 mg  Status:  Discontinued     500 mg 100 mL/hr over 60 Minutes Intravenous Every 8 hours 06/07/15 1459 06/08/15 0609   06/07/15 1300  ciprofloxacin (CIPRO) IVPB 400 mg     400 mg 200 mL/hr over 60 Minutes Intravenous  Once 06/07/15 1258 06/07/15 1422   06/07/15 1300  metroNIDAZOLE (FLAGYL) IVPB 500 mg     500 mg 100 mL/hr over 60 Minutes Intravenous  Once 06/07/15 1258 06/07/15 1422      Objective: Filed Weights   06/07/15 1615 06/11/15 0600 06/12/15 0400  Weight: 121.1 kg (266 lb 15.6 oz) 131.4 kg (289 lb 11 oz) 133.9 kg (295 lb 3.1 oz)    Intake/Output Summary (Last 24 hours) at 06/13/15 1333 Last data filed at 06/13/15 0900  Gross per 24 hour  Intake    120 ml  Output      0 ml  Net    120 ml     Vitals Filed Vitals:   06/12/15 1508 06/12/15 2059 06/13/15 0646 06/13/15 1313  BP: 146/72 135/78 128/76 129/72  Pulse: 77 71 68 83  Temp: 99.4 F (37.4 C) 98.2 F (36.8 C) 98.5 F (36.9 C) 98.2 F (36.8 C)  TempSrc: Oral Oral Oral Oral  Resp: 12 14 16 15   Height:      Weight:      SpO2: 98% 98% 98% 98%    Exam:  General:  Pt is  alert, in NAD  HEENT: No icterus, No thrush, oral mucosa moist  Cardiovascular: regular rate and rhythm, S1/S2 No murmur  Respiratory: clear to auscultation bilaterally , no wheezes  Abdomen: Soft, non distended, no guarding  MSK: No LE edema, cyanosis or clubbing  Data Reviewed: Basic Metabolic Panel:  Recent Labs Lab 06/09/15 0335 06/09/15 1403 06/09/15 2235 06/10/15 0349 06/11/15 0340 06/12/15 0417  NA 140 141 141 141 143 144  K 3.7 4.1 4.3 4.4 4.5 4.0  CL 113* 113* 114* 115* 117* 116*  CO2 19* 20* 19* 18* 19* 22  GLUCOSE 91 110* 137* 145* 117* 113*  BUN 27* 23* 25* 27* 33* 28*  CREATININE 1.40* 1.25* 1.25* 1.35* 1.32* 1.16*  CALCIUM 7.9* 7.8*  7.6* 7.7* 8.0* 8.3*  MG 1.8  --  1.9  --  2.6*  --    Liver Function Tests: No results for input(s): AST, ALT, ALKPHOS, BILITOT, PROT, ALBUMIN in the last 168 hours. No results for input(s): LIPASE, AMYLASE in the last 168 hours. No results for input(s): AMMONIA in the last 168 hours. CBC:  Recent Labs Lab 06/07/15 1018  06/09/15 0335 06/09/15 1403 06/10/15 0349 06/11/15 0043 06/12/15 0417  WBC 14.8*  < > 13.4* 14.9* 14.0* 13.9* 12.4*  NEUTROABS 12.8*  --   --   --   --   --   --   HGB 12.6  < > 10.5* 11.5* 10.6* 9.5* 9.9*  HCT 37.5  < > 32.3* 34.7* 32.8* 28.7* 31.2*  MCV 87.4  < > 88.5 88.1 87.5 87.5 90.7  PLT 336  < > 355 374 389 402* 489*  < > = values in this interval not displayed. Cardiac Enzymes:  Recent Labs Lab 06/09/15 0335  TROPONINI <0.03   BNP (last 3 results) No results for input(s): BNP in the last 8760 hours.  ProBNP (last 3 results) No results for input(s): PROBNP in the last 8760 hours.  CBG: No results for input(s): GLUCAP in the last 168 hours.  Recent Results (from the past 240 hour(s))  MRSA PCR Screening     Status: None   Collection Time: 06/07/15  4:13 PM  Result Value Ref Range Status   MRSA by PCR NEGATIVE NEGATIVE Final    Comment:        The GeneXpert MRSA Assay (FDA approved for NASAL specimens only), is one component of a comprehensive MRSA colonization surveillance program. It is not intended to diagnose MRSA infection nor to guide or monitor treatment for MRSA infections.      Studies: No results found.  Scheduled Meds:  Scheduled Meds: . acetaminophen  1,000 mg Oral TID  . amiodarone  200 mg Oral BID  . antiseptic oral rinse  7 mL Mouth Rinse BID  . enoxaparin (LOVENOX) injection  1 mg/kg Subcutaneous Q12H  . ertapenem  1 g Intravenous Daily  . lip balm  1 application Topical BID  . metoprolol  5 mg Intravenous Once  . metoprolol tartrate  25 mg Oral BID  . polyethylene glycol  17 g Oral Daily  . saccharomyces  boulardii  250 mg Oral BID  . sodium chloride  3 mL Intravenous Q12H   Continuous Infusions: . sodium chloride 0.9 % 1,000 mL infusion 30 mL/hr at 06/12/15 1100    Time spent on care of this patient: 35 min   Velvet Bathe, MD 06/13/2015, 1:33 PM  LOS: 6 days   [facility] Office  [phone] Pager - Text Page per [email or web address]  If 7PM-7AM, please contact night-coverage [email or web address]

## 2015-06-14 LAB — CBC
HCT: 29.7 % — ABNORMAL LOW (ref 36.0–46.0)
HEMOGLOBIN: 9.8 g/dL — AB (ref 12.0–15.0)
MCH: 29.9 pg (ref 26.0–34.0)
MCHC: 33 g/dL (ref 30.0–36.0)
MCV: 90.5 fL (ref 78.0–100.0)
PLATELETS: 466 10*3/uL — AB (ref 150–400)
RBC: 3.28 MIL/uL — AB (ref 3.87–5.11)
RDW: 15.2 % (ref 11.5–15.5)
WBC: 11.5 10*3/uL — ABNORMAL HIGH (ref 4.0–10.5)

## 2015-06-14 MED ORDER — SODIUM CHLORIDE 0.9 % IV SOLN
INTRAVENOUS | Status: DC
  Administered 2015-06-14 – 2015-06-17 (×9): via INTRAVENOUS

## 2015-06-14 MED ORDER — OXYCODONE-ACETAMINOPHEN 5-325 MG PO TABS
1.0000 | ORAL_TABLET | ORAL | Status: DC | PRN
Start: 2015-06-14 — End: 2015-06-16
  Administered 2015-06-14 – 2015-06-15 (×2): 2 via ORAL
  Administered 2015-06-16: 1 via ORAL
  Filled 2015-06-14: qty 1
  Filled 2015-06-14: qty 2
  Filled 2015-06-14: qty 1
  Filled 2015-06-14: qty 2

## 2015-06-14 MED ORDER — SILVER NITRATE-POT NITRATE 75-25 % EX MISC
1.0000 "application " | Freq: Once | CUTANEOUS | Status: AC
  Administered 2015-06-14: 1 via TOPICAL
  Filled 2015-06-14: qty 1

## 2015-06-14 NOTE — Progress Notes (Signed)
Pt just shook her head today and said she was not feeling well for a visit. Normally she enjoys Chaplain visit. Will refer for follow-up. Please page if assistance is needed. Deanna Aguilar Chaplain   06/12/15 0940  Clinical Encounter Type  Visited With Patient

## 2015-06-14 NOTE — Consult Note (Addendum)
WOC ostomy follow up Stoma type/location: LLQ Colostomy Stomal assessment/size: Visualized through pouch. System changed on Friday is intact.  Planned change is tomorrow with abdominal wound assessment. Peristomal assessment: not seen today Treatment options for stomal/peristomal skin: skin barrier ring Output brown stool Ostomy pouching: 2pc. 2 and 3/4 inch pouching system with skin barrier ring Education provided: Patient taught that pouching system is odor proof and waterproof.  She is tearful this morning stating that, "she is only one person" and "how can she manage this" . Reassured that she will learn about this ostomy for the time that she has it and provided her with another education booklet (one from ICU is not to be found in her current room). Patient is shown how to perform the Lock and Roll closure on the pouch and is provided a pouch upon which to practice. Bedside RN observes teaching and will follow up with patient today. [place] nurse will see tomorrow for abdominal wound assessment and pouch change. Enrolled patient in [facility] Start Discharge program: No WOC nursing team will follow, will remain available to this patient, the nursing and medical teams.  Thanks, Maudie Flakes, MSN, RN, [place], [place], [place] [phone])

## 2015-06-14 NOTE — Progress Notes (Signed)
[facility] Progress Note   Deanna Aguilar [id number] DOB: 06/12/1956 DOA: 06/07/2015 PCP: [name], MD  Brief narrative: Deanna Aguilar is a 59 y.o. female with hypertension who presents with abdominal pain for 2 days. She points to the suprapubic area and states that it radiates from there to the right and left upper abdomen into her back. She feels pain when she tries to urinate as well. Pain quite severe. She has had nausea but no vomiting. She has had no appetite. She's felt hot and cold but has not checked her temperature. He has been taking Aleve and BC powders help control pain.    Subjective: Pt has no new complaints. States she feels better. Had some emesis last night. Tolerating fluids this morning po.  Assessment/Plan: Principal Problem:  Diverticulitis of colon with perforation/leukocytosis/peritonitis - General surgery on board and managing: pt is s/p POD#5 exploratory laparotomy, sigmoid colectomy, takedown of splenic flexure, end colostomy---Dr. Dalbert Batman -Plan is to continue IV antibiotics for 2 more days to complete a total of 7 days post op - supportive therapy  Atrial fibrillation with rvr - Cardiology on board and managing. Plan is to continue anticoagulation on discharge. Currently on oral amiodarone.  Active Problems:  Sepsis - Hypotension/ leukocytosis - Resolving on current antibiotics therapy. WBC trending down  Renal failure - improving after IV fluid infusion and continue IV antibiotic administration. -May also be related to NSAIDs and ACE inhibitor along with a prerenal state -continue to hold ACE inhibitor  Code Status: Full code Family Communication:  Disposition Plan: per sugery  DVT prophylaxis: Heparin Consultants:surgery  Procedures:POD#5 exploratory laparotomy, sigmoid colectomy, takedown of splenic flexure, end colostomy---Dr. Dalbert Batman  Antibiotics: Anti-infectives    Start     Dose/Rate Route Frequency Ordered Stop   06/08/15 0615  ertapenem (INVANZ) 1 g in sodium chloride 0.9 % 50 mL IVPB     1 g 100 mL/hr over 30 Minutes Intravenous Daily 06/08/15 0609 06/17/15 0959   06/07/15 2359  ciprofloxacin (CIPRO) IVPB 400 mg  Status:  Discontinued     400 mg 200 mL/hr over 60 Minutes Intravenous Every 12 hours 06/07/15 1524 06/08/15 0609   06/07/15 1500  metroNIDAZOLE (FLAGYL) IVPB 500 mg  Status:  Discontinued     500 mg 100 mL/hr over 60 Minutes Intravenous Every 8 hours 06/07/15 1459 06/08/15 0609   06/07/15 1300  ciprofloxacin (CIPRO) IVPB 400 mg     400 mg 200 mL/hr over 60 Minutes Intravenous  Once 06/07/15 1258 06/07/15 1422   06/07/15 1300  metroNIDAZOLE (FLAGYL) IVPB 500 mg     500 mg 100 mL/hr over 60 Minutes Intravenous  Once 06/07/15 1258 06/07/15 1422      Objective: Filed Weights   06/07/15 1615 06/11/15 0600 06/12/15 0400  Weight: 121.1 kg (266 lb 15.6 oz) 131.4 kg (289 lb 11 oz) 133.9 kg (295 lb 3.1 oz)    Intake/Output Summary (Last 24 hours) at 06/14/15 1502 Last data filed at 06/13/15 2110  Gross per 24 hour  Intake  14.83 ml  Output    150 ml  Net -135.17 ml     Vitals Filed Vitals:   06/13/15 1313 06/13/15 2148 06/14/15 0607 06/14/15 1405  BP: 129/72 118/59 112/56 153/68  Pulse: 83 74 66 80  Temp: 98.2 F (36.8 C) 98.4 F (36.9 C) 98.6 F (37 C) 98.6 F (37 C)  TempSrc: Oral Oral Oral Oral  Resp: 15 16 12 14   Height:  Weight:      SpO2: 98% 98% 100% 98%    Exam:  General:  Pt is alert, in NAD  HEENT: No icterus, No thrush, oral mucosa moist  Cardiovascular: regular rate and rhythm, S1/S2 No murmur  Respiratory: clear to auscultation bilaterally , no wheezes  Abdomen: Soft, non distended, no guarding  MSK: No LE edema, cyanosis or clubbing  Data Reviewed: Basic Metabolic Panel:  Recent Labs Lab 06/09/15 0335 06/09/15 1403 06/09/15 2235 06/10/15 0349 06/11/15 0340 06/12/15 0417  NA 140 141 141 141 143 144  K 3.7 4.1 4.3 4.4 4.5 4.0  CL  113* 113* 114* 115* 117* 116*  CO2 19* 20* 19* 18* 19* 22  GLUCOSE 91 110* 137* 145* 117* 113*  BUN 27* 23* 25* 27* 33* 28*  CREATININE 1.40* 1.25* 1.25* 1.35* 1.32* 1.16*  CALCIUM 7.9* 7.8* 7.6* 7.7* 8.0* 8.3*  MG 1.8  --  1.9  --  2.6*  --    Liver Function Tests: No results for input(s): AST, ALT, ALKPHOS, BILITOT, PROT, ALBUMIN in the last 168 hours. No results for input(s): LIPASE, AMYLASE in the last 168 hours. No results for input(s): AMMONIA in the last 168 hours. CBC:  Recent Labs Lab 06/09/15 1403 06/10/15 0349 06/11/15 0043 06/12/15 0417 06/14/15 0924  WBC 14.9* 14.0* 13.9* 12.4* 11.5*  HGB 11.5* 10.6* 9.5* 9.9* 9.8*  HCT 34.7* 32.8* 28.7* 31.2* 29.7*  MCV 88.1 87.5 87.5 90.7 90.5  PLT 374 389 402* 489* 466*   Cardiac Enzymes:  Recent Labs Lab 06/09/15 0335  TROPONINI <0.03   BNP (last 3 results) No results for input(s): BNP in the last 8760 hours.  ProBNP (last 3 results) No results for input(s): PROBNP in the last 8760 hours.  CBG: No results for input(s): GLUCAP in the last 168 hours.  Recent Results (from the past 240 hour(s))  MRSA PCR Screening     Status: None   Collection Time: 06/07/15  4:13 PM  Result Value Ref Range Status   MRSA by PCR NEGATIVE NEGATIVE Final    Comment:        The GeneXpert MRSA Assay (FDA approved for NASAL specimens only), is one component of a comprehensive MRSA colonization surveillance program. It is not intended to diagnose MRSA infection nor to guide or monitor treatment for MRSA infections.      Studies: No results found.  Scheduled Meds:  Scheduled Meds: . acetaminophen  1,000 mg Oral TID  . amiodarone  200 mg Oral BID  . antiseptic oral rinse  7 mL Mouth Rinse BID  . enoxaparin (LOVENOX) injection  1 mg/kg Subcutaneous Q12H  . ertapenem  1 g Intravenous Daily  . lip balm  1 application Topical BID  . metoprolol  5 mg Intravenous Once  . metoprolol tartrate  25 mg Oral BID  . polyethylene  glycol  17 g Oral Daily  . saccharomyces boulardii  250 mg Oral BID  . silver nitrate applicators  1 application Topical Once  . sodium chloride  3 mL Intravenous Q12H   Continuous Infusions: . sodium chloride 150 mL/hr at 06/14/15 1008    Time spent on care of this patient: 35 min   Velvet Bathe, MD 06/14/2015, 3:02 PM  LOS: 7 days   [facility] Office  [phone] Pager - Text Page per [email or web address] If 7PM-7AM, please contact night-coverage [email or web address]

## 2015-06-14 NOTE — Progress Notes (Signed)
Patient ID: Deanna Aguilar, female   DOB: [birth year], 59 y.o.   MRN: [id number]     [place]      [address] [place], [place] [id number]    Phone: [phone] FAX: [phone]     Subjective: Says shes very depressed, not much family support as most work or live out of town.  Talking to chaplain.  VSS.  Afebrile.  Significant oozing around VAC.  Sat up in chair for 1.5 hours yesterday.  Appetite is good, but doesn't like the food.   Voiding.   Objective:  Vital signs:  Filed Vitals:   06/13/15 0646 06/13/15 1313 06/13/15 2148 06/14/15 0607  BP: 128/76 129/72 118/59 112/56  Pulse: 68 83 74 66  Temp: 98.5 F (36.9 C) 98.2 F (36.8 C) 98.4 F (36.9 C) 98.6 F (37 C)  TempSrc: Oral Oral Oral Oral  Resp: 16 15 16 12   Height:      Weight:      SpO2: 98% 98% 98% 100%    Last BM Date: 06/13/15 (smear)  Intake/Output   Yesterday:  07/10 0701 - 07/11 0700 In: 314.8 [P.O.:120; I.V.:94.8; IV Piggyback:100] Out: 150 [Stool:150] This shift: I/O last 3 completed shifts: In: 314.8 [P.O.:120; I.V.:94.8; IV Piggyback:100] Out: 150 [Stool:150]    Physical Exam: General: Pt awake/alert/oriented x4 in no acute distress Abdomen: Soft.  Nondistended.   Mildly tender at incisions only. Midline wound is beefy red, fascia is intact and clean.  Right wound edge, bleeding, manual pressure applied.  Wet to dry dressing applied.  No evidence of peritonitis.  No incarcerated hernias. Ostomy is functioning.    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   Atrial fibrillation with RVR   Depression, acute   Colostomy in place    Results:   Labs: No results found for this or any previous visit (from the past 69  hour(s)).  Imaging / Studies: No results found.  Medications / Allergies:  Scheduled Meds: . acetaminophen  1,000 mg Oral TID  . amiodarone  200 mg Oral BID  . antiseptic oral rinse  7 mL Mouth Rinse BID  . enoxaparin (LOVENOX) injection  1 mg/kg Subcutaneous Q12H  . ertapenem  1 g Intravenous Daily  . lip balm  1 application Topical BID  . metoprolol  5 mg Intravenous Once  . metoprolol tartrate  25 mg Oral BID  . polyethylene glycol  17 g Oral Daily  . saccharomyces boulardii  250 mg Oral BID  . sodium chloride  3 mL Intravenous Q12H   Continuous Infusions: . sodium chloride 0.9 % 1,000 mL infusion 30 mL/hr at 06/12/15 1100   PRN Meds:.sodium chloride, alum & mag hydroxide-simeth, diphenhydrAMINE, HYDROmorphone (DILAUDID) injection, lactated ringers, magic mouthwash, menthol-cetylpyridinium, methocarbamol (ROBAXIN)  IV, metoCLOPramide (REGLAN) injection, metoCLOPramide, ondansetron **OR** ondansetron (ZOFRAN) IV, phenol, promethazine, sodium chloride  Antibiotics: Anti-infectives    Start     Dose/Rate Route Frequency Ordered Stop   06/08/15 0615  ertapenem (INVANZ) 1 g in sodium chloride 0.9 % 50 mL IVPB     1 g 100 mL/hr over 30 Minutes Intravenous Daily 06/08/15 0609 06/17/15 0959   06/07/15 2359  ciprofloxacin (CIPRO) IVPB 400 mg  Status:  Discontinued     400 mg  200 mL/hr over 60 Minutes Intravenous Every 12 hours 06/07/15 1524 06/08/15 0609   06/07/15 1500  metroNIDAZOLE (FLAGYL) IVPB 500 mg  Status:  Discontinued     500 mg 100 mL/hr over 60 Minutes Intravenous Every 8 hours 06/07/15 1459 06/08/15 0609   06/07/15 1300  ciprofloxacin (CIPRO) IVPB 400 mg     400 mg 200 mL/hr over 60 Minutes Intravenous  Once 06/07/15 1258 06/07/15 1422   06/07/15 1300  metroNIDAZOLE (FLAGYL) IVPB 500 mg     500 mg 100 mL/hr over 60 Minutes Intravenous  Once 06/07/15 1258 06/07/15 1422        Assessment/Plan Ruptured diverticulitis with peritonitis POD#5 exploratory laparotomy,  sigmoid colectomy, takedown of splenic flexure, end colostomy---Dr. Dalbert Batman -stable, tolerating POs, afebrile and having ostomy function.  -oozing at the wound edges, will leave wet to dry on today.  Silver nitrate if bleeding continues.  Plan to resume VAC in AM. -ostomy teaching -mobilize -IS ID-Invanz D#6, 2 more days to total of 7 days post op.  Check CBC.   AF with RVR-on Amio Renal failure VTE prophylaxis-SCD/lovenox FEN-tolerating POs, add PO pain meds Dispo-to SNF once pain controlled with POs   Erby Pian, ANP-BC [facility] Pager [phone](7A-4:30P)   06/14/2015 9:07 AM

## 2015-06-14 NOTE — Progress Notes (Signed)
Completed wet to dry dressing change on patient. Attempted to use silver nitrate applicator to help with bleeding, but patient was unable to tolerate. Pt was in severe pain from dressing change. Administered 2mg  Dilaudid IV to help with pain. Will continue to monitor.  Mercy Moore, RN

## 2015-06-15 MED ORDER — FENTANYL CITRATE (PF) 100 MCG/2ML IJ SOLN
INTRAMUSCULAR | Status: AC
  Filled 2015-06-15: qty 2

## 2015-06-15 MED ORDER — PRO-STAT SUGAR FREE PO LIQD
30.0000 mL | Freq: Every day | ORAL | Status: DC
  Administered 2015-06-15 – 2015-06-17 (×2): 30 mL via ORAL
  Filled 2015-06-15 (×5): qty 30

## 2015-06-15 MED ORDER — BOOST / RESOURCE BREEZE PO LIQD
1.0000 | Freq: Two times a day (BID) | ORAL | Status: DC
  Administered 2015-06-15: 1 via ORAL

## 2015-06-15 MED ORDER — METHOCARBAMOL 750 MG PO TABS
750.0000 mg | ORAL_TABLET | Freq: Three times a day (TID) | ORAL | Status: DC
  Administered 2015-06-15 – 2015-06-18 (×10): 750 mg via ORAL
  Filled 2015-06-15 (×11): qty 1

## 2015-06-15 NOTE — Progress Notes (Signed)
[facility] Progress Note   Deanna Aguilar [id number] DOB: March 25, 1956 DOA: 06/07/2015 PCP: [name], MD  Brief narrative: Deanna Aguilar is a 59 y.o. female with hypertension who presents with abdominal pain for 2 days. She points to the suprapubic area and states that it radiates from there to the right and left upper abdomen into her back. She feels pain when she tries to urinate as well. Pain quite severe. She has had nausea but no vomiting. She has had no appetite. She's felt hot and cold but has not checked her temperature. He has been taking Aleve and BC powders help control pain.    Subjective: Pt has no new complaints. States she feels better. Had some emesis last night. Tolerating fluids this morning po.  Assessment/Plan: Principal Problem:  Diverticulitis of colon with perforation/leukocytosis/peritonitis - General surgery on board and managing: pt is s/p POD #6 exploratory laparotomy, sigmoid colectomy, takedown of splenic flexure, end colostomy---Dr. Dalbert Batman -Plan is to continue IV antibiotics for 1 more days to complete a total of 7 days post op - supportive therapy - Physical therapy  Atrial fibrillation with rvr - Cardiology on board and managing. Plan is to continue anticoagulation on discharge. Currently on oral amiodarone.  Active Problems:  Sepsis - Hypotension/ leukocytosis - Resolving on current antibiotics therapy. WBC trending down  Renal failure - improving after IV fluid infusion and continue IV antibiotic administration. -May also be related to NSAIDs and ACE inhibitor along with a prerenal state -continue to hold ACE inhibitor  Code Status: Full code Family Communication:  Disposition Plan: per sugery  DVT prophylaxis: Heparin Consultants:surgery  Procedures: POD#6 exploratory laparotomy, sigmoid colectomy, takedown of splenic flexure, end colostomy---Dr. Dalbert Batman  Antibiotics: Anti-infectives    Start     Dose/Rate Route  Frequency Ordered Stop   06/08/15 0615  ertapenem (INVANZ) 1 g in sodium chloride 0.9 % 50 mL IVPB     1 g 100 mL/hr over 30 Minutes Intravenous Daily 06/08/15 0609 06/17/15 0959   06/07/15 2359  ciprofloxacin (CIPRO) IVPB 400 mg  Status:  Discontinued     400 mg 200 mL/hr over 60 Minutes Intravenous Every 12 hours 06/07/15 1524 06/08/15 0609   06/07/15 1500  metroNIDAZOLE (FLAGYL) IVPB 500 mg  Status:  Discontinued     500 mg 100 mL/hr over 60 Minutes Intravenous Every 8 hours 06/07/15 1459 06/08/15 0609   06/07/15 1300  ciprofloxacin (CIPRO) IVPB 400 mg     400 mg 200 mL/hr over 60 Minutes Intravenous  Once 06/07/15 1258 06/07/15 1422   06/07/15 1300  metroNIDAZOLE (FLAGYL) IVPB 500 mg     500 mg 100 mL/hr over 60 Minutes Intravenous  Once 06/07/15 1258 06/07/15 1422      Objective: Filed Weights   06/07/15 1615 06/11/15 0600 06/12/15 0400  Weight: 121.1 kg (266 lb 15.6 oz) 131.4 kg (289 lb 11 oz) 133.9 kg (295 lb 3.1 oz)    Intake/Output Summary (Last 24 hours) at 06/15/15 1726 Last data filed at 06/15/15 1214  Gross per 24 hour  Intake   2610 ml  Output      0 ml  Net   2610 ml     Vitals Filed Vitals:   06/14/15 2219 06/15/15 0445 06/15/15 0955 06/15/15 1427  BP: 120/65 131/62 129/54 151/67  Pulse: 65 72 70 76  Temp: 98.6 F (37 C) 99 F (37.2 C)  99.7 F (37.6 C)  TempSrc: Oral Oral  Oral  Resp: 16 16  16  Height:      Weight:      SpO2: 98% 98%  98%    Exam:  General:  Pt is alert, in NAD  HEENT: No icterus, No thrush, oral mucosa moist  Cardiovascular: regular rate and rhythm, S1/S2 No murmur  Respiratory: clear to auscultation bilaterally , no wheezes  Abdomen: Soft, non distended, no guarding  MSK: No LE edema, cyanosis or clubbing  Data Reviewed: Basic Metabolic Panel:  Recent Labs Lab 06/09/15 0335 06/09/15 1403 06/09/15 2235 06/10/15 0349 06/11/15 0340 06/12/15 0417  NA 140 141 141 141 143 144  K 3.7 4.1 4.3 4.4 4.5 4.0  CL 113*  113* 114* 115* 117* 116*  CO2 19* 20* 19* 18* 19* 22  GLUCOSE 91 110* 137* 145* 117* 113*  BUN 27* 23* 25* 27* 33* 28*  CREATININE 1.40* 1.25* 1.25* 1.35* 1.32* 1.16*  CALCIUM 7.9* 7.8* 7.6* 7.7* 8.0* 8.3*  MG 1.8  --  1.9  --  2.6*  --    Liver Function Tests: No results for input(s): AST, ALT, ALKPHOS, BILITOT, PROT, ALBUMIN in the last 168 hours. No results for input(s): LIPASE, AMYLASE in the last 168 hours. No results for input(s): AMMONIA in the last 168 hours. CBC:  Recent Labs Lab 06/09/15 1403 06/10/15 0349 06/11/15 0043 06/12/15 0417 06/14/15 0924  WBC 14.9* 14.0* 13.9* 12.4* 11.5*  HGB 11.5* 10.6* 9.5* 9.9* 9.8*  HCT 34.7* 32.8* 28.7* 31.2* 29.7*  MCV 88.1 87.5 87.5 90.7 90.5  PLT 374 389 402* 489* 466*   Cardiac Enzymes:  Recent Labs Lab 06/09/15 0335  TROPONINI <0.03   BNP (last 3 results) No results for input(s): BNP in the last 8760 hours.  ProBNP (last 3 results) No results for input(s): PROBNP in the last 8760 hours.  CBG: No results for input(s): GLUCAP in the last 168 hours.  Recent Results (from the past 240 hour(s))  MRSA PCR Screening     Status: None   Collection Time: 06/07/15  4:13 PM  Result Value Ref Range Status   MRSA by PCR NEGATIVE NEGATIVE Final    Comment:        The GeneXpert MRSA Assay (FDA approved for NASAL specimens only), is one component of a comprehensive MRSA colonization surveillance program. It is not intended to diagnose MRSA infection nor to guide or monitor treatment for MRSA infections.      Studies: No results found.  Scheduled Meds:  Scheduled Meds: . acetaminophen  1,000 mg Oral TID  . amiodarone  200 mg Oral BID  . antiseptic oral rinse  7 mL Mouth Rinse BID  . ertapenem  1 g Intravenous Daily  . feeding supplement (PRO-STAT SUGAR FREE 64)  30 mL Oral Daily  . feeding supplement (RESOURCE BREEZE)  1 Container Oral BID BM  . lip balm  1 application Topical BID  . methocarbamol  750 mg Oral TID   . metoprolol  5 mg Intravenous Once  . metoprolol tartrate  25 mg Oral BID  . polyethylene glycol  17 g Oral Daily  . saccharomyces boulardii  250 mg Oral BID  . sodium chloride  3 mL Intravenous Q12H   Continuous Infusions: . sodium chloride 150 mL/hr at 06/15/15 1341    Time spent on care of this patient: 35 min   Velvet Bathe, MD 06/15/2015, 5:26 PM  LOS: 8 days   [facility] Office  [phone] Pager - Text Page per [email or web address] If 7PM-7AM, please contact night-coverage [email or web address]

## 2015-06-15 NOTE — Care Management Note (Signed)
Case Management Note  Patient Details  Name: Deanna Aguilar MRN: [id number] Date of Birth: 1955/12/23  Subjective/Objective:                    Action/Plan:   Expected Discharge Date:                  Expected Discharge Plan:  Skilled Nursing Facility  In-House Referral:  NA, Clinical Social Work  Discharge planning Services  CM Consult  Post Acute Care Choice:  NA Choice offered to:  NA  DME Arranged:  N/A DME Agency:  NA  HH Arranged:  NA HH Agency:  NA  Status of Service:  In process, will continue to follow  Medicare Important Message Given:    Date Medicare IM Given:    Medicare IM give by:    Date Additional Medicare IM Given:    Additional Medicare Important Message give by:     If discussed at [address] of Stay Meetings, dates discussed:  06/15/15  Additional Comments:  Dessa Phi, RN 06/15/2015, 1:17 PM

## 2015-06-15 NOTE — Progress Notes (Addendum)
ANTICOAGULATION CONSULT NOTE   Pharmacy Consult for Lovenox Indication: Afib  No Known Allergies  Patient Measurements: Height: 5\' 9"  (175.3 cm) Weight: 295 lb 3.1 oz (133.9 kg) IBW/kg (Calculated) : 66.2  Vital Signs: Temp: 99 F (37.2 C) (07/12 0445) Temp Source: Oral (07/12 0445) BP: 131/62 mmHg (07/12 0445) Pulse Rate: 72 (07/12 0445)  Labs:  Recent Labs  06/14/15 0924  HGB 9.8*  HCT 29.7*  PLT 466*    Estimated Creatinine Clearance: 78.8 mL/min (by C-G formula based on Cr of 1.16).    Assessment: Deanna Aguilar admitted 7/4 with abdominal pain x 2 days.  CT showed ruptured diverticulitis with peritonitis.  S/p exploratory laparotomy, sigmoid colectomy w/ colostomy, and takedown splenic flexure on 7/6.  Patient went into atrial fibrillation with RVR.  Pharmacy consulted to dose treatment Lovenox.  Cardiology consulted and pt also on amiodarone and metoprolol.  Today, 06/15/2015: Surgery documenting two areas of active bleeding to wound, holding Lovenox x 1 day with plans to reassess tomorrow.   CBC: Hgb low but stable. Platelets 466K. Renal: SCr 1.16 with CrCl ~ 78 ml/min  Goal of Therapy:  AntiXa Level 0.6-1 units/mL Monitor platelets by anticoagulation protocol: Yes   Plan:  1) Lovenox on hold by surgery for wound bleeding.  F/u surgical assessment of bleeding 7/13. 2) Cardiology note suggested continuing anticoagulation for at least 4 weeks post conversion with NOAC agent as a reasonable option.  If considering transitioning to a NOAC, would consider resuming anticoagulation with either Xarelto 20 mg daily or Apixaban 5 mg BID.   Hershal Coria, PharmD, BCPS Pager: [phone] 06/15/2015 9:04 AM

## 2015-06-15 NOTE — Progress Notes (Signed)
Patient ID: Deanna Aguilar, female   DOB: 08-26-1956, 59 y.o.   MRN: [id number]     [address] [place], [place] [id number]    Phone: [phone] FAX: [phone]     Subjective: Saturated 2 ABD pain.  2 areas of active bleeding to wound.  Crying.  Pain meds and anti-emetics given.   Pt hinders her own care. Not getting up much now.    Objective:  Vital signs:  Filed Vitals:   06/14/15 0607 06/14/15 1405 06/14/15 2219 06/15/15 0445  BP: 112/56 153/68 120/65 131/62  Pulse: 66 80 65 72  Temp: 98.6 F (37 C) 98.6 F (37 C) 98.6 F (37 C) 99 F (37.2 C)  TempSrc: Oral Oral Oral Oral  Resp: 12 14 16 16   Height:      Weight:      SpO2: 100% 98% 98% 98%    Last BM Date: 06/15/15  Intake/Output   Yesterday:  07/11 0701 - 07/12 0700 In: 3150 [P.O.:120; I.V.:2980; IV Piggyback:50] Out: -  This shift:    I/O last 3 completed shifts: In: 3150 [P.O.:120; I.V.:2980; IV Piggyback:50] Out: 150 [Stool:150]    Physical Exam: General: Pt awake/alert/oriented x4 in no acute distress Abdomen: Soft. Nondistended. Mildly tender at incisions only. Midline wound is beefy red, fascia is intact and clean. moderate bleeding noted.  Right wound edges sutures and left wound base vessel sutures.  Hemostasis achieved. Wet to dry dressing applied. No evidence of peritonitis. No incarcerated hernias. Ostomy is functioning.     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   Atrial fibrillation with RVR   Depression, acute   Colostomy in place    Results:   Labs: Results for orders placed or performed during the hospital encounter of 06/07/15 (from the past 48 hour(s))  CBC     Status: Abnormal   Collection  Time: 06/14/15  9:24 AM  Result Value Ref Range   WBC 11.5 (H) 4.0 - 10.5 K/uL   RBC 3.28 (L) 3.87 - 5.11 MIL/uL   Hemoglobin 9.8 (L) 12.0 - 15.0 g/dL   HCT 29.7 (L) 36.0 - 46.0 %   MCV 90.5 78.0 - 100.0 fL   MCH 29.9 26.0 - 34.0 pg   MCHC 33.0 30.0 - 36.0 g/dL   RDW 15.2 11.5 - 15.5 %   Platelets 466 (H) 150 - 400 K/uL    Imaging / Studies: No results found.  Medications / Allergies:  Scheduled Meds: . acetaminophen  1,000 mg Oral TID  . amiodarone  200 mg Oral BID  . antiseptic oral rinse  7 mL Mouth Rinse BID  . ertapenem  1 g Intravenous Daily  . fentaNYL      . lip balm  1 application Topical BID  . methocarbamol  750 mg Oral TID  . metoprolol  5 mg Intravenous Once  . metoprolol tartrate  25 mg Oral BID  . polyethylene glycol  17 g Oral Daily  . saccharomyces boulardii  250 mg Oral BID  . sodium chloride  3 mL Intravenous Q12H   Continuous Infusions: . sodium chloride 150 mL/hr at 06/15/15 0527   PRN Meds:.sodium chloride, alum & mag hydroxide-simeth, diphenhydrAMINE, HYDROmorphone (DILAUDID) injection,  lactated ringers, magic mouthwash, menthol-cetylpyridinium, metoCLOPramide (REGLAN) injection, metoCLOPramide, ondansetron **OR** ondansetron (ZOFRAN) IV, oxyCODONE-acetaminophen, phenol, promethazine, sodium chloride  Antibiotics: Anti-infectives    Start     Dose/Rate Route Frequency Ordered Stop   06/08/15 0615  ertapenem (INVANZ) 1 g in sodium chloride 0.9 % 50 mL IVPB     1 g 100 mL/hr over 30 Minutes Intravenous Daily 06/08/15 0609 06/17/15 0959   06/07/15 2359  ciprofloxacin (CIPRO) IVPB 400 mg  Status:  Discontinued     400 mg 200 mL/hr over 60 Minutes Intravenous Every 12 hours 06/07/15 1524 06/08/15 0609   06/07/15 1500  metroNIDAZOLE (FLAGYL) IVPB 500 mg  Status:  Discontinued     500 mg 100 mL/hr over 60 Minutes Intravenous Every 8 hours 06/07/15 1459 06/08/15 0609   06/07/15 1300  ciprofloxacin (CIPRO) IVPB 400 mg     400 mg 200 mL/hr over 60  Minutes Intravenous  Once 06/07/15 1258 06/07/15 1422   06/07/15 1300  metroNIDAZOLE (FLAGYL) IVPB 500 mg     500 mg 100 mL/hr over 60 Minutes Intravenous  Once 06/07/15 1258 06/07/15 1422       Assessment/Plan Ruptured diverticulitis with peritonitis POD#6 exploratory laparotomy, sigmoid colectomy, takedown of splenic flexure, end colostomy---Dr. Dalbert Batman -stable, tolerating POs, afebrile and having ostomy function.  -continues to ooze, sutured x2 and now bleeding is controlled, but high risk of rebleeding.  Will hold her lovenox for 24 hours and reassess tomorrow.  -plan to resume VAC when not bleeding.  -ostomy teaching -needs to mobilize and sit up in chair!! -IS ID-Invanz D#7, 1 more day for a total of 7 days post op. AF with RVR-on Amio Renal failure VTE prophylaxis-SCD.  Needs to be on lovenox for AF, but bleeding too much.  Will assess tomorrow.   FEN-tolerating POs, add PO pain meds Dispo-to SNF once pain controlled with POs  Erby Pian, ANP-BC [facility] Pager [phone](7A-4:30P)   06/15/2015 9:21 AM

## 2015-06-15 NOTE — Care Management Note (Signed)
Case Management Note  Patient Details  Name: Deanna Aguilar MRN: [id number] Date of Birth: 03/14/1956  Subjective/Objective:  PT-SNF. Iv abx, pain control, w-d dsg.Currently wound vac on hold until bleeding controlled.                  Action/Plan:d/c plan SNF.   Expected Discharge Date:                  Expected Discharge Plan:  Skilled Nursing Facility  In-House Referral:  NA, Clinical Social Work  Discharge planning Services  CM Consult  Post Acute Care Choice:  NA Choice offered to:  NA  DME Arranged:  N/A DME Agency:  NA  HH Arranged:  NA HH Agency:  NA  Status of Service:  In process, will continue to follow  Medicare Important Message Given:    Date Medicare IM Given:    Medicare IM give by:    Date Additional Medicare IM Given:    Additional Medicare Important Message give by:     If discussed at [place] of Stay Meetings, dates discussed:    Additional Comments:  Dessa Phi, RN 06/15/2015, 1:15 PM

## 2015-06-15 NOTE — Progress Notes (Signed)
Physical Therapy Treatment Patient Details Name: Deanna Aguilar MRN: [id number] DOB: [birth year] Today's Date: 06/15/2015    History of Present Illness 59 y.o. female admitted with abdominal pain. Dx of diverticulitits,  s/p colectomy 06/09/15.     PT Comments    Assisted pt to edge of bed. She reported increased pain of 10/10 and dizziness, stated she couldn't tolerate sitting, so assisted back to supine. Discussed importance of mobility, she verbalized understanding. Pt stated she's not able to think about a DC plan right now, she's "just trying to get by". Pt seems overwhelmed with her situation, RN aware and stated she'd seek psych consult and encourage mobility.   Follow Up Recommendations  SNF (pt reluctant to discuss DC plan, stated she can't think about that, she's just trying to get by right now)     Equipment Recommendations  Rolling walker with 5" wheels    Recommendations for Other Services       Precautions / Restrictions Precautions Precautions: Fall Precaution Comments: ostomy, wound vac, abdominal incision Restrictions Weight Bearing Restrictions: No    Mobility  Bed Mobility Overal bed mobility: Needs Assistance Bed Mobility: Sidelying to Sit;Rolling Rolling: Min assist Sidelying to sit: Mod assist Supine to sit: Mod assist     General bed mobility comments: instructed pt in log roll, verbal cues for technique, mod A to raise trunk to sit and for BLEs back into bed. Pt reported dizziness in sitting, increased pain with mobility, stated she could not get OOB. Drainage from abdominal wound noted, RN notified. Returned pt to supine.   Transfers                    Ambulation/Gait             General Gait Details: distance limited by fatigue   Stairs            Wheelchair Mobility    Modified Rankin (Stroke Patients Only)       Balance     Sitting balance-Leahy Scale: Good                              Cognition  Arousal/Alertness: Awake/alert Behavior During Therapy: WFL for tasks assessed/performed;Anxious Overall Cognitive Status: Within Functional Limits for tasks assessed (pt stated she can't think about a DC plan, she's just trying to get by right now)                      Exercises      General Comments        Pertinent Vitals/Pain Pain Score: 10-Worst pain ever Pain Location: abdominal incision at rest Pain Descriptors / Indicators: Sore Pain Intervention(s): Limited activity within patient's tolerance;Monitored during session;Premedicated before session;Patient requesting pain meds-RN notified;Repositioned    Home Living                      Prior Function            PT Goals (current goals can now be found in the care plan section) Acute Rehab PT Goals Patient Stated Goal: to get stronger PT Goal Formulation: With patient Time For Goal Achievement: 06/27/15 Potential to Achieve Goals: Good Progress towards PT goals: Not progressing toward goals - comment (pain, wound drainage limiting progress)    Frequency  Min 3X/week    PT Plan Current plan remains appropriate    Co-evaluation  End of Session Equipment Utilized During Treatment: Gait belt Activity Tolerance: Patient limited by pain Patient left: in bed;with nursing/sitter in room     Time: 1142-1154 PT Time Calculation (min) (ACUTE ONLY): 12 min  Charges:  $Therapeutic Activity: 8-22 mins                    G Codes:      Philomena Doheny 06/15/2015, 11:59 AM [phone]

## 2015-06-15 NOTE — Progress Notes (Signed)
Initial Nutrition Assessment  DOCUMENTATION CODES:  Obesity unspecified  INTERVENTION: - Will order Resource Breeze BID, each supplement provides 250 kcal and 9 grams of protein - Will order Prostat once/day, each supplement provides 100 kcal and 15 grams of protein - RD will continue to monitor for needs  NUTRITION DIAGNOSIS:  Inadequate oral intake related to nausea as evidenced by per patient/family report, meal completion < 25%.  GOAL:  Patient will meet greater than or equal to 90% of their needs  MONITOR:  PO intake, Weight trends, Labs, I & O's  REASON FOR ASSESSMENT:  Malnutrition Screening Tool  ASSESSMENT: 59 y.o. female with hypertension who presents with abdominal pain for 2 days. She points to the suprapubic area and states that it radiates from there to the right and left upper abdomen into her back. She feels pain when she tries to urinate as well. Pain quite severe. She has had nausea but no vomiting. She has had no appetite.  Pt seen for MST. BMI indicates obesity. Pt with 20% intake of breakfast this AM and 15% of dinner yesterday. Pt reports poor appetite with associated abdominal pain and nausea that began PTA. She is now POD #6 ex lap, sigmoid colectomy, takedown of splenic flexure, end colostomy.  She indicates unknown UBW and no weight changes PTA. Per weight hx review, pt gained 29 lbs from 7/4-7/9; question accuracy of this with poor intakes; may be fluid related.  Pt not meeting needs. Will order supplements and assess for acceptance. No muscle or fat wasting noted at this time. Medications reviewed. Labs reviewed; Cl: 116 mmol/L, BUN/creatinine elevated, Ca: 8.3 mg/dL, Mg: 2.6 mg/dL, GFR: 59.   Diet Order:  Diet Heart Room service appropriate?: Yes; Fluid consistency:: Thin  Skin:  Wound (see comment) (abdominal surgical wound)  Last BM:  7/10  Height:  Ht Readings from Last 1 Encounters:  06/07/15 5\' 9"  (1.753 m)    Weight:  Wt Readings  from Last 1 Encounters:  06/12/15 295 lb 3.1 oz (133.9 kg)    Ideal Body Weight:  65.9 kg (kg)  Wt Readings from Last 10 Encounters:  06/12/15 295 lb 3.1 oz (133.9 kg)    BMI:  Body mass index is 43.57 kg/(m^2).  Estimated Nutritional Needs:  Kcal:  2010-2210  Protein:  110-120 grams  Fluid:  2.2 L/day  EDUCATION NEEDS:  No education needs identified at this time     Jarome Matin, RD, LDN Inpatient Clinical Dietitian Pager # [phone] After hours/weekend pager # [phone]

## 2015-06-16 DIAGNOSIS — N289 Disorder of kidney and ureter, unspecified: Secondary | ICD-10-CM

## 2015-06-16 DIAGNOSIS — D72829 Elevated white blood cell count, unspecified: Secondary | ICD-10-CM

## 2015-06-16 DIAGNOSIS — Z933 Colostomy status: Secondary | ICD-10-CM

## 2015-06-16 DIAGNOSIS — F329 Major depressive disorder, single episode, unspecified: Secondary | ICD-10-CM

## 2015-06-16 DIAGNOSIS — K659 Peritonitis, unspecified: Secondary | ICD-10-CM

## 2015-06-16 LAB — BASIC METABOLIC PANEL
Anion gap: 5 (ref 5–15)
BUN: 10 mg/dL (ref 6–20)
CHLORIDE: 115 mmol/L — AB (ref 101–111)
CO2: 22 mmol/L (ref 22–32)
Calcium: 7.6 mg/dL — ABNORMAL LOW (ref 8.9–10.3)
Creatinine, Ser: 0.75 mg/dL (ref 0.44–1.00)
GFR calc non Af Amer: >60 mL/min (ref >60)
Glucose, Bld: 93 mg/dL (ref 65–99)
Potassium: 3.5 mmol/L (ref 3.5–5.1)
Sodium: 142 mmol/L (ref 135–145)

## 2015-06-16 LAB — CBC
HCT: 26.6 % — ABNORMAL LOW (ref 36.0–46.0)
Hemoglobin: 8.6 g/dL — ABNORMAL LOW (ref 12.0–15.0)
MCH: 29.4 pg (ref 26.0–34.0)
MCHC: 32.3 g/dL (ref 30.0–36.0)
MCV: 90.8 fL (ref 78.0–100.0)
Platelets: 409 10*3/uL — ABNORMAL HIGH (ref 150–400)
RBC: 2.93 MIL/uL — ABNORMAL LOW (ref 3.87–5.11)
RDW: 15.3 % (ref 11.5–15.5)
WBC: 11.5 10*3/uL — ABNORMAL HIGH (ref 4.0–10.5)

## 2015-06-16 MED ORDER — ENOXAPARIN SODIUM 150 MG/ML ~~LOC~~ SOLN
1.0000 mg/kg | Freq: Two times a day (BID) | SUBCUTANEOUS | Status: DC
  Administered 2015-06-16 – 2015-06-17 (×2): 130 mg via SUBCUTANEOUS
  Filled 2015-06-16 (×2): qty 1

## 2015-06-16 MED ORDER — OXYCODONE HCL 5 MG PO TABS
15.0000 mg | ORAL_TABLET | ORAL | Status: DC | PRN
Start: 2015-06-16 — End: 2015-06-18
  Administered 2015-06-16 (×2): 20 mg via ORAL
  Administered 2015-06-16: 15 mg via ORAL
  Administered 2015-06-17 (×2): 20 mg via ORAL
  Administered 2015-06-18: 15 mg via ORAL
  Administered 2015-06-18: 20 mg via ORAL
  Filled 2015-06-16 (×2): qty 4
  Filled 2015-06-16 (×2): qty 3
  Filled 2015-06-16 (×3): qty 4

## 2015-06-16 NOTE — Clinical Social Work Placement (Signed)
CSW reviewed PT evaluation recommending SNF at discharge. CSW spoke with patient who states that she would prefer to talk it over with her friend before making a decision. CSW called around to [facility], due to [facility] & wound vac - the closest facility that would be willing to take patient would be [facility] in [place] or [facility] in [place]. CSW provided information to patient who states that neither of those would be options for her and that she will likely go home with home health at discharge. Patient states she will call CSW once decision has been made re: discharge disposition.   CLINICAL SOCIAL WORK PLACEMENT  NOTE  Date:  06/16/2015  Patient Details  Name: Deanna Aguilar MRN: [id number] Date of Birth: [birth year]  Clinical Social Work is seeking post-discharge placement for this patient at the [place] level of care (*CSW will initial, date and re-position this form in  chart as items are completed):  Yes   Patient/family provided with [facility] Work Department's list of facilities offering this level of care within the geographic area requested by the patient (or if unable, by the patient's family).  Yes   Patient/family informed of their freedom to choose among providers that offer the needed level of care, that participate in Medicare, Medicaid or managed care program needed by the patient, have an available bed and are willing to accept the patient.  Yes   Patient/family informed of [facility]'s ownership interest in [facility] and [facility], as well as of the fact that they are under no obligation to receive care at these facilities.  PASRR submitted to EDS on 06/16/15     PASRR number received on 06/16/15     Existing PASRR number confirmed on       FL2 transmitted to all facilities in geographic area requested by pt/family on 06/16/15     FL2 transmitted to all facilities within larger geographic area on  06/16/15     Patient informed that his/her managed care company has contracts with or will negotiate with certain facilities, including the following:        Yes   Patient/family informed of bed offers received.  Patient chooses bed at       Physician recommends and patient chooses bed at      Patient to be transferred to   on  .  Patient to be transferred to facility by       Patient family notified on   of transfer.  Name of family member notified:        PHYSICIAN       Additional Comment:    _______________________________________________ Standley Brooking, LCSW 06/16/2015, 2:54 PM

## 2015-06-16 NOTE — Progress Notes (Signed)
Resumed care of patient. Agree with previous assessment.  Mercy Moore, RN

## 2015-06-16 NOTE — Progress Notes (Signed)
ANTICOAGULATION CONSULT NOTE   Pharmacy Consult for Lovenox Indication: Afib  No Known Allergies  Patient Measurements: Height: 5\' 9"  (175.3 cm) Weight: 295 lb 3.1 oz (133.9 kg) IBW/kg (Calculated) : 66.2  Vital Signs: Temp: 98.6 F (37 C) (07/13 0535) Temp Source: Oral (07/13 0535) BP: 129/66 mmHg (07/13 0535) Pulse Rate: 63 (07/13 0535)  Labs:  Recent Labs  06/14/15 0924 06/16/15 0815  HGB 9.8* 8.6*  HCT 29.7* 26.6*  PLT 466* 409*  CREATININE  --  0.75    Estimated Creatinine Clearance: 114.3 mL/min (by C-G formula based on Cr of 0.75).    Assessment: 70 yoF admitted 7/4 with abdominal pain x 2 days.  CT showed ruptured diverticulitis with peritonitis.  S/p exploratory laparotomy, sigmoid colectomy w/ colostomy, and takedown splenic flexure on 7/6.  Patient went into atrial fibrillation with RVR.  Pharmacy consulted to dose treatment Lovenox.  Cardiology consulted and pt also on amiodarone and metoprolol.  Significant events: 7/12 Surgery documented two areas of active bleeding to wound, held Lovenox x 1 day 7/13 Surgery notes bleeding has stopped. Recommends resuming Lovenox tonight  Today, 06/16/2015: CBC: Hgb low, trended down to 8.6 today. Platelets 409K. Renal: SCr improved to 0.75 with CrCl > 100 ml/min  Goal of Therapy:  AntiXa Level 0.6-1 units/mL Monitor platelets by anticoagulation protocol: Yes   Plan:  1) Resume Lovenox 130 mg (1 mg/kg) SQ q12h this evening.   2) Cardiology note suggested continuing anticoagulation for at least 4 weeks post conversion with NOAC agent as a reasonable option.  If considering transitioning to a NOAC, would consider either Xarelto 20 mg daily or Apixaban 5 mg BID.   Hershal Coria, PharmD, BCPS Pager: [phone] 06/16/2015 1:02 PM

## 2015-06-16 NOTE — Progress Notes (Signed)
PT Cancellation Note  Patient Details Name: Deanna Aguilar MRN: [id number] DOB: 1956/10/19   Cancelled Treatment:    Reason Eval/Treat Not Completed: Patient declined, no reason specified. Attempted PT tx session. Pt declined ambulation with therapy on today. Pt up to chair with nursing-states that's all she can handle on today. Will check back another day and will attempt to progress activity as pt will allow.   Weston Anna, MPT Pager: [phone]

## 2015-06-16 NOTE — Progress Notes (Signed)
Patient ID: Deanna Aguilar, female   DOB: [birth year], 59 y.o.   MRN: [id number]     [address] [place], [place] [id number]    Phone: [phone] FAX: [phone]     Subjective: Nauseated, but hungry.  Doesn't like any thing on the menu and therefore not eating.  Did not get OOB at all yesterday.  Objective:  Vital signs:  Filed Vitals:   06/15/15 0955 06/15/15 1427 06/15/15 2111 06/16/15 0535  BP: 129/54 151/67 122/59 129/66  Pulse: 70 76 67 63  Temp:  99.7 F (37.6 C) 98.3 F (36.8 C) 98.6 F (37 C)  TempSrc:  Oral Oral Oral  Resp:  16 18 18   Height:      Weight:      SpO2:  98% 98% 98%    Last BM Date: 06/15/15  Intake/Output   Yesterday:  07/12 0701 - 07/13 0700 In: 2195 [P.O.:480; I.V.:1665; IV Piggyback:50] Out: 300 [Stool:300] This shift:     Physical Exam: General: Pt awake/alert/oriented x4 in no acute distress Abdomen: Soft. Nondistended. Mildly tender at incisions only. Midline wound is beefy red, fascia is intact and clean. moderate bleeding noted.no bleeding. Wet to dry dressing applied. No evidence of peritonitis. No incarcerated hernias. Ostomy is functioning.    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   Atrial fibrillation with RVR   Depression, acute   Colostomy in place    Results:   Labs: Results for orders placed or performed during the hospital encounter of 06/07/15 (from the past 48 hour(s))  CBC     Status: Abnormal   Collection Time: 06/14/15  9:24 AM  Result Value Ref Range   WBC 11.5 (H) 4.0 - 10.5 K/uL   RBC 3.28 (L) 3.87 - 5.11 MIL/uL   Hemoglobin 9.8 (L) 12.0 - 15.0 g/dL   HCT 29.7 (L) 36.0 - 46.0 %   MCV 90.5 78.0 - 100.0 fL   MCH 29.9  26.0 - 34.0 pg   MCHC 33.0 30.0 - 36.0 g/dL   RDW 15.2 11.5 - 15.5 %   Platelets 466 (H) 150 - 400 K/uL    Imaging / Studies: No results found.  Medications / Allergies:  Scheduled Meds: . acetaminophen  1,000 mg Oral TID  . amiodarone  200 mg Oral BID  . antiseptic oral rinse  7 mL Mouth Rinse BID  . ertapenem  1 g Intravenous Daily  . feeding supplement (PRO-STAT SUGAR FREE 64)  30 mL Oral Daily  . feeding supplement (RESOURCE BREEZE)  1 Container Oral BID BM  . lip balm  1 application Topical BID  . methocarbamol  750 mg Oral TID  . metoprolol  5 mg Intravenous Once  . metoprolol tartrate  25 mg Oral BID  . polyethylene glycol  17 g Oral Daily  . saccharomyces boulardii  250 mg Oral BID  . sodium chloride  3 mL Intravenous Q12H   Continuous Infusions: . sodium chloride 150 mL/hr at 06/16/15 0255   PRN Meds:.sodium chloride, alum & mag hydroxide-simeth, diphenhydrAMINE, HYDROmorphone (DILAUDID) injection, lactated ringers, magic mouthwash, menthol-cetylpyridinium, metoCLOPramide (REGLAN) injection, metoCLOPramide, ondansetron **OR** ondansetron (ZOFRAN) IV, oxyCODONE-acetaminophen, phenol, promethazine, sodium chloride  Antibiotics: Anti-infectives    Start  Dose/Rate Route Frequency Ordered Stop   06/08/15 0615  ertapenem (INVANZ) 1 g in sodium chloride 0.9 % 50 mL IVPB     1 g 100 mL/hr over 30 Minutes Intravenous Daily 06/08/15 0609 06/17/15 0959   06/07/15 2359  ciprofloxacin (CIPRO) IVPB 400 mg  Status:  Discontinued     400 mg 200 mL/hr over 60 Minutes Intravenous Every 12 hours 06/07/15 1524 06/08/15 0609   06/07/15 1500  metroNIDAZOLE (FLAGYL) IVPB 500 mg  Status:  Discontinued     500 mg 100 mL/hr over 60 Minutes Intravenous Every 8 hours 06/07/15 1459 06/08/15 0609   06/07/15 1300  ciprofloxacin (CIPRO) IVPB 400 mg     400 mg 200 mL/hr over 60 Minutes Intravenous  Once 06/07/15 1258 06/07/15 1422   06/07/15 1300  metroNIDAZOLE (FLAGYL) IVPB 500 mg     500  mg 100 mL/hr over 60 Minutes Intravenous  Once 06/07/15 1258 06/07/15 1422        Assessment/Plan Ruptured diverticulitis with peritonitis POD#7 exploratory laparotomy, sigmoid colectomy, takedown of splenic flexure, end colostomy---Dr. Dalbert Batman -stable, tolerating POs, afebrile and having ostomy function.   -ostomy teaching -needs to mobilize and sit up in chair!! -IS -bleeding has stopped.  resume VAC today and will ask pharm to resume her lovenox.  Monitor closely for bleeding from wound.   ID-Invanz D#8, total of 7 days post op, stop. AF with RVR-on Amio Renal failure VTE prophylaxis-SCD.plan to resume lovenox tonight  FEN-tolerating POs, increase PO pain meds scale, c/w scheduled tylenol and robaxin.  Dispo-to SNF once pain controlled with POs   Erby Pian, [facility] Surgery Pager [phone](7A-4:30P)   06/16/2015 7:56 AM

## 2015-06-16 NOTE — Clinical Social Work Psych Assess (Signed)
Clinical Social Work Nature conservation officer  Clinical Social Worker:  [place] Master, [place] Date/Time:  06/16/2015, 2:30 PM Referred By:  Physician Date Referred:  06/16/15 Reason for Referral:  Behavioral Health Issues   Presenting Symptoms/Problems  Presenting Symptoms/Problems(in person's/family's own words):  Psych consulted due to depression.   Abuse/Neglect/Trauma History  Abuse/Neglect/Trauma History:  Denies History Abuse/Neglect/Trauma History Comments (indicate dates):  N/A   Psychiatric History  Psychiatric History:  Denies History Psychiatric Medication:  None   Current Mental Health Hospitalizations/Previous Mental Health History:  Patient reports symptoms of depression but denies any formal diagnosis or previous history.   Current Provider:  None Place and Date:  N/A  Current Medications:    Scheduled Meds: . acetaminophen  1,000 mg Oral TID  . amiodarone  200 mg Oral BID  . antiseptic oral rinse  7 mL Mouth Rinse BID  . enoxaparin (LOVENOX) injection  1 mg/kg Subcutaneous Q12H  . feeding supplement (PRO-STAT SUGAR FREE 64)  30 mL Oral Daily  . feeding supplement  1 Container Oral BID BM  . lip balm  1 application Topical BID  . methocarbamol  750 mg Oral TID  . metoprolol  5 mg Intravenous Once  . metoprolol tartrate  25 mg Oral BID  . polyethylene glycol  17 g Oral Daily  . saccharomyces boulardii  250 mg Oral BID  . sodium chloride  3 mL Intravenous Q12H   Continuous Infusions: . sodium chloride 150 mL/hr at 06/16/15 0957   PRN Meds:.sodium chloride, alum & mag hydroxide-simeth, diphenhydrAMINE, HYDROmorphone (DILAUDID) injection, magic mouthwash, menthol-cetylpyridinium, metoCLOPramide (REGLAN) injection, metoCLOPramide, ondansetron **OR** ondansetron (ZOFRAN) IV, oxyCODONE, phenol, promethazine, sodium chloride     Previous Inpatient Admission/Date/Reason:  None reported   Emotional Health/Current Symptoms  Suicide/Self Harm:  None Reported Suicide Attempt in Past (date/description):  None reported  Other Harmful Behavior (ex. homicidal ideation) (describe):  None reported   Psychotic/Dissociative Symptoms  Psychotic/Dissociative Symptoms: None Reported Other Psychotic/Dissociative Symptoms:  N/A   Attention/Behavioral Symptoms  Attention/Behavioral Symptoms: Withdrawn Other Attention/Behavioral Symptoms:  Patient withdrawn and reports she does not want assistance from psych team.   Cognitive Impairment  Cognitive Impairment:  Within Normal Limits Other Cognitive Impairment:  Patient alert and oriented.   Mood and Adjustment  Mood and Adjustment:  Guarded   Stress, Anxiety, Trauma, Any Recent Loss/Stressor  Stress, Anxiety, Trauma, Any Recent Loss/Stressor: None Reported Anxiety (frequency):  N/A  Phobia (specify):  N/A  Compulsive Behavior (specify):  N/A  Obsessive Behavior (specify):  N/A  Other Stress, Anxiety, Trauma, Any Recent Loss/Stressor:  N/A   Substance Abuse/Use  Substance Abuse/Use: None SBIRT Completed (please refer for detailed history): No Self-reported Substance Use (last use and frequency):  Patient denies any substance use.  Urinary Drug Screen Completed: No Alcohol Level:  N/A   Environment/Housing/Living Arrangement  Environmental/Housing/Living Arrangement: Stable Housing Who is in the Home:  Alone  Emergency Contact:  Technical sales engineer  Financial: Medicaid   Patient's Strengths and Goals  Patient's Strengths and Goals (patient's own words):  Patient reports she is coping on her own and does not need any assistance.   Clinical Social Worker's Interpretive Summary  Clinical Social Workers Interpretive Summary:    CSW received referral in order to complete psychosocial assessment. CSW reviewed chart and met with patient at bedside with psych MD.  Patient reports she lives home alone and has always had stressors in her life. Patient  does report symptoms of depression such as low  energy, constant worrying, decreased appetite and feeling depressed majority of the days. Patient denies any previous treatment or diagnosis and reports that she wants to cope with her feelings in her own way. Psych team offered medication or counseling but patient is not interested. Patient reports that she does not need any assistance.  CSW is signing off but available if further needs arise or if patient changes her mind re: wanting assistance at DC.   Disposition  Disposition: Psych Clinical Social Worker [address]    [place], [place]

## 2015-06-16 NOTE — Progress Notes (Signed)
[facility]                                                                              Patient Demographics  Deanna Aguilar, is a 59 y.o. female, DOB - November 26, 1956, [id number]  Admit date - 06/07/2015   Admitting Physician [name], MD  Outpatient Primary MD for the patient is [name], MD  LOS - 9   Chief Complaint  Patient presents with  . Back Pain  . Abdominal Pain      HPI on 06/07/2015 by Dr. [name] CANDA Deanna Aguilar is a 59 y.o. female with hypertension who presents with abdominal pain for 2 days. She points to the suprapubic area and states that it radiates from there to the right and left upper abdomen into her back. She feels pain when she tries to urinate as well. Pain quite severe. She has had nausea but no vomiting. She has had no appetite. She's felt hot and cold but has not checked her temperature. He has been taking Aleve and BC powders help control pain. Dilaudid given in the ER has helped her pain.  Assessment & Plan   Sepsis secondary to ruptured diverticulitis with peritonitis -Patient did have hypotension as well as leukocytosis which have both improved -S/p exploratory laparotomy, sigmoid colectomy, takedown flexure and end colostomy -Currently tolerating diet -Ostomy appears to be functioning well -Encouraged patient to get out of bed and mobilize -VAC to be resumed today with resumption of Lovenox (patient has had issues with bleeding) -Patient received 8 days of Invanz, discontinued today for surgery -PT consulted  Atrial fibrillation with RVR -Currently on amiodarone, metoprolol -Cardiology consulted and appreciated -Patient will be restarted on Lovenox with possible transition to NOAC  Acute kidney failure -Resolved -Patient was taking NSAIDs as well as ACE inhibitor -Creatinine currently 0.75  Depression -Possibly situational, will consult psychiatry -Chaplain has been consulted  Acute blood loss Anemia -Baseline  hemoglobin appears to be 9, currently a 8.6 -Will continue to monitor closely as an Lovenox will be restarted today.  Code Status: Full   Family Communication: None at bedside   Disposition Plan: Admitted   Time Spent in minutes   30 minutes  Procedures  Exploratory laparotomy, sigmoid colectomy, takedown flexure and end colostomy  Consults   Cardiology General surgery   DVT Prophylaxis  Lovenox  Lab Results  Component Value Date   PLT 409* 06/16/2015    Medications  Scheduled Meds: . acetaminophen  1,000 mg Oral TID  . amiodarone  200 mg Oral BID  . antiseptic oral rinse  7 mL Mouth Rinse BID  . feeding supplement (PRO-STAT SUGAR FREE 64)  30 mL Oral Daily  . feeding supplement  1 Container Oral BID BM  . lip balm  1 application Topical BID  . methocarbamol  750 mg Oral TID  . metoprolol  5 mg Intravenous Once  . metoprolol tartrate  25 mg Oral BID  . polyethylene glycol  17 g Oral Daily  . saccharomyces boulardii  250 mg Oral BID  . sodium chloride  3 mL Intravenous Q12H   Continuous Infusions: . sodium chloride 150 mL/hr at 06/16/15 0957   PRN  Meds:.sodium chloride, alum & mag hydroxide-simeth, diphenhydrAMINE, HYDROmorphone (DILAUDID) injection, magic mouthwash, menthol-cetylpyridinium, metoCLOPramide (REGLAN) injection, metoCLOPramide, ondansetron **OR** ondansetron (ZOFRAN) IV, oxyCODONE, phenol, promethazine, sodium chloride  Antibiotics    Anti-infectives    Start     Dose/Rate Route Frequency Ordered Stop   06/08/15 0615  ertapenem (INVANZ) 1 g in sodium chloride 0.9 % 50 mL IVPB     1 g 100 mL/hr over 30 Minutes Intravenous Daily 06/08/15 0609 06/16/15 1017   06/07/15 2359  ciprofloxacin (CIPRO) IVPB 400 mg  Status:  Discontinued     400 mg 200 mL/hr over 60 Minutes Intravenous Every 12 hours 06/07/15 1524 06/08/15 0609   06/07/15 1500  metroNIDAZOLE (FLAGYL) IVPB 500 mg  Status:  Discontinued     500 mg 100 mL/hr over 60 Minutes Intravenous Every 8  hours 06/07/15 1459 06/08/15 0609   06/07/15 1300  ciprofloxacin (CIPRO) IVPB 400 mg     400 mg 200 mL/hr over 60 Minutes Intravenous  Once 06/07/15 1258 06/07/15 1422   06/07/15 1300  metroNIDAZOLE (FLAGYL) IVPB 500 mg     500 mg 100 mL/hr over 60 Minutes Intravenous  Once 06/07/15 1258 06/07/15 1422      Subjective:   Jon Gills seen and examined today. Patient very tearful this morning. States she is nauseated and is not hungry. Patient states she understands she needs to get out of bed however does not feel motivated. Patient states she is actually depressed. Denies any chest pain, shortness of breath, headache or dizziness.   Objective:   Filed Vitals:   06/15/15 0955 06/15/15 1427 06/15/15 2111 06/16/15 0535  BP: 129/54 151/67 122/59 129/66  Pulse: 70 76 67 63  Temp:  99.7 F (37.6 C) 98.3 F (36.8 C) 98.6 F (37 C)  TempSrc:  Oral Oral Oral  Resp:  16 18 18   Height:      Weight:      SpO2:  98% 98% 98%    Wt Readings from Last 3 Encounters:  06/12/15 133.9 kg (295 lb 3.1 oz)     Intake/Output Summary (Last 24 hours) at 06/16/15 1240 Last data filed at 06/16/15 0900  Gross per 24 hour  Intake   2075 ml  Output    300 ml  Net   1775 ml    Exam  General: Well developed, well nourished, no distress  HEENT: NCAT, mucous membranes moist.   Cardiovascular: S1 S2 auscultated, no rubs, murmurs or gallops. Regular rate and rhythm.  Respiratory: Clear to auscultation bilaterally with equal chest rise  Abdomen: Soft, obese, incisional tenderness, nondistended, + bowel sounds, bandages clean, ostomy   Extremities: warm dry without cyanosis clubbing or edema  Neuro: AAOx3,  nonfocal  Psych: Depressed mood and affect, tearful  Data Review   Micro Results Recent Results (from the past 240 hour(s))  MRSA PCR Screening     Status: None   Collection Time: 06/07/15  4:13 PM  Result Value Ref Range Status   MRSA by PCR NEGATIVE NEGATIVE Final    Comment:         The GeneXpert MRSA Assay (FDA approved for NASAL specimens only), is one component of a comprehensive MRSA colonization surveillance program. It is not intended to diagnose MRSA infection nor to guide or monitor treatment for MRSA infections.     Radiology Reports Ct Abdomen Pelvis W Contrast  06/07/2015   ADDENDUM REPORT: 06/07/2015 13:56  ADDENDUM: The urinary bladder is decompressed and difficult to exclude urinary bladder  wall thickening or inflammation.   Electronically Signed   By: Markus Daft M.D.   On: 06/07/2015 13:56   06/07/2015   CLINICAL DATA:  Bilateral lower back pain.  EXAM: CT ABDOMEN AND PELVIS WITH CONTRAST  TECHNIQUE: Multidetector CT imaging of the abdomen and pelvis was performed using the standard protocol following bolus administration of intravenous contrast.  CONTRAST:  130mL OMNIPAQUE IOHEXOL 300 MG/ML  SOLN  COMPARISON:  None.  FINDINGS: There is bibasilar atelectasis, right side greater the left. There is free intraperitoneal air throughout the upper abdomen. Pockets of free air in the lower abdomen. There is a small amount of perihepatic fluid. Normal appearance of the liver and gallbladder. Portal venous system is patent. Normal appearance of the pancreas, spleen and adrenal glands. Normal appearance of both kidneys.  Normal appearance of the stomach and duodenum. There is oral contrast in the distal small bowel and right colon. There is a markedly thickened loop of small bowel in the anterior lower abdomen on sequence 2, image 84. The distal small bowel and the right colon are distended. No significant wall thickening involving the terminal ileum. There is a small amount of fluid in the right lower quadrant of the abdomen. Mild pericolonic inflammatory changes involving the distal descending colon and there are diverticula involving the descending colon and sigmoid colon. There is focal edema or fluid between the sigmoid colon and the thickened loop of small bowel on  sequence 2, image 81. No discrete abscess collection.  Small amount of free fluid in the pelvis. No gross abnormality to the uterus or adnexa tissue. Small amount of free fluid along the anterior lower abdomen adjacent to the thickened loop of small bowel. Patient has a periumbilical hernia with mild mesenteric edema.  There is no significant lymphadenopathy in the abdomen or pelvis. However, there is concern for mild lymphadenopathy in the hilar regions bilaterally.  Facet arthropathy in the lower lumbar spine. No acute bone abnormality.  IMPRESSION: Study is positive for pneumoperitoneum. There is fluid and inflammatory changes throughout the lower abdomen and upper pelvis. A short segment of small bowel has wall thickening and compatible with enteritis. This loop of small bowel is adjacent to the sigmoid colon and there are inflammatory changes adjacent to the distal descending colon and sigmoid colon. Findings raise concern for underlying diverticulitis. These inflammatory changes could represent a perforated diverticulitis with secondary small bowel enteritis. However, a primary small bowel enteritis cannot be excluded. In addition, there is dilatation of the distal small bowel and right colon which could be related to an ileus.  Concern for mild chest lymphadenopathy in the hilar regions. This could be further characterized with a post contrast chest CT.  These results were called by telephone at the time of interpretation on 06/07/2015 at 1:07 pm to Dr. Virgel Manifold , who verbally acknowledged these results.  Electronically Signed: By: Markus Daft M.D. On: 06/07/2015 13:15    CBC  Recent Labs Lab 06/10/15 0349 06/11/15 0043 06/12/15 0417 06/14/15 0924 06/16/15 0815  WBC 14.0* 13.9* 12.4* 11.5* 11.5*  HGB 10.6* 9.5* 9.9* 9.8* 8.6*  HCT 32.8* 28.7* 31.2* 29.7* 26.6*  PLT 389 402* 489* 466* 409*  MCV 87.5 87.5 90.7 90.5 90.8  MCH 28.3 29.0 28.8 29.9 29.4  MCHC 32.3 33.1 31.7 33.0 32.3  RDW 14.8  15.2 15.8* 15.2 15.3    Chemistries   Recent Labs Lab 06/09/15 2235 06/10/15 0349 06/11/15 0340 06/12/15 0417 06/16/15 0815  NA 141 141  143 144 142  K 4.3 4.4 4.5 4.0 3.5  CL 114* 115* 117* 116* 115*  CO2 19* 18* 19* 22 22  GLUCOSE 137* 145* 117* 113* 93  BUN 25* 27* 33* 28* 10  CREATININE 1.25* 1.35* 1.32* 1.16* 0.75  CALCIUM 7.6* 7.7* 8.0* 8.3* 7.6*  MG 1.9  --  2.6*  --   --    ------------------------------------------------------------------------------------------------------------------ estimated creatinine clearance is 114.3 mL/min (by C-G formula based on Cr of 0.75). ------------------------------------------------------------------------------------------------------------------ No results for input(s): HGBA1C in the last 72 hours. ------------------------------------------------------------------------------------------------------------------ No results for input(s): CHOL, HDL, LDLCALC, TRIG, CHOLHDL, LDLDIRECT in the last 72 hours. ------------------------------------------------------------------------------------------------------------------ No results for input(s): TSH, T4TOTAL, T3FREE, THYROIDAB in the last 72 hours.  Invalid input(s): FREET3 ------------------------------------------------------------------------------------------------------------------ No results for input(s): VITAMINB12, FOLATE, FERRITIN, TIBC, IRON, RETICCTPCT in the last 72 hours.  Coagulation profile No results for input(s): INR, PROTIME in the last 168 hours.  No results for input(s): DDIMER in the last 72 hours.  Cardiac Enzymes No results for input(s): CKMB, TROPONINI, MYOGLOBIN in the last 168 hours.  Invalid input(s): CK ------------------------------------------------------------------------------------------------------------------ Invalid input(s): POCBNP    Philipe Laswell D.O. on 06/16/2015 at 12:40 PM  Between 7am to 7pm - Pager - [phone]  After 7pm go to  [email or web address] - password [email or web address]  And look for the night coverage person covering for me after hours  [facility] Office  [phone]

## 2015-06-16 NOTE — Consult Note (Signed)
[facility] Consult   Reason for Consult:  Depression Referring Physician:  Dr. Ree Kida Patient Identification: Deanna Aguilar MRN:  [id number] Principal Diagnosis: Depression, acute Diagnosis:   Patient Active Problem List   Diagnosis Date Noted  . Depression, acute [F32.9] 06/13/2015  . 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 Atrial fibrillation with RVR [I48.91] 06/11/2015  . Morbid obesity [E66.01] 06/10/2015  . Arthritis- on disability [M19.90] 06/10/2015  . Diverticulitis of colon with perforation s/p colectomy/ostomy 06/09/2015 [K57.20] 06/07/2015  . HTN (hypertension) [I10] 06/07/2015  . Acute renal insufficiency [N28.9] 06/07/2015  . Leukocytosis [D72.829] 06/07/2015  . Peritonitis [K65.9]     Total Time spent with patient: 45 minutes  Subjective:   Deanna Aguilar is a 59 y.o. female patient admitted with depression.  HPI:  Deanna Aguilar is a 59 years old African-American female seen, chart reviewed and case discussed with the clinical social worker for psychiatric consultation and evaluation of depression and possible treatment needs. Patient reported she has been depressed about multiple life stressors which she was reluctant to reveal during this evaluation. Patient reported she has been disabled and living alone and has a limited family support. Patient is also denied disturbance of sleep and appetite and active suicidal, homicidal ideation, intention or plans. Patient was offered treatment for depression with medication management and also counseling services. Patient refused to receive any mental health treatment at this time. Patient was requested to call back if she changed her mind at this time. Patient denies previous psychiatric inpatient or outpatient care.  HPI Elements:   Location:  Depression. Quality:  Poor. Severity:  Moderate. Timing:  Hospitalization. Duration:  2 weeks. Context:  Psychosocial stresses.  Past Medical History:  Past  Medical History  Diagnosis Date  . Hypertension     Past Surgical History  Procedure Laterality Date  . Tubal ligation    . Colon resection N/A 06/09/2015    Procedure: SIGMOID COLECTOMY, COLOSTOMY, TAKE DOWN SPLENIC FLECTURE;  Surgeon: Fanny Skates, MD;  Location: [facility];  Service: General;  Laterality: N/A;   Family History: History reviewed. No pertinent family history. Social History:  History  Alcohol Use No     History  Drug Use No    History   Social History  . Marital Status: Legally Separated    Spouse Name: N/A  . Number of Children: N/A  . Years of Education: N/A   Social History Main Topics  . Smoking status: Never Smoker   . Smokeless tobacco: Not on file  . Alcohol Use: No  . Drug Use: No  . Sexual Activity: Not on file   Other Topics Concern  . None   Social History Narrative   Additional Social History:                          Allergies:  No Known Allergies  Labs:  Results for orders placed or performed during the hospital encounter of 06/07/15 (from the past 48 hour(s))  CBC     Status: Abnormal   Collection Time: 06/16/15  8:15 AM  Result Value Ref Range   WBC 11.5 (H) 4.0 - 10.5 K/uL   RBC 2.93 (L) 3.87 - 5.11 MIL/uL   Hemoglobin 8.6 (L) 12.0 - 15.0 g/dL   HCT 26.6 (L) 36.0 - 46.0 %   MCV 90.8 78.0 - 100.0 fL   MCH 29.4 26.0 - 34.0 pg   MCHC 32.3 30.0 - 36.0 g/dL  RDW 15.3 11.5 - 15.5 %   Platelets 409 (H) 150 - 400 K/uL  Basic metabolic panel     Status: Abnormal   Collection Time: 06/16/15  8:15 AM  Result Value Ref Range   Sodium 142 135 - 145 mmol/L   Potassium 3.5 3.5 - 5.1 mmol/L   Chloride 115 (H) 101 - 111 mmol/L   CO2 22 22 - 32 mmol/L   Glucose, Bld 93 65 - 99 mg/dL   BUN 10 6 - 20 mg/dL   Creatinine, Ser 0.75 0.44 - 1.00 mg/dL   Calcium 7.6 (L) 8.9 - 10.3 mg/dL   GFR calc non Af Amer >60 >60 mL/min   GFR calc Af Amer >60 >60 mL/min    Comment: (NOTE) The eGFR has been calculated using the CKD EPI  equation. This calculation has not been validated in all clinical situations. eGFR's persistently <60 mL/min signify possible Chronic Kidney Disease.    Anion gap 5 5 - 15    Vitals: Blood pressure 140/70, pulse 68, temperature 98.2 F (36.8 C), temperature source Oral, resp. rate 18, height 5' 9"  (1.753 m), weight 133.9 kg (295 lb 3.1 oz), SpO2 98 %.  Risk to Self: Is patient at risk for suicide?: No Risk to Others:   Prior Inpatient Therapy:   Prior Outpatient Therapy:    Current Facility-Administered Medications  Medication Dose Route Frequency Provider Last Rate Last Dose  . 0.9 %  sodium chloride infusion  250 mL Intravenous PRN Michael Boston, MD 10 mL/hr at 06/13/15 1731 250 mL at 06/13/15 1731  . 0.9 %  sodium chloride infusion   Intravenous Continuous Velvet Bathe, MD 150 mL/hr at 06/16/15 0957    . acetaminophen (TYLENOL) tablet 1,000 mg  1,000 mg Oral TID Michael Boston, MD   1,000 mg at 06/16/15 0947  . alum & mag hydroxide-simeth (MAALOX/MYLANTA) 200-200-20 MG/5ML suspension 30 mL  30 mL Oral Q6H PRN [name], MD   30 mL at 06/07/15 2014  . amiodarone (PACERONE) tablet 200 mg  200 mg Oral BID Jerline Pain, MD   200 mg at 06/16/15 0947  . antiseptic oral rinse (CPC / CETYLPYRIDINIUM CHLORIDE 0.05%) solution 7 mL  7 mL Mouth Rinse BID Velvet Bathe, MD   7 mL at 06/16/15 1000  . diphenhydrAMINE (BENADRYL) injection 12.5-25 mg  12.5-25 mg Intravenous Q6H PRN Michael Boston, MD      . enoxaparin (LOVENOX) injection 130 mg  1 mg/kg Subcutaneous Q12H Donald Prose Runyon, RPH      . feeding supplement (PRO-STAT SUGAR FREE 64) liquid 30 mL  30 mL Oral Daily Maricela Bo Ostheim, RD   30 mL at 06/15/15 1422  . feeding supplement (RESOURCE BREEZE) (RESOURCE BREEZE) liquid 1 Container  1 Container Oral BID BM Rosezetta Schlatter, RD   1 Container at 06/15/15 1213  . HYDROmorphone (DILAUDID) injection 1-2 mg  1-2 mg Intravenous Q2H PRN Excell Seltzer, MD   2 mg at 06/16/15 1341  . lip balm  (CARMEX) ointment 1 application  1 application Topical BID Michael Boston, MD   1 application at 81/01/75 2214  . magic mouthwash  15 mL Oral QID PRN Michael Boston, MD   15 mL at 06/12/15 2117  . menthol-cetylpyridinium (CEPACOL) lozenge 3 mg  1 lozenge Oral PRN Michael Boston, MD      . methocarbamol (ROBAXIN) tablet 750 mg  750 mg Oral TID Erby Pian, NP   750 mg at 06/16/15 0947  . metoCLOPramide (  REGLAN) injection 5-10 mg  5-10 mg Intravenous Q6H PRN Michael Boston, MD   10 mg at 06/13/15 1503  . metoCLOPramide (REGLAN) tablet 5-10 mg  5-10 mg Oral Q6H PRN Michael Boston, MD      . metoprolol (LOPRESSOR) injection 5 mg  5 mg Intravenous Once Jeryl Columbia, NP   5 mg at 06/10/15 0903  . metoprolol tartrate (LOPRESSOR) tablet 25 mg  25 mg Oral BID Jeryl Columbia, NP   25 mg at 06/16/15 0947  . ondansetron (ZOFRAN) tablet 4 mg  4 mg Oral Q6H PRN [name], MD       Or  . ondansetron (ZOFRAN) injection 4 mg  4 mg Intravenous Q6H PRN [name], MD   4 mg at 06/12/15 0857  . oxyCODONE (Oxy IR/ROXICODONE) immediate release tablet 15-20 mg  15-20 mg Oral Q4H PRN Emina Riebock, NP   15 mg at 06/16/15 0947  . phenol (CHLORASEPTIC) mouth spray 2 spray  2 spray Mouth/Throat PRN Michael Boston, MD      . polyethylene glycol (MIRALAX / GLYCOLAX) packet 17 g  17 g Oral Daily Michael Boston, MD   17 g at 06/16/15 0948  . promethazine (PHENERGAN) injection 6.25-12.5 mg  6.25-12.5 mg Intravenous Q4H PRN Michael Boston, MD   12.5 mg at 06/16/15 0230  . saccharomyces boulardii (FLORASTOR) capsule 250 mg  250 mg Oral BID Michael Boston, MD   250 mg at 06/16/15 0947  . sodium chloride 0.9 % injection 3 mL  3 mL Intravenous Q12H Michael Boston, MD   3 mL at 06/13/15 1044  . sodium chloride 0.9 % injection 3 mL  3 mL Intravenous PRN Michael Boston, MD        Musculoskeletal: Strength & Muscle Tone: decreased Gait & Station: unable to stand Patient leans: Sat in a chair next to her bed  Psychiatric Specialty  Exam: Physical Exam as per history and physical   ROS multiple somatic complaints including depression, loss of energy and enthusiasm and interest. She also has poor self-esteem and somewhat isolated. No Fever-chills, No Headache, No changes with Vision or hearing, reports vertigo No problems swallowing food or Liquids, No Chest pain, Cough or Shortness of Breath, No Abdominal pain, No Nausea or Vommitting, Bowel movements are regular, No Blood in stool or Urine, No dysuria, No new skin rashes or bruises, No new joints pains-aches,  No new weakness, tingling, numbness in any extremity, No recent weight gain or loss, No polyuria, polydypsia or polyphagia,   A full 10 point Review of Systems was done, except as stated above, all other Review of Systems were negative.  Blood pressure 140/70, pulse 68, temperature 98.2 F (36.8 C), temperature source Oral, resp. rate 18, height 5' 9"  (1.753 m), weight 133.9 kg (295 lb 3.1 oz), SpO2 98 %.Body mass index is 43.57 kg/(m^2).  General Appearance: Guarded  Eye Contact::  Good  Speech:  Clear and Coherent  Volume:  Decreased  Mood:  Depressed  Affect:  Constricted and Depressed  Thought Process:  Coherent and Goal Directed  Orientation:  Full (Time, Place, and Person)  Thought Content:  Rumination  Suicidal Thoughts:  No  Homicidal Thoughts:  No  Memory:  Immediate;   Fair Recent;   Fair  Judgement:  Fair  Insight:  Fair  Psychomotor Activity:  Decreased  Concentration:  Fair  Recall:  Good  Fund of Knowledge:Good  Language: Good  Akathisia:  Negative  Handed:  Right  AIMS (if indicated):  Assets:  Communication Skills Desire for Improvement Financial Resources/Insurance Housing Leisure Time Resilience  ADL's:  Impaired  Cognition: WNL  Sleep:      Medical Decision Making: Review of Psycho-Social Stressors (1), Established Problem, Worsening (2), Review of Last Therapy Session (1), Review or order medicine tests (1),  Review of Medication Regimen & Side Effects (2) and Review of New Medication or Change in Dosage (2)  Treatment Plan Summary: Patient endorses multiple medical problems and also depression but no active suicidal/homicidal ideation, intention or plan. Patient has no evidence of psychosis. Patient is reluctant to receive mental health treatment for unknown reasons. Daily contact with patient to assess and evaluate symptoms and progress in treatment and Medication management  Plan:  Suicidal ideation: Patient has no safety concerns at this time Depression: Offered counseling services and medication management but patient gently refused Patient does not meet criteria for psychiatric inpatient admission. Supportive therapy provided about ongoing stressors.  Appreciate psychiatric consultation and will sign off at this time Please contact [phone] or [phone] if needs further assistance   Disposition: Patient will be referred to the outpatient psychiatric medication management for depression and also counseling services if patient is interested.   Trenyce Loera,JANARDHAHA R. 06/16/2015 3:00 PM

## 2015-06-16 NOTE — Consult Note (Addendum)
WOC wound consult note Reason for Consult: VAC (NPWT) application to midline abdominal surgical wound.  Was discontinued due to bleeding. Initiating therapy once again today.  Wound type:Midline surgical wound Pressure Ulcer POA: N/A Measurement:26 cm x 4 cm x 6 cm  Wound bed:100% pink and moist Drainage (amount, consistency, odor) Moderate pink tinged drainage on dressings removed.  Some blood clots noted with cleansing.  Periwound:Intact.  Colostomy 8 cm left of incision line.  2 separate dressings are easily achieved.  Dressing procedure/placement/frequency:Cleanse abdominal wound with NS and pat gently dry.  Gently fill wound bed with black foam.  (2 pieces used today).  Cover with VAC drape.  125 mmHg negative pressure.  Change Mon-Wed-Fri. Bedside nurse may perform.   Will not follow at this time.  Please re-consult if needed.  Domenic Moras RN BSN [facility] Pager [phone]     [place] ostomy follow up Stoma type/location: LLQ Colostomy Stomal assessment/size: Oval, slightly less than 1 3/4" Flush with abdomen, will use barrier ring.  Pink moist and viable.   Peristomal assessment: Intact Treatment options for stomal/peristomal skin: Barrier ring for flush stoma Output Soft brown stool Ostomy pouching: 2pc. 2 1/4" pouch with barrier ring.  Education provided: Patient talked through the pouch change procedure.  Minimally interested in learning principles of self care.  Was pre medicated prior to start due to pain with wound care.  Has been up to chair for 2 hours.  Back in bed now.  Enrolled patient in [place] Start Discharge program: No WOC team will continue to follow for ongoing teaching.  We will remain available to patient, medical and nursing teams.  Domenic Moras RN BSN [place] Pager [phone]

## 2015-06-17 LAB — BASIC METABOLIC PANEL
ANION GAP: 7 (ref 5–15)
BUN: 8 mg/dL (ref 6–20)
CALCIUM: 7.5 mg/dL — AB (ref 8.9–10.3)
CO2: 21 mmol/L — ABNORMAL LOW (ref 22–32)
CREATININE: 0.81 mg/dL (ref 0.44–1.00)
Chloride: 116 mmol/L — ABNORMAL HIGH (ref 101–111)
GFR calc Af Amer: >60 mL/min (ref >60)
GFR calc non Af Amer: >60 mL/min (ref >60)
Glucose, Bld: 86 mg/dL (ref 65–99)
Potassium: 3.3 mmol/L — ABNORMAL LOW (ref 3.5–5.1)
SODIUM: 144 mmol/L (ref 135–145)

## 2015-06-17 LAB — CBC
HCT: 24.4 % — ABNORMAL LOW (ref 36.0–46.0)
Hemoglobin: 8 g/dL — ABNORMAL LOW (ref 12.0–15.0)
MCH: 29.2 pg (ref 26.0–34.0)
MCHC: 32.8 g/dL (ref 30.0–36.0)
MCV: 89.1 fL (ref 78.0–100.0)
Platelets: 397 10*3/uL (ref 150–400)
RBC: 2.74 MIL/uL — AB (ref 3.87–5.11)
RDW: 15.1 % (ref 11.5–15.5)
WBC: 10.1 10*3/uL (ref 4.0–10.5)

## 2015-06-17 MED ORDER — POTASSIUM CHLORIDE 20 MEQ PO PACK
40.0000 meq | PACK | Freq: Once | ORAL | Status: DC
  Filled 2015-06-17: qty 2

## 2015-06-17 MED ORDER — DIPHENHYDRAMINE HCL 25 MG PO CAPS
25.0000 mg | ORAL_CAPSULE | Freq: Four times a day (QID) | ORAL | Status: DC | PRN
  Filled 2015-06-17: qty 1

## 2015-06-17 MED ORDER — HYDROMORPHONE HCL 1 MG/ML IJ SOLN: 1.0000 mg | INTRAMUSCULAR | Status: DC | PRN

## 2015-06-17 MED ORDER — POTASSIUM CHLORIDE CRYS ER 20 MEQ PO TBCR
40.0000 meq | EXTENDED_RELEASE_TABLET | Freq: Once | ORAL | Status: AC
  Administered 2015-06-17: 40 meq via ORAL
  Filled 2015-06-17: qty 2

## 2015-06-17 MED ORDER — APIXABAN 5 MG PO TABS
5.0000 mg | ORAL_TABLET | Freq: Two times a day (BID) | ORAL | Status: DC
  Administered 2015-06-17 – 2015-06-18 (×2): 5 mg via ORAL
  Filled 2015-06-17 (×2): qty 1

## 2015-06-17 NOTE — Progress Notes (Signed)
Spoke with pt in length concerning [place] and pt began to cry. Pt rents a room until her apartment comes available. Pt selected [facility] for HHRN/NA. Referral given need to [facility]. Referral given to Chaplain.

## 2015-06-17 NOTE — Progress Notes (Signed)
PT Cancellation Note  Patient Details Name: Deanna Aguilar MRN: [id number] DOB: 07-18-1956   Cancelled Treatment:    Reason Eval/Treat Not Completed: Patient declined, no reason specified. Will check back as schedule allows. Thanks.    Weston Anna, MPT Pager: [phone]

## 2015-06-17 NOTE — Progress Notes (Signed)
Chaplain was called to the patient's room. Chaplain provided a listening presence as well as emotional support. Patient is due to be discharged soon with no specific place to go. Patient has some anxiety with regard to her circumstances. Chaplain encouraged patient and provided some suggestions. Chaplain will follow up.   06/17/15 1200  Clinical Encounter Type  Visited With Patient  Visit Type Initial;Spiritual support;Social support  Referral From Care management

## 2015-06-17 NOTE — Discharge Instructions (Signed)

## 2015-06-17 NOTE — Progress Notes (Signed)
Patient ID: Deanna Aguilar, female   DOB: 1956-10-08, 59 y.o.   MRN: [id number]     [facility]      [address] [place], [place] [address]    Phone: [phone] FAX: [phone]     Subjective: Pt looks great.  Sitting up in the chair and smiling.  Hopes to walk with therapies.  Nausea is better.   Objective:  Vital signs:  Filed Vitals:   06/16/15 0535 06/16/15 1425 06/16/15 2106 06/17/15 0545  BP: 129/66 140/70 148/64 149/78  Pulse: 63 68 75 56  Temp: 98.6 F (37 C) 98.2 F (36.8 C) 98.6 F (37 C) 98.1 F (36.7 C)  TempSrc: Oral Oral Oral Oral  Resp: 18 18 18 18   Height:      Weight:      SpO2: 98% 98% 98% 100%    Last BM Date: 06/16/15  Intake/Output   Yesterday:  07/13 0701 - 07/14 0700 In: 6072.5 [P.O.:480; I.V.:5592.5] Out: 515 [Urine:450; Drains:65]   Physical Exam: General: Pt awake/alert/oriented x4 in no acute distress Abdomen: Soft. Nondistended. Mildly tender at incisions only. VAC with serous output, no bleeding.   Ostomy is functioning.   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   Atrial fibrillation with RVR   Colostomy in place    Results:   Labs: Results for orders placed or performed during the hospital encounter of 06/07/15 (from the past 48 hour(s))  CBC     Status: Abnormal   Collection Time: 06/16/15  8:15 AM  Result Value Ref Range   WBC 11.5 (H) 4.0 - 10.5 K/uL   RBC 2.93 (L) 3.87 - 5.11 MIL/uL   Hemoglobin 8.6 (L) 12.0 - 15.0 g/dL   HCT 26.6 (L) 36.0 - 46.0 %   MCV 90.8 78.0 - 100.0 fL   MCH 29.4 26.0 - 34.0 pg   MCHC 32.3 30.0 - 36.0 g/dL   RDW 15.3 11.5 - 15.5 %   Platelets 409 (H) 150 - 400 K/uL  Basic metabolic panel     Status:  Abnormal   Collection Time: 06/16/15  8:15 AM  Result Value Ref Range   Sodium 142 135 - 145 mmol/L   Potassium 3.5 3.5 - 5.1 mmol/L   Chloride 115 (H) 101 - 111 mmol/L   CO2 22 22 - 32 mmol/L   Glucose, Bld 93 65 - 99 mg/dL   BUN 10 6 - 20 mg/dL   Creatinine, Ser 0.75 0.44 - 1.00 mg/dL   Calcium 7.6 (L) 8.9 - 10.3 mg/dL   GFR calc non Af Amer >60 >60 mL/min   GFR calc Af Amer >60 >60 mL/min    Comment: (NOTE) The eGFR has been calculated using the CKD EPI equation. This calculation has not been validated in all clinical situations. eGFR's persistently <60 mL/min signify possible Chronic Kidney Disease.    Anion gap 5 5 - 15  CBC     Status: Abnormal   Collection Time: 06/17/15  4:17 AM  Result Value Ref Range   WBC 10.1 4.0 - 10.5 K/uL   RBC 2.74 (L) 3.87 - 5.11 MIL/uL   Hemoglobin 8.0 (L) 12.0 - 15.0 g/dL   HCT 24.4 (L) 36.0 - 46.0 %  MCV 89.1 78.0 - 100.0 fL   MCH 29.2 26.0 - 34.0 pg   MCHC 32.8 30.0 - 36.0 g/dL   RDW 15.1 11.5 - 15.5 %   Platelets 397 150 - 400 K/uL  Basic metabolic panel     Status: Abnormal   Collection Time: 06/17/15  4:17 AM  Result Value Ref Range   Sodium 144 135 - 145 mmol/L   Potassium 3.3 (L) 3.5 - 5.1 mmol/L   Chloride 116 (H) 101 - 111 mmol/L   CO2 21 (L) 22 - 32 mmol/L   Glucose, Bld 86 65 - 99 mg/dL   BUN 8 6 - 20 mg/dL   Creatinine, Ser 0.81 0.44 - 1.00 mg/dL   Calcium 7.5 (L) 8.9 - 10.3 mg/dL   GFR calc non Af Amer >60 >60 mL/min   GFR calc Af Amer >60 >60 mL/min    Comment: (NOTE) The eGFR has been calculated using the CKD EPI equation. This calculation has not been validated in all clinical situations. eGFR's persistently <60 mL/min signify possible Chronic Kidney Disease.    Anion gap 7 5 - 15    Imaging / Studies: No results found.  Medications / Allergies:  Scheduled Meds: . acetaminophen  1,000 mg Oral TID  . amiodarone  200 mg Oral BID  . antiseptic oral rinse  7 mL Mouth Rinse BID  . enoxaparin (LOVENOX)  injection  1 mg/kg Subcutaneous Q12H  . feeding supplement (PRO-STAT SUGAR FREE 64)  30 mL Oral Daily  . feeding supplement  1 Container Oral BID BM  . lip balm  1 application Topical BID  . methocarbamol  750 mg Oral TID  . metoprolol  5 mg Intravenous Once  . metoprolol tartrate  25 mg Oral BID  . polyethylene glycol  17 g Oral Daily  . potassium chloride  40 mEq Oral Once  . saccharomyces boulardii  250 mg Oral BID  . sodium chloride  3 mL Intravenous Q12H   Continuous Infusions: . sodium chloride 150 mL/hr at 06/17/15 0130   PRN Meds:.sodium chloride, alum & mag hydroxide-simeth, diphenhydrAMINE, HYDROmorphone (DILAUDID) injection, magic mouthwash, menthol-cetylpyridinium, metoCLOPramide (REGLAN) injection, metoCLOPramide, ondansetron **OR** ondansetron (ZOFRAN) IV, oxyCODONE, phenol, promethazine, sodium chloride  Antibiotics: Anti-infectives    Start     Dose/Rate Route Frequency Ordered Stop   06/08/15 0615  ertapenem (INVANZ) 1 g in sodium chloride 0.9 % 50 mL IVPB     1 g 100 mL/hr over 30 Minutes Intravenous Daily 06/08/15 0609 06/16/15 1017   06/07/15 2359  ciprofloxacin (CIPRO) IVPB 400 mg  Status:  Discontinued     400 mg 200 mL/hr over 60 Minutes Intravenous Every 12 hours 06/07/15 1524 06/08/15 0609   06/07/15 1500  metroNIDAZOLE (FLAGYL) IVPB 500 mg  Status:  Discontinued     500 mg 100 mL/hr over 60 Minutes Intravenous Every 8 hours 06/07/15 1459 06/08/15 0609   06/07/15 1300  ciprofloxacin (CIPRO) IVPB 400 mg     400 mg 200 mL/hr over 60 Minutes Intravenous  Once 06/07/15 1258 06/07/15 1422   06/07/15 1300  metroNIDAZOLE (FLAGYL) IVPB 500 mg     500 mg 100 mL/hr over 60 Minutes Intravenous  Once 06/07/15 1258 06/07/15 1422         Assessment/Plan Ruptured diverticulitis with peritonitis POD#8 exploratory laparotomy, sigmoid colectomy, takedown of splenic flexure, end colostomy---Dr. Dalbert Batman -stable, tolerating POs, afebrile and having ostomy function. No  further bleeding from wound. -ostomy teaching -needs to mobilize and sit up  in chair!! -IS -continue VAC AF with RVR-on [place] Renal failure ABL anemia-CBC in AM.  VTE prophylaxis-SCD/lovenox.may transition to Eliquis.  D/W Dr. Ree Kida.  CBC in AM. FEN-tolerating POs, increased PO pain meds scale, c/w scheduled tylenol and robaxin. change IV for breakthrough only.  Dispo-anticipate she will be ready for DC next 24h.   Erby Pian, [facility] Pager [phone](7A-4:30P)   06/17/2015 9:23 AM

## 2015-06-17 NOTE — Consult Note (Signed)
WOC ostomy follow up Stoma type/location: LLQ, colstomy Output liquid brown stool Ostomy pouching: 2pc. 2 1/4" with barrier ring  Education provided: attempted to educate patient on emptying pouch and lock and roll closure when she began to cry and sob regarding her current living situation. Talked with patient for over 30 minutes about the options of rehab vs HHRN. She continues to be discourage and says "I'm just so down, I don't even feel like a woman, I'm just here".  Tried to offer emotional support and guidance.  Aborted any type of education as I did not feel our conversation would be beneficial at this time.  I did stress the importance of her learning to empty the pouch when nursing staff comes in and I did have the opportunity to explain "burping" the pouch, however she did not care to participate in this today.  Will need further education on pouch changes and care, will need HHRN or short term rehab.  Enrolled patient in [place] Start Discharge program: Yes/No   [place] team will follow along with you for continued support with ostomy and wound care [place], [place]

## 2015-06-17 NOTE — Progress Notes (Signed)
[facility]                                                                              Patient Demographics  Deanna Aguilar, is a 59 y.o. female, DOB - May 24, 1956, [id number]  Admit date - 06/07/2015   Admitting Physician [name], MD  Outpatient Primary MD for the patient is [name], MD  LOS - 10   Chief Complaint  Patient presents with  . Back Pain  . Abdominal Pain      HPI on 06/07/2015 by Dr. [name] Deanna Aguilar is a 59 y.o. female with hypertension who presents with abdominal pain for 2 days. She points to the suprapubic area and states that it radiates from there to the right and left upper abdomen into her back. She feels pain when she tries to urinate as well. Pain quite severe. She has had nausea but no vomiting. She has had no appetite. She's felt hot and cold but has not checked her temperature. He has been taking Aleve and BC powders help control pain. Dilaudid given in the ER has helped her pain.  Assessment & Plan   Sepsis secondary to ruptured diverticulitis with peritonitis -Patient did have hypotension as well as leukocytosis which have both improved -S/p exploratory laparotomy, sigmoid colectomy, takedown flexure and end colostomy -Currently tolerating diet -Ostomy appears to be functioning well -Encouraged patient to get out of bed and mobilize -VAC to be resumed -Patient received 8 days of Invanz, discontinued 7/13 by surgery -PT consulted and recommended SNF -Continue pain control PRN, would favor discontinuing IV form  Atrial fibrillation with RVR -Currently on amiodarone, metoprolol -Cardiology consulted and appreciated -Patient started on full dose lovenox 7/14, no evidence of bleeding (although slight drop in Hb) -Will continue to monitor Hb and transition patient to Eliquis (discussed with surgery)  Acute kidney failure -Resolved -Patient was taking NSAIDs as well as ACE inhibitor -Creatinine currently  0.81  Depression -Possibly situational -Psychiatry consulted and appreciate, recommended outpatient follow-up -Chaplain has been consulted  Acute blood loss Anemia -Baseline hemoglobin appears to be 9, currently a 8.0 -Will continue to monitor hemoglobin closely as patient started on liquids today.  Code Status: Full   Family Communication: None at bedside   Disposition Plan: Admitted. Continue to monitor CBC. If stable, patient may be [facility] on 06/18/2015  Time Spent in minutes   30 minutes  Procedures  Exploratory laparotomy, sigmoid colectomy, takedown flexure and end colostomy  Consults   Cardiology General surgery  Psychiatry  DVT Prophylaxis  Lovenox  Lab Results  Component Value Date   PLT 397 06/17/2015    Medications  Scheduled Meds: . acetaminophen  1,000 mg Oral TID  . amiodarone  200 mg Oral BID  . antiseptic oral rinse  7 mL Mouth Rinse BID  . apixaban  5 mg Oral BID  . feeding supplement (PRO-STAT SUGAR FREE 64)  30 mL Oral Daily  . feeding supplement  1 Container Oral BID BM  . lip balm  1 application Topical BID  . methocarbamol  750 mg Oral TID  . metoprolol  5 mg Intravenous Once  . metoprolol tartrate  25  mg Oral BID  . polyethylene glycol  17 g Oral Daily  . saccharomyces boulardii  250 mg Oral BID  . sodium chloride  3 mL Intravenous Q12H   Continuous Infusions: . sodium chloride 150 mL/hr at 06/17/15 0130   PRN Meds:.sodium chloride, alum & mag hydroxide-simeth, diphenhydrAMINE, HYDROmorphone (DILAUDID) injection, magic mouthwash, menthol-cetylpyridinium, metoCLOPramide (REGLAN) injection, metoCLOPramide, ondansetron **OR** ondansetron (ZOFRAN) IV, oxyCODONE, phenol, promethazine, sodium chloride  Antibiotics    Anti-infectives    Start     Dose/Rate Route Frequency Ordered Stop   06/08/15 0615  ertapenem (INVANZ) 1 g in sodium chloride 0.9 % 50 mL IVPB     1 g 100 mL/hr over 30 Minutes Intravenous Daily 06/08/15 0609  06/16/15 1017   06/07/15 2359  ciprofloxacin (CIPRO) IVPB 400 mg  Status:  Discontinued     400 mg 200 mL/hr over 60 Minutes Intravenous Every 12 hours 06/07/15 1524 06/08/15 0609   06/07/15 1500  metroNIDAZOLE (FLAGYL) IVPB 500 mg  Status:  Discontinued     500 mg 100 mL/hr over 60 Minutes Intravenous Every 8 hours 06/07/15 1459 06/08/15 0609   06/07/15 1300  ciprofloxacin (CIPRO) IVPB 400 mg     400 mg 200 mL/hr over 60 Minutes Intravenous  Once 06/07/15 1258 06/07/15 1422   06/07/15 1300  metroNIDAZOLE (FLAGYL) IVPB 500 mg     500 mg 100 mL/hr over 60 Minutes Intravenous  Once 06/07/15 1258 06/07/15 1422      Subjective:   Deanna Aguilar seen and examined today. Patient states she is feeling better this morning. Currently denies any vomiting states it has gotten better. Patient currently sitting up smiling and in chair. Denies any chest pain, shortness of breath, headache or dizziness.   Objective:   Filed Vitals:   06/16/15 0535 06/16/15 1425 06/16/15 2106 06/17/15 0545  BP: 129/66 140/70 148/64 149/78  Pulse: 63 68 75 56  Temp: 98.6 F (37 C) 98.2 F (36.8 C) 98.6 F (37 C) 98.1 F (36.7 C)  TempSrc: Oral Oral Oral Oral  Resp: 18 18 18 18   Height:      Weight:      SpO2: 98% 98% 98% 100%    Wt Readings from Last 3 Encounters:  06/12/15 133.9 kg (295 lb 3.1 oz)     Intake/Output Summary (Last 24 hours) at 06/17/15 1111 Last data filed at 06/17/15 0700  Gross per 24 hour  Intake 5832.5 ml  Output    515 ml  Net 5317.5 ml    Exam  General: Well developed, well nourished, no distress  HEENT: NCAT, mucous membranes moist.   Cardiovascular: S1 S2 auscultated, no rubs, murmurs or gallops. Regular rate and rhythm.  Respiratory: Clear to auscultation bilaterally with equal chest rise  Abdomen: Soft, obese, incisional tenderness, nondistended, + bowel sounds, ostomy, VAC in place with no blood noted  Extremities: warm dry without cyanosis clubbing or  edema  Neuro: AAOx3,  nonfocal  Psych: Appropriate mood and affect, pleasant  Data Review   Micro Results Recent Results (from the past 240 hour(s))  MRSA PCR Screening     Status: None   Collection Time: 06/07/15  4:13 PM  Result Value Ref Range Status   MRSA by PCR NEGATIVE NEGATIVE Final    Comment:        The GeneXpert MRSA Assay (FDA approved for NASAL specimens only), is one component of a comprehensive MRSA colonization surveillance program. It is not intended to diagnose MRSA infection nor to guide  or monitor treatment for MRSA infections.     Radiology Reports Ct Abdomen Pelvis W Contrast  06/07/2015   ADDENDUM REPORT: 06/07/2015 13:56  ADDENDUM: The urinary bladder is decompressed and difficult to exclude urinary bladder wall thickening or inflammation.   Electronically Signed   By: Markus Daft M.D.   On: 06/07/2015 13:56   06/07/2015   CLINICAL DATA:  Bilateral lower back pain.  EXAM: CT ABDOMEN AND PELVIS WITH CONTRAST  TECHNIQUE: Multidetector CT imaging of the abdomen and pelvis was performed using the standard protocol following bolus administration of intravenous contrast.  CONTRAST:  177mL OMNIPAQUE IOHEXOL 300 MG/ML  SOLN  COMPARISON:  None.  FINDINGS: There is bibasilar atelectasis, right side greater the left. There is free intraperitoneal air throughout the upper abdomen. Pockets of free air in the lower abdomen. There is a small amount of perihepatic fluid. Normal appearance of the liver and gallbladder. Portal venous system is patent. Normal appearance of the pancreas, spleen and adrenal glands. Normal appearance of both kidneys.  Normal appearance of the stomach and duodenum. There is oral contrast in the distal small bowel and right colon. There is a markedly thickened loop of small bowel in the anterior lower abdomen on sequence 2, image 84. The distal small bowel and the right colon are distended. No significant wall thickening involving the terminal ileum. There  is a small amount of fluid in the right lower quadrant of the abdomen. Mild pericolonic inflammatory changes involving the distal descending colon and there are diverticula involving the descending colon and sigmoid colon. There is focal edema or fluid between the sigmoid colon and the thickened loop of small bowel on sequence 2, image 81. No discrete abscess collection.  Small amount of free fluid in the pelvis. No gross abnormality to the uterus or adnexa tissue. Small amount of free fluid along the anterior lower abdomen adjacent to the thickened loop of small bowel. Patient has a periumbilical hernia with mild mesenteric edema.  There is no significant lymphadenopathy in the abdomen or pelvis. However, there is concern for mild lymphadenopathy in the hilar regions bilaterally.  Facet arthropathy in the lower lumbar spine. No acute bone abnormality.  IMPRESSION: Study is positive for pneumoperitoneum. There is fluid and inflammatory changes throughout the lower abdomen and upper pelvis. A short segment of small bowel has wall thickening and compatible with enteritis. This loop of small bowel is adjacent to the sigmoid colon and there are inflammatory changes adjacent to the distal descending colon and sigmoid colon. Findings raise concern for underlying diverticulitis. These inflammatory changes could represent a perforated diverticulitis with secondary small bowel enteritis. However, a primary small bowel enteritis cannot be excluded. In addition, there is dilatation of the distal small bowel and right colon which could be related to an ileus.  Concern for mild chest lymphadenopathy in the hilar regions. This could be further characterized with a post contrast chest CT.  These results were called by telephone at the time of interpretation on 06/07/2015 at 1:07 pm to Dr. Virgel Manifold , who verbally acknowledged these results.  Electronically Signed: By: Markus Daft M.D. On: 06/07/2015 13:15    CBC  Recent  Labs Lab 06/11/15 0043 06/12/15 0417 06/14/15 0924 06/16/15 0815 06/17/15 0417  WBC 13.9* 12.4* 11.5* 11.5* 10.1  HGB 9.5* 9.9* 9.8* 8.6* 8.0*  HCT 28.7* 31.2* 29.7* 26.6* 24.4*  PLT 402* 489* 466* 409* 397  MCV 87.5 90.7 90.5 90.8 89.1  MCH 29.0 28.8 29.9 29.4  29.2  MCHC 33.1 31.7 33.0 32.3 32.8  RDW 15.2 15.8* 15.2 15.3 15.1    Chemistries   Recent Labs Lab 06/11/15 0340 06/12/15 0417 06/16/15 0815 06/17/15 0417  NA 143 144 142 144  K 4.5 4.0 3.5 3.3*  CL 117* 116* 115* 116*  CO2 19* 22 22 21*  GLUCOSE 117* 113* 93 86  BUN 33* 28* 10 8  CREATININE 1.32* 1.16* 0.75 0.81  CALCIUM 8.0* 8.3* 7.6* 7.5*  MG 2.6*  --   --   --    ------------------------------------------------------------------------------------------------------------------ estimated creatinine clearance is 112.9 mL/min (by C-G formula based on Cr of 0.81). ------------------------------------------------------------------------------------------------------------------ No results for input(s): HGBA1C in the last 72 hours. ------------------------------------------------------------------------------------------------------------------ No results for input(s): CHOL, HDL, LDLCALC, TRIG, CHOLHDL, LDLDIRECT in the last 72 hours. ------------------------------------------------------------------------------------------------------------------ No results for input(s): TSH, T4TOTAL, T3FREE, THYROIDAB in the last 72 hours.  Invalid input(s): FREET3 ------------------------------------------------------------------------------------------------------------------ No results for input(s): VITAMINB12, FOLATE, FERRITIN, TIBC, IRON, RETICCTPCT in the last 72 hours.  Coagulation profile No results for input(s): INR, PROTIME in the last 168 hours.  No results for input(s): DDIMER in the last 72 hours.  Cardiac Enzymes No results for input(s): CKMB, TROPONINI, MYOGLOBIN in the last 168 hours.  Invalid input(s):  CK ------------------------------------------------------------------------------------------------------------------ Invalid input(s): POCBNP    Eleisha Branscomb D.O. on 06/17/2015 at 11:11 AM  Between 7am to 7pm - Pager - [phone]  After 7pm go to [email or web address] - password [email or web address]  And look for the night coverage person covering for me after hours  [facility] Office  [phone]

## 2015-06-17 NOTE — Progress Notes (Addendum)
PT Cancellation Note  Patient Details Name: HULDA GAUSMAN MRN: [id number] DOB: [birth year]   Cancelled Treatment:    Reason Eval/Treat Not Completed: 2nd attempt today: Patient refused, no reason specified. Multiple attempts to work with pt this week. If pt continues to refuse participaton with therapy, will have to sign off. Thanks.    Weston Anna, MPT Pager: [phone]

## 2015-06-18 LAB — CBC
HEMATOCRIT: 25 % — AB (ref 36.0–46.0)
HEMOGLOBIN: 8 g/dL — AB (ref 12.0–15.0)
MCH: 28.8 pg (ref 26.0–34.0)
MCHC: 32 g/dL (ref 30.0–36.0)
MCV: 89.9 fL (ref 78.0–100.0)
Platelets: 434 10*3/uL — ABNORMAL HIGH (ref 150–400)
RBC: 2.78 MIL/uL — ABNORMAL LOW (ref 3.87–5.11)
RDW: 15.1 % (ref 11.5–15.5)
WBC: 9.1 10*3/uL (ref 4.0–10.5)

## 2015-06-18 LAB — BASIC METABOLIC PANEL
ANION GAP: 4 — AB (ref 5–15)
BUN: 6 mg/dL (ref 6–20)
CO2: 23 mmol/L (ref 22–32)
CREATININE: 0.78 mg/dL (ref 0.44–1.00)
Calcium: 7.7 mg/dL — ABNORMAL LOW (ref 8.9–10.3)
Chloride: 114 mmol/L — ABNORMAL HIGH (ref 101–111)
GFR calc non Af Amer: >60 mL/min (ref >60)
Glucose, Bld: 101 mg/dL — ABNORMAL HIGH (ref 65–99)
POTASSIUM: 3.6 mmol/L (ref 3.5–5.1)
SODIUM: 141 mmol/L (ref 135–145)

## 2015-06-18 MED ORDER — BOOST / RESOURCE BREEZE PO LIQD: 1.0000 | Freq: Two times a day (BID) | ORAL | Status: DC

## 2015-06-18 MED ORDER — APIXABAN 5 MG PO TABS: 5.0000 mg | ORAL_TABLET | Freq: Two times a day (BID) | ORAL | Status: DC

## 2015-06-18 MED ORDER — AMIODARONE HCL 200 MG PO TABS: 200.0000 mg | ORAL_TABLET | Freq: Two times a day (BID) | ORAL | Status: DC

## 2015-06-18 MED ORDER — PRO-STAT SUGAR FREE PO LIQD: 30.0000 mL | Freq: Every day | ORAL | Status: DC

## 2015-06-18 MED ORDER — OXYCODONE HCL 10 MG PO TABS: 10.0000 mg | ORAL_TABLET | ORAL | Status: DC | PRN

## 2015-06-18 MED ORDER — METOPROLOL TARTRATE 25 MG PO TABS: 25.0000 mg | ORAL_TABLET | Freq: Two times a day (BID) | ORAL | Status: DC

## 2015-06-18 MED ORDER — METOCLOPRAMIDE HCL 5 MG PO TABS
5.0000 mg | ORAL_TABLET | Freq: Four times a day (QID) | ORAL | Status: DC | PRN
Start: 2015-06-18 — End: 2015-08-18

## 2015-06-18 MED ORDER — SACCHAROMYCES BOULARDII 250 MG PO CAPS: 250.0000 mg | ORAL_CAPSULE | Freq: Two times a day (BID) | ORAL | Status: DC

## 2015-06-18 NOTE — Progress Notes (Signed)
Report called to Ames Coupe, RN at [facility].  Patient discharged to [facility] via ambulance.  Wound care completed by wound nurse before discharge.  Patient remains tearful. Support given. Chaplain in to see patient earlier this morning.  VSS.  No complaints.

## 2015-06-18 NOTE — Clinical Social Work Placement (Signed)
Patient is set to discharge to [facility] today. Patient & sister aware. Discharge packet given to RN, Isabell Jarvis called for transport to pickup at 1:30pm.     Raynaldo Opitz, [place] Social Worker cell #: [phone]   CLINICAL SOCIAL WORK PLACEMENT  NOTE  Date:  06/18/2015  Patient Details  Name: Deanna Aguilar MRN: [id number] Date of Birth: 1956/10/29  Clinical Social Work is seeking post-discharge placement for this patient at the [place] level of care (*CSW will initial, date and re-position this form in  chart as items are completed):  Yes   Patient/family provided with [place] Work Department's list of facilities offering this level of care within the geographic area requested by the patient (or if unable, by the patient's family).  Yes   Patient/family informed of their freedom to choose among providers that offer the needed level of care, that participate in Medicare, Medicaid or managed care program needed by the patient, have an available bed and are willing to accept the patient.  Yes   Patient/family informed of [place]'s ownership interest in [facility] and [facility], as well as of the fact that they are under no obligation to receive care at these facilities.  PASRR submitted to EDS on 06/16/15     PASRR number received on 06/16/15     Existing PASRR number confirmed on       FL2 transmitted to all facilities in geographic area requested by pt/family on 06/16/15     FL2 transmitted to all facilities within larger geographic area on 06/16/15     Patient informed that his/her managed care company has contracts with or will negotiate with certain facilities, including the following:        Yes   Patient/family informed of bed offers received.  Patient chooses bed at [facility]     Physician recommends and patient chooses bed at      Patient to be transferred to  [facility] on 06/18/15.  Patient to be transferred to facility by [facility]     Patient family notified on 06/18/15 of transfer.  Name of family member notified:  patient's sister via phone     PHYSICIAN       Additional Comment:    _______________________________________________ Standley Brooking, LCSW 06/18/2015, 11:55 AM

## 2015-06-18 NOTE — Consult Note (Signed)
WOC ostomy consult note Stoma type/location: LLQ Colostomy Stomal assessment/size: seen through pouching system, 1 and 3/4 inch slightly oval (approximate) Peristomal assessment: not seen today Treatment options for stomal/peristomal skin: skin barrier ring Output Brown stool Ostomy pouching: 2pc. 2 and 3/4 inch pouching system with skin barrier ring Education provided: Patient is beginning to learn to learn to empty pouch.  Is still very emotional, although is confident that Rehab facility is the best place for her at this time. Enrolled patient in [place] program: Yes  [place] wound follow up (Assisted by Chancy Hurter and Erline Hau) Wound type:Surgical Measurement:per wednesday Wound bed:red, moist Drainage (amount, consistency, odor) serous with minor bleeding (stops with pressure after 2 minutes) Periwound:intact, dry Dressing procedure/placement/frequency: Old dressing removed and wound assessed. Wound cleansed with NS, patted dry.  NPWT applied using 4 pieces of black foam. Secured with drape.  # and type of foam used is written on a label and affixed to [facility] pad tubing. Rehab facility will attach to [place] unit upon arrival to their facility today. Next dressing change is on Monday, July 18th.  Patient is ready for immediate transfer to Rehab facility.  Thanks, Maudie Flakes, MSN, RN, [place], [place], [place] [phone])

## 2015-06-18 NOTE — Progress Notes (Signed)
Chaplain visited patient this morning.  Chaplain provided a listening presence. Patient asked for prayer. Chaplain prayed a prayer of comfort and asked for peace for the patient as she is going to be discharged and is afraid of going into the unknown. Chaplain told patient that it is ok to cry. Patient begin to get calm.   Chaplain services are available as needed.   06/18/15 1100  Clinical Encounter Type  Visited With Patient  Visit Type Follow-up;Spiritual support;Social support  Referral From Care management

## 2015-06-18 NOTE — Progress Notes (Signed)
Patient ID: Deanna Aguilar, female   DOB: 11/28/1956, 59 y.o.   MRN: [id number]     [facility]      [address] [place], [place] [address]    Phone: [phone] FAX: [phone]     Subjective: Mood is rather labile, but overall better.  No n/v.  Tolerating POs.  VSS.  Afebrile.   Objective:  Vital signs:  Filed Vitals:   06/17/15 0545 06/17/15 1434 06/17/15 2210 06/18/15 0539  BP: 149/78 181/76 157/72 136/70  Pulse: 56 72 80 80  Temp: 98.1 F (36.7 C) 99.3 F (37.4 C) 98.5 F (36.9 C) 98.6 F (37 C)  TempSrc: Oral Oral Oral Oral  Resp: 18 18 18 20   Height:      Weight:      SpO2: 100% 100% 100% 100%    Last BM Date: 06/17/15  Intake/Output   Yesterday:  07/14 0701 - 07/15 0700 In: -  Out: 840 [Urine:715; Stool:125] This shift:    I/O last 3 completed shifts: In: 1800 [I.V.:1800] Out: 3267 [Urine:1015; Drains:65; Stool:125]    Physical Exam: General: Pt awake/alert/oriented x4 in no acute distress Abdomen: Soft. Nondistended. Mildly tender at incisions only. VAC with serous output, no bleeding.  Ostomy is functioning.     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   Atrial fibrillation with RVR   Colostomy in place    Results:   Labs: Results for orders placed or performed during the hospital encounter of 06/07/15 (from the past 48 hour(s))  CBC     Status: Abnormal   Collection Time: 06/16/15  8:15 AM  Result Value Ref Range   WBC 11.5 (H) 4.0 - 10.5 K/uL   RBC 2.93 (L) 3.87 - 5.11 MIL/uL   Hemoglobin 8.6 (L) 12.0 - 15.0 g/dL   HCT 26.6 (L) 36.0 - 46.0 %   MCV 90.8 78.0 - 100.0 fL   MCH 29.4 26.0 - 34.0 pg   MCHC 32.3 30.0 - 36.0 g/dL   RDW 15.3 11.5 - 15.5 %    Platelets 409 (H) 150 - 400 K/uL  Basic metabolic panel     Status: Abnormal   Collection Time: 06/16/15  8:15 AM  Result Value Ref Range   Sodium 142 135 - 145 mmol/L   Potassium 3.5 3.5 - 5.1 mmol/L   Chloride 115 (H) 101 - 111 mmol/L   CO2 22 22 - 32 mmol/L   Glucose, Bld 93 65 - 99 mg/dL   BUN 10 6 - 20 mg/dL   Creatinine, Ser 0.75 0.44 - 1.00 mg/dL   Calcium 7.6 (L) 8.9 - 10.3 mg/dL   GFR calc non Af Amer >60 >60 mL/min   GFR calc Af Amer >60 >60 mL/min    Comment: (NOTE) The eGFR has been calculated using the CKD EPI equation. This calculation has not been validated in all clinical situations. eGFR's persistently <60 mL/min signify possible Chronic Kidney Disease.    Anion gap 5 5 - 15  CBC     Status: Abnormal   Collection Time: 06/17/15  4:17 AM  Result Value Ref Range   WBC 10.1 4.0 - 10.5 K/uL   RBC 2.74 (L) 3.87 - 5.11 MIL/uL   Hemoglobin  8.0 (L) 12.0 - 15.0 g/dL   HCT 24.4 (L) 36.0 - 46.0 %   MCV 89.1 78.0 - 100.0 fL   MCH 29.2 26.0 - 34.0 pg   MCHC 32.8 30.0 - 36.0 g/dL   RDW 15.1 11.5 - 15.5 %   Platelets 397 150 - 400 K/uL  Basic metabolic panel     Status: Abnormal   Collection Time: 06/17/15  4:17 AM  Result Value Ref Range   Sodium 144 135 - 145 mmol/L   Potassium 3.3 (L) 3.5 - 5.1 mmol/L   Chloride 116 (H) 101 - 111 mmol/L   CO2 21 (L) 22 - 32 mmol/L   Glucose, Bld 86 65 - 99 mg/dL   BUN 8 6 - 20 mg/dL   Creatinine, Ser 0.81 0.44 - 1.00 mg/dL   Calcium 7.5 (L) 8.9 - 10.3 mg/dL   GFR calc non Af Amer >60 >60 mL/min   GFR calc Af Amer >60 >60 mL/min    Comment: (NOTE) The eGFR has been calculated using the CKD EPI equation. This calculation has not been validated in all clinical situations. eGFR's persistently <60 mL/min signify possible Chronic Kidney Disease.    Anion gap 7 5 - 15  CBC     Status: Abnormal   Collection Time: 06/18/15  5:10 AM  Result Value Ref Range   WBC 9.1 4.0 - 10.5 K/uL   RBC 2.78 (L) 3.87 - 5.11 MIL/uL   Hemoglobin  8.0 (L) 12.0 - 15.0 g/dL   HCT 25.0 (L) 36.0 - 46.0 %   MCV 89.9 78.0 - 100.0 fL   MCH 28.8 26.0 - 34.0 pg   MCHC 32.0 30.0 - 36.0 g/dL   RDW 15.1 11.5 - 15.5 %   Platelets 434 (H) 150 - 400 K/uL  Basic metabolic panel     Status: Abnormal   Collection Time: 06/18/15  5:10 AM  Result Value Ref Range   Sodium 141 135 - 145 mmol/L   Potassium 3.6 3.5 - 5.1 mmol/L   Chloride 114 (H) 101 - 111 mmol/L   CO2 23 22 - 32 mmol/L   Glucose, Bld 101 (H) 65 - 99 mg/dL   BUN 6 6 - 20 mg/dL   Creatinine, Ser 0.78 0.44 - 1.00 mg/dL   Calcium 7.7 (L) 8.9 - 10.3 mg/dL   GFR calc non Af Amer >60 >60 mL/min   GFR calc Af Amer >60 >60 mL/min    Comment: (NOTE) The eGFR has been calculated using the CKD EPI equation. This calculation has not been validated in all clinical situations. eGFR's persistently <60 mL/min signify possible Chronic Kidney Disease.    Anion gap 4 (L) 5 - 15    Imaging / Studies: No results found.  Medications / Allergies:  Scheduled Meds: . acetaminophen  1,000 mg Oral TID  . amiodarone  200 mg Oral BID  . antiseptic oral rinse  7 mL Mouth Rinse BID  . apixaban  5 mg Oral BID  . feeding supplement (PRO-STAT SUGAR FREE 64)  30 mL Oral Daily  . feeding supplement  1 Container Oral BID BM  . lip balm  1 application Topical BID  . methocarbamol  750 mg Oral TID  . metoprolol  5 mg Intravenous Once  . metoprolol tartrate  25 mg Oral BID  . polyethylene glycol  17 g Oral Daily  . saccharomyces boulardii  250 mg Oral BID  . sodium chloride  3 mL Intravenous Q12H   Continuous  Infusions: . sodium chloride 150 mL/hr at 06/17/15 0130   PRN Meds:.sodium chloride, alum & mag hydroxide-simeth, diphenhydrAMINE, HYDROmorphone (DILAUDID) injection, magic mouthwash, menthol-cetylpyridinium, metoCLOPramide (REGLAN) injection, metoCLOPramide, ondansetron **OR** ondansetron (ZOFRAN) IV, oxyCODONE, phenol, promethazine, sodium chloride  Antibiotics: Anti-infectives    Start      Dose/Rate Route Frequency Ordered Stop   06/08/15 0615  ertapenem (INVANZ) 1 g in sodium chloride 0.9 % 50 mL IVPB     1 g 100 mL/hr over 30 Minutes Intravenous Daily 06/08/15 0609 06/16/15 1017   06/07/15 2359  ciprofloxacin (CIPRO) IVPB 400 mg  Status:  Discontinued     400 mg 200 mL/hr over 60 Minutes Intravenous Every 12 hours 06/07/15 1524 06/08/15 0609   06/07/15 1500  metroNIDAZOLE (FLAGYL) IVPB 500 mg  Status:  Discontinued     500 mg 100 mL/hr over 60 Minutes Intravenous Every 8 hours 06/07/15 1459 06/08/15 0609   06/07/15 1300  ciprofloxacin (CIPRO) IVPB 400 mg     400 mg 200 mL/hr over 60 Minutes Intravenous  Once 06/07/15 1258 06/07/15 1422   06/07/15 1300  metroNIDAZOLE (FLAGYL) IVPB 500 mg     500 mg 100 mL/hr over 60 Minutes Intravenous  Once 06/07/15 1258 06/07/15 1422       Assessment/Plan Ruptured diverticulitis with peritonitis POD#9 exploratory laparotomy, sigmoid colectomy, takedown of splenic flexure, end colostomy---Dr. Dalbert Batman -stable, tolerating POs, afebrile and having ostomy function. No further bleeding from wound. -ostomy teaching -needs to mobilize and sit up in chair!! -IS -continue VAC AF with RVR-on [place] Renal failure ABL anemia-H&H are stable on Eliquis.   VTE prophylaxis-Eliquis.  FEN-no issues Dispo-stable for DC from surgical standpoint.  I will arrange her follow up.   Erby Pian, [facility] Pager [phone](7A-4:30P)   06/18/2015 8:02 AM

## 2015-06-18 NOTE — Progress Notes (Signed)
Physical Therapy Treatment Patient Details Name: Deanna Aguilar MRN: [id number] DOB: 1956-08-03 Today's Date: 06/18/2015    History of Present Illness 59 y.o. female admitted with abdominal pain. Dx of diverticulitits,  s/p colectomy 06/09/15.     PT Comments    Pt progressing slowly with mobility. Walked in hallway on today. Pain rated 10/10 with activity.   Follow Up Recommendations  SNF     Equipment Recommendations  Rolling walker with 5" wheels    Recommendations for Other Services       Precautions / Restrictions Precautions Precautions: Fall Precaution Comments: ostomy, wound vac, abdominal incision Restrictions Weight Bearing Restrictions: No    Mobility  Bed Mobility Overal bed mobility: Needs Assistance Bed Mobility: Rolling;Sit to Sidelying Rolling: Min assist       Sit to sidelying: Mod assist General bed mobility comments: instructed pt in log roll, verbal cues for technique, mod A for BLEs back into bed.   Transfers Overall transfer level: Needs assistance Equipment used: Rolling walker (2 wheeled) Transfers: Sit to/from Stand           General transfer comment: x2. Assist to rise, stabilize, control descent. VCs safety, hand placement  Ambulation/Gait Ambulation/Gait assistance: Min assist Ambulation Distance (Feet): 100 Feet Assistive device: Rolling walker (2 wheeled) Gait Pattern/deviations: Step-through pattern;Wide base of support;Decreased stride length;Trunk flexed     General Gait Details: assist to stabilize. multiple brief standing rest breaks needed/taken. Dyspnea 2-3.    Stairs            Wheelchair Mobility    Modified Rankin (Stroke Patients Only)       Balance           Standing balance support: Bilateral upper extremity supported;During functional activity Standing balance-Leahy Scale: Poor                      Cognition Arousal/Alertness: Awake/alert Behavior During Therapy: WFL for tasks  assessed/performed Overall Cognitive Status: Within Functional Limits for tasks assessed                      Exercises      General Comments        Pertinent Vitals/Pain Pain Assessment: 0-10 Pain Score: 10-Worst pain ever Pain Location: abdomen Pain Descriptors / Indicators: Sore Pain Intervention(s): Monitored during session;Limited activity within patient's tolerance;Repositioned    Home Living                      Prior Function            PT Goals (current goals can now be found in the care plan section) Progress towards PT goals: Progressing toward goals    Frequency  Min 3X/week    PT Plan Current plan remains appropriate    Co-evaluation             End of Session   Activity Tolerance: Patient limited by fatigue;Patient limited by pain Patient left: in bed;with call bell/phone within reach     Time: 1041-1110 PT Time Calculation (min) (ACUTE ONLY): 29 min  Charges:  $Gait Training: 8-22 mins $Therapeutic Activity: 8-22 mins                    G Codes:      Weston Anna, MPT Pager: [phone]

## 2015-06-18 NOTE — Progress Notes (Signed)
Office is going to work on appointment time with Dr. Dalbert Batman and call.

## 2015-06-18 NOTE — Discharge Summary (Signed)
Physician Discharge Summary  Deanna Aguilar [id number] DOB: 1956-11-17 DOA: 06/07/2015  PCP: [name], MD  Admit date: 06/07/2015 Discharge date: 06/18/2015  Time spent: 45 minutes  Recommendations for Outpatient Follow-up:  Patient will be discharged to [facility].  She will need to continue physical and occupational health is recommended by the facility.  Patient will need to follow up with primary care provider within one week of discharge or physician at the nursing facility.  Patient should have repeat CBC in one week. Patient will also need follow-up with [facility] surgery, this appointment will be arranged for the patient.  Patient will also need to follow up with Cardiology in 1 month.  Patient should continue medications as prescribed.  Patient should follow a heart healthy diet.   Discharge Diagnoses:  Sepsis secondary to ruptured diverticulitis with peritonitis Atrial fibrillation with RVR Acute kidney failure Depression Acute blood loss anemia  Discharge Condition: Stable  Diet recommendation: Heart healthy  Filed Weights   06/07/15 1615 06/11/15 0600 06/12/15 0400  Weight: 121.1 kg (266 lb 15.6 oz) 131.4 kg (289 lb 11 oz) 133.9 kg (295 lb 3.1 oz)    History of present illness:  on 06/07/2015 by Dr. [name] Deanna Aguilar is a 59 y.o. female with hypertension who presents with abdominal pain for 2 days. She points to the suprapubic area and states that it radiates from there to the right and left upper abdomen into her back. She feels pain when she tries to urinate as well. Pain quite severe. She has had nausea but no vomiting. She has had no appetite. She's felt hot and cold but has not checked her temperature. He has been taking Aleve and BC powders help control pain. Dilaudid given in the ER has helped her pain.  Hospital Course:  Sepsis secondary to ruptured diverticulitis with peritonitis -Patient did have hypotension as well as  leukocytosis which have both improved -S/p exploratory laparotomy, sigmoid colectomy, takedown flexure and end colostomy -Currently tolerating diet -Ostomy appears to be functioning well -Encouraged patient to get out of bed and mobilize -VAC to be resumed -Patient received 8 days of Invanz, discontinued 7/13 by surgery -PT consulted and recommended SNF -Continue pain control -Patient will need follow-up with general surgery, appointment will be arranged for by their office.  Atrial fibrillation with RVR -Currently on amiodarone 200mg  BID, metoprolol 25mg  BID -Cardiology consulted and appreciated -Patient started on full dose lovenox 7/14, no evidence of bleeding (although slight drop in Hb) -Transitioned patient to Eliquis (discussed with surgery)  Acute kidney failure -Resolved -Patient was taking NSAIDs as well as ACE inhibitor -Creatinine currently 0.78  Depression -Possibly situational -Psychiatry consulted and appreciate, recommended outpatient follow-up -Chaplain has been consulted  Acute blood loss Anemia -Baseline hemoglobin appears to be 9, currently a 8.0 -Patient was started on Eliquis and hemoglobin currently stable.  Would recommend repeat CBC in 1 week.   Procedures  Exploratory laparotomy, sigmoid colectomy, takedown flexure and end colostomy  Consults  Cardiology General surgery  Psychiatry  Discharge Exam: Filed Vitals:   06/18/15 0932  BP:   Pulse: 83  Temp:   Resp:    Exam  General: Well developed, well nourished, no distress  HEENT: NCAT, mucous membranes moist.   Cardiovascular: S1 S2 auscultated, no rubs, murmurs or gallops. Regular rate and rhythm.  Respiratory: Clear to auscultation bilaterally with equal chest rise  Abdomen: Soft, obese, incisional tenderness, nondistended, + bowel sounds, ostomy, VAC in place with  no blood noted  Extremities: warm dry without cyanosis clubbing or edema  Neuro: AAOx3, nonfocal  Psych:  Tearful, flat affect  Discharge Instructions      Discharge Instructions    Discharge instructions    Complete by:  As directed   Patient will be discharged to [facility] facility.  She will need to continue physical and occupational health is recommended by the facility.  Patient will need to follow up with primary care provider within one week of discharge or physician at the nursing facility.  Patient should have repeat CBC in one week. Patient will also need follow-up with [facility] surgery, this appointment will be arranged for the patient.  Patient will also need to follow up with Cardiology in 1 month.  Patient should continue medications as prescribed.  Patient should follow a heart healthy diet.            Medication List    STOP taking these medications        ALEVE 220 MG tablet  Generic drug:  naproxen sodium     Aspirin-Acetaminophen-Caffeine 520-260-32.5 MG Pack     Aspirin-Caffeine 845-65 MG Pack     losartan-hydrochlorothiazide 100-12.5 MG per tablet  Commonly known as:  HYZAAR      TAKE these medications        amiodarone 200 MG tablet  Commonly known as:  PACERONE  Take 1 tablet (200 mg total) by mouth 2 (two) times daily.     apixaban 5 MG Tabs tablet  Commonly known as:  ELIQUIS  Take 1 tablet (5 mg total) by mouth 2 (two) times daily.     feeding supplement (PRO-STAT SUGAR FREE 64) Liqd  Take 30 mLs by mouth daily.     feeding supplement Liqd  Take 1 Container by mouth 2 (two) times daily between meals.     metoCLOPramide 5 MG tablet  Commonly known as:  REGLAN  Take 1-2 tablets (5-10 mg total) by mouth every 6 (six) hours as needed for refractory nausea / vomiting (for breakthrough nausea).     metoprolol tartrate 25 MG tablet  Commonly known as:  LOPRESSOR  Take 1 tablet (25 mg total) by mouth 2 (two) times daily.     Oxycodone HCl 10 MG Tabs  Take 1 tablet (10 mg total) by mouth every 4 (four) hours as needed for moderate pain,  severe pain or breakthrough pain.     saccharomyces boulardii 250 MG capsule  Commonly known as:  FLORASTOR  Take 1 capsule (250 mg total) by mouth 2 (two) times daily.       No Known Allergies Follow-up Information    Follow up with [name], MD. Schedule an appointment as soon as possible for a visit in 1 week.   Specialty:  Family Medicine   Why:  Hospital follow up   Contact information:   [place] [address] [phone]       Follow up with [facility].   Why:  Office will arrange appointment   Contact information:   [address] [phone]       Follow up with Candee Furbish, MD. Schedule an appointment as soon as possible for a visit in 1 month.   Specialty:  Cardiology   Why:  Hospital follow up, Atrial fibrillation   Contact information:   [id number] [place] [address] [phone]  The results of significant diagnostics from this hospitalization (including imaging, microbiology, ancillary and laboratory) are listed below for reference.    Significant Diagnostic Studies: Ct Abdomen Pelvis W Contrast  06/07/2015   ADDENDUM REPORT: 06/07/2015 13:56  ADDENDUM: The urinary bladder is decompressed and difficult to exclude urinary bladder wall thickening or inflammation.   Electronically Signed   By: Markus Daft M.D.   On: 06/07/2015 13:56   06/07/2015   CLINICAL DATA:  Bilateral lower back pain.  EXAM: CT ABDOMEN AND PELVIS WITH CONTRAST  TECHNIQUE: Multidetector CT imaging of the abdomen and pelvis was performed using the standard protocol following bolus administration of intravenous contrast.  CONTRAST:  15mL OMNIPAQUE IOHEXOL 300 MG/ML  SOLN  COMPARISON:  None.  FINDINGS: There is bibasilar atelectasis, right side greater the left. There is free intraperitoneal air throughout the upper abdomen. Pockets of free air in the lower  abdomen. There is a small amount of perihepatic fluid. Normal appearance of the liver and gallbladder. Portal venous system is patent. Normal appearance of the pancreas, spleen and adrenal glands. Normal appearance of both kidneys.  Normal appearance of the stomach and duodenum. There is oral contrast in the distal small bowel and right colon. There is a markedly thickened loop of small bowel in the anterior lower abdomen on sequence 2, image 84. The distal small bowel and the right colon are distended. No significant wall thickening involving the terminal ileum. There is a small amount of fluid in the right lower quadrant of the abdomen. Mild pericolonic inflammatory changes involving the distal descending colon and there are diverticula involving the descending colon and sigmoid colon. There is focal edema or fluid between the sigmoid colon and the thickened loop of small bowel on sequence 2, image 81. No discrete abscess collection.  Small amount of free fluid in the pelvis. No gross abnormality to the uterus or adnexa tissue. Small amount of free fluid along the anterior lower abdomen adjacent to the thickened loop of small bowel. Patient has a periumbilical hernia with mild mesenteric edema.  There is no significant lymphadenopathy in the abdomen or pelvis. However, there is concern for mild lymphadenopathy in the hilar regions bilaterally.  Facet arthropathy in the lower lumbar spine. No acute bone abnormality.  IMPRESSION: Study is positive for pneumoperitoneum. There is fluid and inflammatory changes throughout the lower abdomen and upper pelvis. A short segment of small bowel has wall thickening and compatible with enteritis. This loop of small bowel is adjacent to the sigmoid colon and there are inflammatory changes adjacent to the distal descending colon and sigmoid colon. Findings raise concern for underlying diverticulitis. These inflammatory changes could represent a perforated diverticulitis with  secondary small bowel enteritis. However, a primary small bowel enteritis cannot be excluded. In addition, there is dilatation of the distal small bowel and right colon which could be related to an ileus.  Concern for mild chest lymphadenopathy in the hilar regions. This could be further characterized with a post contrast chest CT.  These results were called by telephone at the time of interpretation on 06/07/2015 at 1:07 pm to Dr. Virgel Manifold , who verbally acknowledged these results.  Electronically Signed: By: Markus Daft M.D. On: 06/07/2015 13:15    Microbiology: No results found for this or any previous visit (from the past 240 hour(s)).   Labs: Basic Metabolic Panel:  Recent Labs Lab 06/12/15 0417 06/16/15 0815 06/17/15 0417 06/18/15 0510  NA 144 142 144 141  K 4.0 3.5  3.3* 3.6  CL 116* 115* 116* 114*  CO2 22 22 21* 23  GLUCOSE 113* 93 86 101*  BUN 28* 10 8 6   CREATININE 1.16* 0.75 0.81 0.78  CALCIUM 8.3* 7.6* 7.5* 7.7*   Liver Function Tests: No results for input(s): AST, ALT, ALKPHOS, BILITOT, PROT, ALBUMIN in the last 168 hours. No results for input(s): LIPASE, AMYLASE in the last 168 hours. No results for input(s): AMMONIA in the last 168 hours. CBC:  Recent Labs Lab 06/12/15 0417 06/14/15 0924 06/16/15 0815 06/17/15 0417 06/18/15 0510  WBC 12.4* 11.5* 11.5* 10.1 9.1  HGB 9.9* 9.8* 8.6* 8.0* 8.0*  HCT 31.2* 29.7* 26.6* 24.4* 25.0*  MCV 90.7 90.5 90.8 89.1 89.9  PLT 489* 466* 409* 397 434*   Cardiac Enzymes: No results for input(s): CKTOTAL, CKMB, CKMBINDEX, TROPONINI in the last 168 hours. BNP: BNP (last 3 results) No results for input(s): BNP in the last 8760 hours.  ProBNP (last 3 results) No results for input(s): PROBNP in the last 8760 hours.  CBG: No results for input(s): GLUCAP in the last 168 hours.     SignedCristal Ford  [facility] 06/18/2015, 11:47 AM

## 2015-06-22 ENCOUNTER — Non-Acute Institutional Stay (SKILLED_NURSING_FACILITY): Payer: Medicaid Other | Admitting: Internal Medicine

## 2015-06-22 DIAGNOSIS — K913 Postprocedural intestinal obstruction: Secondary | ICD-10-CM | POA: Diagnosis not present

## 2015-06-22 DIAGNOSIS — K5732 Diverticulitis of large intestine without perforation or abscess without bleeding: Secondary | ICD-10-CM | POA: Diagnosis not present

## 2015-06-22 DIAGNOSIS — G43009 Migraine without aura, not intractable, without status migrainosus: Secondary | ICD-10-CM

## 2015-06-22 DIAGNOSIS — K9189 Other postprocedural complications and disorders of digestive system: Secondary | ICD-10-CM

## 2015-06-22 DIAGNOSIS — T8189XD Other complications of procedures, not elsewhere classified, subsequent encounter: Secondary | ICD-10-CM | POA: Diagnosis not present

## 2015-06-22 DIAGNOSIS — I10 Essential (primary) hypertension: Secondary | ICD-10-CM | POA: Diagnosis not present

## 2015-06-22 DIAGNOSIS — K567 Ileus, unspecified: Secondary | ICD-10-CM

## 2015-06-23 ENCOUNTER — Non-Acute Institutional Stay (SKILLED_NURSING_FACILITY): Payer: Medicaid Other | Admitting: Internal Medicine

## 2015-06-23 DIAGNOSIS — T8189XD Other complications of procedures, not elsewhere classified, subsequent encounter: Secondary | ICD-10-CM | POA: Diagnosis not present

## 2015-06-23 DIAGNOSIS — K913 Postprocedural intestinal obstruction: Secondary | ICD-10-CM

## 2015-06-23 DIAGNOSIS — I10 Essential (primary) hypertension: Secondary | ICD-10-CM | POA: Diagnosis not present

## 2015-06-23 DIAGNOSIS — K9189 Other postprocedural complications and disorders of digestive system: Secondary | ICD-10-CM

## 2015-06-23 DIAGNOSIS — K567 Ileus, unspecified: Secondary | ICD-10-CM

## 2015-06-23 NOTE — Progress Notes (Addendum)
Patient ID: Deanna Aguilar, female   DOB: 1956/02/03, 59 y.o.   MRN: [id number]                HISTORY & PHYSICAL  DATE:  06/21/2015          FACILITY: [facility]                    LEVEL OF CARE:   SNF   CHIEF COMPLAINT:  Admission to the building, post stay at [facility], 06/07/2015 through 06/18/2015.        HISTORY OF PRESENT ILLNESS:  This patient was admitted acutely ill, presenting with abdominal pain for two days.  She required a laparotomy, a sigmoid colectomy, and was felt to have a ruptured sigmoid diverticula.  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  She was left with the original surgical wound to be closed with the assistance of a wound VAC.  She received eight days of Invanz, discontinued on 06/16/2015.    Other medical history in the hospital included AFib with rapid ventricular response, requiring amiodarone and metoprolol.  She was seen by Cardiology.  She was started on full-dose Lovenox on 06/17/2015 and then transitioned to Eliquis.    She had acute renal failure, which was felt to be secondary to NSAIDs as well as an ACE inhibitor.  Her creatinine at discharge was 0.78.    She was felt to be situationally depressed.  Psychiatry saw the patient and recommended no medications.    PAST MEDICAL HISTORY/PROBLEM LIST:           Hypertension.  The patient states that she was diagnosed with this a year ago.    CURRENT MEDICATIONS:  Medication list is reviewed.    Amiodarone 200 b.i.d.      Apixaban 5 mg b.i.d.      Reglan 1 tablet a.c. meals p.r.n. for refractory nausea/vomiting.    Metoprolol 25 b.i.d.      Oxycodone 10 mg every 4 hours as needed.    Florastor 250 b.i.d.      Medications that were stopped include:     Aleve 220 mg.    Aspirin/acetaminophen/caffeine 520/260/32.5.    ASA/caffeine.    Losartan/hydrochlorothiazide.    SOCIAL HISTORY:                   HOUSING:  The patient tells me she lived in "housing in [place]".  She was living  in [place] until about a year ago, moving down here as she was originally from [place].   TOBACCO USE:  Non-smoker.   ALCOHOL:  Non-drinker.    FAMILY HISTORY:   None of relevance.    REVIEW OF SYSTEMS:            HEENT:  States she has had a headache for two solid days.  Goes over her entire occiput.   CHEST/RESPIRATORY:  No shortness of breath.     CARDIAC:  No chest pain.    GI:  Feels distended and bloated.   GU:  No dysuria.    EDEMA/VARICOSITIES:  Extremities:  Feels edematous and tight.  She feels very uncomfortable.    PHYSICAL EXAMINATION:   VITAL SIGNS:     TEMPERATURE:  98.7.   PULSE:  72.    RESPIRATIONS:  18.     BLOOD PRESSURE:  142/78.    02 SATURATIONS:  95% on room air.   GENERAL APPEARANCE:  The patient is not in any  distress.      CHEST/RESPIRATORY:  Clear air entry bilaterally.     CARDIOVASCULAR:   CARDIAC:  Heart sounds are normal.  There are no murmurs.  No carotid bruits.    GASTROINTESTINAL:   ABDOMEN:  Distended, but bowel sounds are positive.  She has a colostomy in the left lower quadrant, which is empty.  There is no tenderness.   LIVER/SPLEEN/KIDNEY:   No liver, no spleen.  No tenderness.    GENITOURINARY:   BLADDER:  No suprapubic or costovertebral angle tenderness.     CIRCULATION:   EDEMA/VARICOSITIES:  Extremities:  1-2+ tight edema in her lower legs.  No evidence of a DVT.   NEUROLOGICAL:   SENSATION/STRENGTH:  She has antigravity strength in her lower legs.   DEEP TENDON REFLEXES:  Reflexes at the knees are maintained.   PSYCHIATRIC:   MENTAL STATUS:    She seems very anxious, somewhat despondent about being here.    ASSESSMENT/PLAN:         Status post sigmoid colectomy for a ruptured sigmoid diverticula with peritonitis.  Her abdomen seems non-tender.  Lab work will be repeated.    Non-surgical wound, non-healing.  This has a wound VAC in place.  I will look at this wound on Wednesday.  The VAC would seem appropriate from the  pictures that I have looked at.    I suspect this woman has had chronic headaches, ?migraines.  She was taking some combination of Indocin with Tylenol for headaches before she came to the hospital.  She tells me her headaches were made worse by white or red wine or certain cheeses.  She may have migraines.    Hypertension.  I was called to a report of a blood pressure of 220/105+ this morning.  I gave her clonidine.  Her blood pressure now most recently is in the 140 range.  She was on Lotensin/hydrochlorothiazide, I think, before this admission, which was stopped due to renal insufficiency.  Her BUN and creatinine normalized.      ?Early ileus.  Her abdomen is very distended.  However, her bowel sounds are positive.  I am going to put her on routine MiraLAX.  Putting her on the Reglan routinely may also be beneficial, although I would not want to do this long-term.    Widespread edema that is mild.  I am going to put her back on a thiazide-based diuretic, as well.  If this does not work, perhaps a low dose of Lasix.      Situational depression/anxiety: I think that psychiatrist who saw her in the hospitla felt the same way. No additional medications are indicated

## 2015-06-28 ENCOUNTER — Other Ambulatory Visit: Payer: Self-pay | Admitting: *Deleted

## 2015-06-28 MED ORDER — OXYCODONE HCL 10 MG PO TABS: ORAL_TABLET | ORAL | Status: DC

## 2015-06-28 NOTE — Progress Notes (Addendum)
Patient ID: Deanna Aguilar, female   DOB: [birth year], 59 y.o.   MRN: [id number]                PROGRESS NOTE  DATE:  06/23/2015        FACILITY: [facility]                      LEVEL OF CARE:   SNF   Acute Visit                CHIEF COMPLAINT:  Follow up admission issues.    HISTORY OF PRESENT ILLNESS:  This is a pleasant woman who presented to hospital acutely ill with peritonitis.  She had a laparotomy, a sigmoid colectomy secondary to a ruptured sigmoid diverticula.  She had an end colostomy placed.    When I saw her for her admission two days ago, I noted uncontrolled blood pressure at [id number] systolic, headache, a surgical wound on the abdomen with a wound VAC in place.  I was also concerned about an early ileus.  I put her on routine MiraLAX.     With regards to her high blood pressure, I increased her metoprolol to 50 b.i.d. from 25, and added hydrochlorothiazide.  She is feeling a lot better.  No headaches.  Blood pressure is under better control today at 134/76.  She states her edema is a lot better, as well.  Weight today is 298 pounds, down from 303 pounds on admission on 06/19/2015.    REVIEW OF SYSTEMS:    CHEST/RESPIRATORY:  No shortness of breath.   CARDIAC:  No chest pain.   GI:  She states her colostomy is moving well.   EDEMA/VARICOSITIES:  Extremities:  Less edema.     PHYSICAL EXAMINATION:   VITAL SIGNS:     TEMPERATURE:  98.5.     PULSE:  76.    RESPIRATIONS:  18.      BLOOD PRESSURE:  134/76.    02 SATURATIONS:  95% on room air.   GENERAL APPEARANCE:  The patient looks much calmer than the other day when she was really very anxious.   CHEST/RESPIRATORY:  Exam is clear.       CARDIOVASCULAR:   CARDIAC:  Heart sounds are normal.  No gallops are heard.      GASTROINTESTINAL:   ABDOMEN:  Obese, distended, but bowel sounds are positive.  I think she is much less distended than yesterday.  There is soft, formed stool in her ostomy bag.     LIVER/SPLEEN/KIDNEYS:  No liver, no spleen.   CIRCULATION:   EDEMA/VARICOSITIES:  Extremities:  Large legs, but with less edema present.      ASSESSMENT/PLAN:               Severe hypertension.  I think a lot of this probably was extreme anxiety.  Nevertheless, her blood pressure is a lot better.  She seems to be tolerating the metoprolol increase well, currently with a pulse rate in the mid 70s.  The hydrochlorothiazide has helped with her edema.    Concerns about abdominal ileus.  Today, her abdomen is less distended.  Bowel sounds are positive.  My level of concern about this has gone down.    Non-healing surgical wound.  This looks very healthy under the wound VAC, although it was painful to change.  She has good healthy granulation tissue.  No additional concerns here.

## 2015-06-28 NOTE — Telephone Encounter (Signed)
[place]

## 2015-07-21 ENCOUNTER — Non-Acute Institutional Stay (SKILLED_NURSING_FACILITY): Payer: Medicaid Other | Admitting: Internal Medicine

## 2015-07-21 DIAGNOSIS — I480 Paroxysmal atrial fibrillation: Secondary | ICD-10-CM | POA: Diagnosis not present

## 2015-07-21 DIAGNOSIS — T8189XD Other complications of procedures, not elsewhere classified, subsequent encounter: Secondary | ICD-10-CM | POA: Diagnosis not present

## 2015-07-21 DIAGNOSIS — I10 Essential (primary) hypertension: Secondary | ICD-10-CM

## 2015-07-26 ENCOUNTER — Other Ambulatory Visit: Payer: Self-pay | Admitting: *Deleted

## 2015-07-26 MED ORDER — LORAZEPAM 1 MG PO TABS: ORAL_TABLET | ORAL | Status: DC

## 2015-07-26 NOTE — Telephone Encounter (Signed)
[facility] 

## 2015-07-28 NOTE — Progress Notes (Addendum)
Patient ID: Deanna Aguilar, female   DOB: 1956-08-05, 59 y.o.   MRN: [id number]                PROGRESS NOTE  DATE:  07/21/2015         FACILITY: [facility]                        LEVEL OF CARE:   SNF   Acute Visit                              CHIEF COMPLAINT:  Review of medical issues with discharge planned for next week.     HISTORY OF PRESENT ILLNESS:  This patient came to [place] after having an acute abdomen.  She required a laparotomy for a ruptured sigmoid diverticula.  She had a sigmoid colectomy.  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  She was left with the original surgical wound to be closed with the assistance of a wound VAC.    Issues since she came in here included recently severe hypertension with systolic blood pressures in the 220s, lower extremity edema.  All of this seems to have responded well to the addition of hydrochlorothiazide and Lopressor 50 p.o. b.i.d.  Her post operative AFIB seems to be stable  She is currently receiving Hydrogel wet-to-dry dressings to the original surgical wound in her abdomen.  The VAC is no longer necessary.    Past Medical History  Diagnosis Date  . Hypertension    Past Surgical History  Procedure Laterality Date  . Tubal ligation    . Colon resection N/A 06/09/2015    Procedure: SIGMOID COLECTOMY, COLOSTOMY, TAKE DOWN SPLENIC FLECTURE;  Surgeon: Fanny Skates, MD;  Location: [facility] ORS;  Service: General;  Laterality: N/A;    CURRENT MEDICATIONS:  Medication list is reviewed.    Hydrochlorothiazide 25 q.d.      MiraLAX 17 g daily.     Amiodarone 200 b.i.d.      Eliquis 5 mg b.i.d.      Iron 325 p.o. b.i.d.      Lopressor 50 mg p.o. b.i.d.      Reglan p.r.n.      Naprosyn p.r.n.      Oxycodone p.r.n.       LABORATORY DATA:   Recent lab work on 07/10/2015:    Essentially normal CBC and differential.  Hemoglobin 11.2, white count 4.9.    Basic metabolic panel normal.    REVIEW OF SYSTEMS:    GENERAL:  The patient  states she feels well.  No issues.  No fever.  Appetite is good.   CHEST/RESPIRATORY:  No cough.  No sputum.     CARDIAC:  No chest pain.    GI:  No abdominal pain.  Colostomy is moving well.   Wound is healing nicely.   GU:  No dysuria.    EDEMA/VARICOSITIES:  No edema.   NEUROLOGICAL:  She is up walking with a cane.  I suspect this will transition to independent ambulation.     PHYSICAL EXAMINATION:   VITAL SIGNS:     TEMPERATURE:  98.5.     PULSE:  68 and regular RESPIRATIONS:  18.   BLOOD PRESSURE:  130/70.   GENERAL APPEARANCE:  The patient is not in any distress.   CHEST/RESPIRATORY:  Exam is clear.        CARDIOVASCULAR:   CARDIAC:  Heart sounds are normal.   GASTROINTESTINAL:   ABDOMEN:  Ostomy in the left lower quadrant.  Midline pelvic incision is healing nicely with advancing epithelialization.   LIVER/SPLEEN/KIDNEYS:  No liver, no spleen.  No tenderness.   GENITOURINARY:   BLADDER:  No suprapubic tenderness is noted.   CIRCULATION:   EDEMA/VARICOSITIES:  Extremities:  No edema.  No evidence of a DVT.     ASSESSMENT/PLAN:                  Surgical wound, nonhealing.  This is well on its way to healing.  She will be able to go home with Hydrogel wet-to-dry dressings.  This can be worked out through home health.    Ostomy.  She will need supplies going home and teaching for both the ostomy changes and the dressing changes although this could be worked through with home health, as well.    Hypertension.  This is under much better control.  Recent increase in her metoprolol seems to have helped.  Her pulse rate is tolerating this nicely, in the upper 60s.    Atrial Fib: She is now presumable in sinus on amiodarone and metoprolol. On Eliquis  Needs f/u with cardiology which I believe she has arranged

## 2015-08-13 ENCOUNTER — Encounter: Payer: Self-pay | Admitting: *Deleted

## 2015-08-13 ENCOUNTER — Telehealth: Payer: Self-pay | Admitting: *Deleted

## 2015-08-13 NOTE — Telephone Encounter (Signed)
called for fm hx, talked to pt and got.Marland KitchenMarland Kitchen

## 2015-08-18 ENCOUNTER — Encounter: Payer: Self-pay | Admitting: Cardiology

## 2015-08-18 ENCOUNTER — Ambulatory Visit (INDEPENDENT_AMBULATORY_CARE_PROVIDER_SITE_OTHER): Payer: Medicaid Other | Admitting: Cardiology

## 2015-08-18 VITALS — BP 130/88 | HR 68 | Ht 69.0 in | Wt 247.0 lb

## 2015-08-18 DIAGNOSIS — I480 Paroxysmal atrial fibrillation: Secondary | ICD-10-CM

## 2015-08-18 DIAGNOSIS — Z8719 Personal history of other diseases of the digestive system: Secondary | ICD-10-CM

## 2015-08-18 DIAGNOSIS — IMO0002 Reserved for concepts with insufficient information to code with codable children: Secondary | ICD-10-CM | POA: Insufficient documentation

## 2015-08-18 DIAGNOSIS — I10 Essential (primary) hypertension: Secondary | ICD-10-CM

## 2015-08-18 NOTE — Progress Notes (Signed)
Cardiology Office Note   Date:  08/18/2015   ID:  ADALYS PORTALES, DOB 1956/02/23, MRN [id number]  PCP:  [name], MD  Cardiologist:   Candee Furbish, MD       History of Present Illness: Deanna Aguilar is a 59 y.o. female who presents for evaluation of paroxysmal atrial fibrillation. Certainly the hospital on 06/12/15 where she converted with IV amiodarone from atrial fibrillation with rapid ventricular response. She was placed on amiodarone 200 mg twice a day. Plan was to discontinue it if she maintains normal rhythm. Anticoagulation was continued. This atrial fibrillation occurred in the setting of peritonitis (reversible cause)  She's been somewhat depressed after colostomy. Surgery.  She was asking about her medications. She will have no significant palpitations. She may get a twinge of minimal chest discomfort.    Past Medical History  Diagnosis Date  . Hypertension     Past Surgical History  Procedure Laterality Date  . Tubal ligation    . Colon resection N/A 06/09/2015    Procedure: SIGMOID COLECTOMY, COLOSTOMY, TAKE DOWN SPLENIC FLECTURE;  Surgeon: Fanny Skates, MD;  Location: [facility];  Service: General;  Laterality: N/A;     Current Outpatient Prescriptions  Medication Sig Dispense Refill  . amiodarone (PACERONE) 200 MG tablet Take 1 tablet (200 mg total) by mouth 2 (two) times daily. 60 tablet 0  . apixaban (ELIQUIS) 5 MG TABS tablet Take 1 tablet (5 mg total) by mouth 2 (two) times daily. 60 tablet 0  . hydrochlorothiazide (HYDRODIURIL) 25 MG tablet Take 25 mg by mouth daily.  0  . metoprolol tartrate (LOPRESSOR) 25 MG tablet 25 mg. Take 2 tablets by mouth twice daily     No current facility-administered medications for this visit.    Allergies:   Review of patient's allergies indicates no known allergies.    Social History:  The patient  reports that she has never smoked. She does not have any smokeless tobacco history on file. She reports that  she does not drink alcohol or use illicit drugs.   Family History:  The patient's family history includes Heart attack in her mother; Heart disease in her mother; Hypertension in her mother; Other in her maternal grandmother.    ROS:  Please see the history of present illness.   Otherwise, review of systems are positive for anxiety, depression, occasional nausea.   All other systems are reviewed and negative.    PHYSICAL EXAM: VS:  BP 130/88 mmHg  Pulse 68  Ht 5\' 9"  (1.753 m)  Wt 247 lb (112.038 kg)  BMI 36.46 kg/m2 , BMI Body mass index is 36.46 kg/(m^2). GEN: Well nourished, well developed, in no acute distress HEENT: normal Neck: no JVD, carotid bruits, or masses Cardiac: RRR; no murmurs, rubs, or gallops,no edema  Respiratory:  clear to auscultation bilaterally, normal work of breathing GI: soft, nontender, nondistended, + BS obese. Ostomy MS: no deformity or atrophy Skin: warm and dry, no rash Neuro:  Strength and sensation are intact Psych: euthymic mood, full affect   EKG:  EKG is ordered today. The ekg ordered today demonstrates normal sinus rhythm, no longer atrial fibrillation, personally viewed. Heart rate 58 bpm.   Recent Labs: 06/11/2015: Magnesium 2.6*; TSH 0.579 06/18/2015: BUN 6; Creatinine, Ser 0.78; Hemoglobin 8.0*; Platelets 434*; Potassium 3.6; Sodium 141    Lipid Panel No results found for: CHOL, TRIG, HDL, CHOLHDL, VLDL, LDLCALC, LDLDIRECT    Wt Readings from Last 3 Encounters:  08/18/15 247  lb (112.038 kg)  06/12/15 295 lb 3.1 oz (133.9 kg)      Other studies Reviewed: Additional studies/ records that were reviewed today include: Hospital notes reviewed,. Review of the above records demonstrates: as above  Echo: 06/10/15-normal ejection fraction 50-55%, mildly dilated atrial chambers.  ASSESSMENT AND PLAN:  1.  Paroxysmal atrial fibrillation in the setting of peritonitis  - She has been stable, no further evidence of atrial fibrillation. I will  discontinue amiodarone as well as Eliquis. CHADS-VASc 1-2 (female, HTN). She had a reversible cause. If atrial fibrillation were to return, we would resume anticoagulation. There is a risk of stroke if atrial fibrillation returns.  2. Obesity-continue to encourage weight loss.  3. Essential hypertension-currently on very low-dose metoprolol. Adequately controlled. Low-dose HCTZ as well.  4. Colostomy-per primary team  Current medicines are reviewed at length with the patient today.  The patient does not have concerns regarding medicines.  The following changes have been made:  As above  Labs/ tests ordered today include: No orders of the defined types were placed in this encounter.     Disposition:   FU with Kailia Starry  in 6 months  Signed, Candee Furbish, MD  08/18/2015 11:35 AM    [facility] [address], [place], [place]  [address] Phone: [phone]; Fax: [phone]

## 2015-08-18 NOTE — Patient Instructions (Signed)
Medication Instructions:  Please stop your Amiodarone and Eliquis. Continue all other medications as listed.  Follow-Up: Follow up in 6 months with Dr. Marlou Porch.  You will receive a letter in the mail 2 months before you are due.  Please call us when you receive this letter to schedule your follow up appointment.  Thank you for choosing [facility]!!

## 2016-01-10 ENCOUNTER — Telehealth: Payer: Self-pay | Admitting: Family Medicine

## 2016-01-10 NOTE — Telephone Encounter (Signed)
REMINDER CALL FOR APPT/PHONE IS DISCONNECTED

## 2016-01-11 ENCOUNTER — Encounter: Payer: Self-pay | Admitting: Internal Medicine

## 2016-01-11 ENCOUNTER — Ambulatory Visit (INDEPENDENT_AMBULATORY_CARE_PROVIDER_SITE_OTHER): Payer: Medicaid Other | Admitting: Internal Medicine

## 2016-01-11 VITALS — BP 164/78 | HR 69 | Temp 98.4°F | Ht 69.0 in | Wt 274.5 lb

## 2016-01-11 DIAGNOSIS — Z933 Colostomy status: Secondary | ICD-10-CM | POA: Diagnosis not present

## 2016-01-11 DIAGNOSIS — I480 Paroxysmal atrial fibrillation: Secondary | ICD-10-CM | POA: Diagnosis not present

## 2016-01-11 DIAGNOSIS — I10 Essential (primary) hypertension: Secondary | ICD-10-CM

## 2016-01-11 DIAGNOSIS — Z0000 Encounter for general adult medical examination without abnormal findings: Secondary | ICD-10-CM | POA: Insufficient documentation

## 2016-01-11 MED ORDER — METOPROLOL TARTRATE 25 MG PO TABS: 25.0000 mg | ORAL_TABLET | Freq: Two times a day (BID) | ORAL | Status: DC

## 2016-01-11 MED ORDER — HYDROCHLOROTHIAZIDE 25 MG PO TABS: 25.0000 mg | ORAL_TABLET | Freq: Every day | ORAL | Status: DC

## 2016-01-11 NOTE — Addendum Note (Signed)
Addended by: Bethena Roys on: 01/11/2016 04:57 PM   Modules accepted: Level of Service

## 2016-01-11 NOTE — Assessment & Plan Note (Signed)
Obtain records

## 2016-01-11 NOTE — Assessment & Plan Note (Addendum)
Patient has a colostomy in situ, for Colonic perforation from diverticulititis 06/2015. She requires supplies for her colostomy. Site looks fine without erythema. She will be establishing care with [place], as her last PCP passed away last month. Pt uses 1 bag a day, and say she gets 25 for the month, and so she runs out before the end of the month and washes and reuses them. She had skin breakdown around the stoma previously, and so she uses one bag per day.  Plan- refill Supplies for colostomy - [place] [phone], ext [phone], prescription was faxed to [place], signed and faxed back

## 2016-01-11 NOTE — Progress Notes (Signed)
Patient ID: Deanna Aguilar, female   DOB: Jul 06, 1956, 60 y.o.   MRN: [id number]   Subjective:   Patient ID: Deanna Aguilar female   DOB: [birth year] 60 y.o.   MRN: [id number]  HPI: [name] is a 60 y.o. with PMH of HTn, has a colostomy after bowel surgery for perforation,- July 2016  For diverticulitis/losis, obesity,and depression, paroxysmal atria fib. Presnted today as a new patient, her PCP passed a way last month. She wants to establish care here. She says she has run out of colostomy bags and supplies- since december.   Past Medical History  Diagnosis Date  . Hypertension    Current Outpatient Prescriptions  Medication Sig Dispense Refill  . hydrochlorothiazide (HYDRODIURIL) 25 MG tablet Take 25 mg by mouth daily.  0  . metoprolol tartrate (LOPRESSOR) 25 MG tablet 25 mg. Take 2 tablets by mouth twice daily     No current facility-administered medications for this visit.   Family History  Problem Relation Age of Onset  . Heart disease Mother   . Heart attack Mother   . Hypertension Mother   . Other Maternal Grandmother     ovarian issues   Social History   Social History  . Marital Status: Legally Separated    Spouse Name: N/A  . Number of Children: N/A  . Years of Education: N/A   Social History Main Topics  . Smoking status: Never Smoker   . Smokeless tobacco: None  . Alcohol Use: No  . Drug Use: No  . Sexual Activity: Not Asked   Other Topics Concern  . None   Social History Narrative   Review of Systems: CONSTITUTIONAL- No Fever, weightloss, night sweat or change in appetite. SKIN- No Rash, colour changes or itching. HEAD- No Headache or dizziness. Mouth/throat- No Sorethroat, dentures, or bleeding gums. RESPIRATORY- No Cough or SOB. CARDIAC- No Palpitations, or chest pain. GI- Colostomy bag, some bleeding around stoma per pt  URINARY- No Frequency, urgency, straining or dysuria. NEUROLOGIC- No Numbness, syncope, seizures or burning. [facility]-  Denies depression or anxiety.  Objective:  Physical Exam: Filed Vitals:   01/11/16 1414  BP: 164/78  Pulse: 69  Temp: 98.4 F (36.9 C)  TempSrc: Oral  Height: 5\' 9"  (1.753 m)  Weight: 274 lb 8 oz (124.512 kg)  SpO2: 100%   GENERAL- alert, co-operative, appears as stated age, not in any distress. HEENT- Atraumatic, normocephalic, PERRL, EOMI, oral mucosa appears moist CARDIAC- RRR, no murmurs, rubs or gallops. RESP- Moving equal volumes of air, and clear to auscultation bilaterally, no wheezes or crackles. ABDOMEN- Soft, nontender,no palpable masses or organomegaly, bowel sounds present, stoma with clean bag over stoma, no surrounding redness or erythema. NEURO- No obvious Cr N abnormality, strenght upper and lower extremities-Gait- Normal. EXTREMITIES- warm, well perfused, no pedal edema. SKIN- Warm, dry, No rash or lesion. PSYCH- Normal mood and affect, appropriate thought content and speech.  Assessment & Plan:   The patient's case and plan of care was discussed with attending physician, Dr. Dareen Piano  Please see problem based charting for assessment and plan.

## 2016-01-11 NOTE — Assessment & Plan Note (Addendum)
BP Readings from Last 3 Encounters:  01/11/16 164/78  08/18/15 130/88  06/18/15 136/70    Lab Results  Component Value Date   NA 141 06/18/2015   K 3.6 06/18/2015   CREATININE 0.78 06/18/2015    Assessment: Blood pressure control:  Elevated today, previously better controlled, pt under a lot of stress today and anxious Other plans: Cmet check today - refilled Metoprolol and HCTZ - CBC today also, as her last Hgb was low, likely due to colonic perforation and subsequent surgery - Obtain records from prior PCP. -

## 2016-01-11 NOTE — Assessment & Plan Note (Signed)
Pt has a hx of Atria fib in the setting or peritonitis and spontaneously converted while on amiodarone. Her Amiodarone and Eliquis were d/c. Her CHADvASC score was 1-2. She would be restarted on anticoag if she was to revert.

## 2016-01-12 LAB — CBC
HEMOGLOBIN: 13.1 g/dL (ref 11.1–15.9)
Hematocrit: 40.1 % (ref 34.0–46.6)
MCH: 30 pg (ref 26.6–33.0)
MCHC: 32.7 g/dL (ref 31.5–35.7)
MCV: 92 fL (ref 79–97)
Platelets: 318 10*3/uL (ref 150–379)
RBC: 4.36 x10E6/uL (ref 3.77–5.28)
RDW: 14.9 % (ref 12.3–15.4)
WBC: 9.1 10*3/uL (ref 3.4–10.8)

## 2016-01-12 LAB — CMP14 + ANION GAP
A/G RATIO: 1.2 (ref 1.1–2.5)
ALT: 7 IU/L (ref 0–32)
ANION GAP: 21 mmol/L — AB (ref 10.0–18.0)
AST: 7 IU/L (ref 0–40)
Albumin: 4 g/dL (ref 3.5–5.5)
Alkaline Phosphatase: 79 IU/L (ref 39–117)
BUN/Creatinine Ratio: 25 — ABNORMAL HIGH (ref 9–23)
BUN: 26 mg/dL — ABNORMAL HIGH (ref 6–24)
Bilirubin Total: 0.3 mg/dL (ref 0.0–1.2)
CO2: 19 mmol/L (ref 18–29)
Calcium: 9.1 mg/dL (ref 8.7–10.2)
Chloride: 102 mmol/L (ref 96–106)
Creatinine, Ser: 1.05 mg/dL — ABNORMAL HIGH (ref 0.57–1.00)
GFR calc Af Amer: 68 mL/min/{1.73_m2} (ref >59)
GFR calc non Af Amer: 59 mL/min/{1.73_m2} — ABNORMAL LOW (ref >59)
Globulin, Total: 3.4 g/dL (ref 1.5–4.5)
Glucose: 88 mg/dL (ref 65–99)
POTASSIUM: 4 mmol/L (ref 3.5–5.2)
Sodium: 142 mmol/L (ref 134–144)
Total Protein: 7.4 g/dL (ref 6.0–8.5)

## 2016-01-13 ENCOUNTER — Telehealth: Payer: Self-pay | Admitting: *Deleted

## 2016-01-13 NOTE — Telephone Encounter (Signed)
CALLED OFFICE / LEFT VOICE MESSAGE FOR OFFICE TO CALL BACK WITH A NUMBER TO FAXED RECORD RELEASE.  CALL BACK NUMBER [phone] OR [phone].

## 2016-01-17 NOTE — Progress Notes (Signed)
Internal Medicine Clinic Attending  Case discussed with Dr. Emokpae soon after the resident saw the patient.  We reviewed the resident's history and exam and pertinent patient test results.  I agree with the assessment, diagnosis, and plan of care documented in the resident's note. 

## 2016-01-19 ENCOUNTER — Other Ambulatory Visit: Payer: Self-pay | Admitting: Internal Medicine

## 2016-01-19 DIAGNOSIS — Z933 Colostomy status: Secondary | ICD-10-CM

## 2016-01-19 MED ORDER — OSTOMY DRAINABLE POUCH/FLANGE MISC: 1.0000 [IU] | Freq: Every day | Status: DC

## 2016-01-19 MED ORDER — ADHESIVE REMOVER WIPES MISC: 1.0000 [IU] | Freq: Every day | Status: DC

## 2016-01-19 MED ORDER — SECURI-T OSTOMY DEODORANT LIQD: 1.0000 [IU] | Freq: Every day | Status: DC

## 2016-01-19 MED ORDER — SKIN PREP WIPES MISC: 1.0000 [IU] | Freq: Every day | Status: DC

## 2016-02-18 ENCOUNTER — Telehealth: Payer: Self-pay | Admitting: Internal Medicine

## 2016-02-18 NOTE — Telephone Encounter (Signed)
APPT. REMINDER CALL, LMTCB °

## 2016-02-21 ENCOUNTER — Ambulatory Visit (INDEPENDENT_AMBULATORY_CARE_PROVIDER_SITE_OTHER): Payer: Medicaid Other | Admitting: Internal Medicine

## 2016-02-21 ENCOUNTER — Ambulatory Visit (HOSPITAL_COMMUNITY)
Admission: RE | Admit: 2016-02-21 | Discharge: 2016-02-21 | Disposition: A | Discharge status: Home / Self Care | Payer: Medicaid Other | Source: Ambulatory Visit | Attending: Internal Medicine | Admitting: Internal Medicine

## 2016-02-21 ENCOUNTER — Encounter: Payer: Self-pay | Admitting: Internal Medicine

## 2016-02-21 VITALS — BP 127/61 | HR 68 | Temp 98.1°F | Wt 284.0 lb

## 2016-02-21 DIAGNOSIS — Z933 Colostomy status: Secondary | ICD-10-CM

## 2016-02-21 DIAGNOSIS — I10 Essential (primary) hypertension: Secondary | ICD-10-CM | POA: Diagnosis not present

## 2016-02-21 DIAGNOSIS — G4700 Insomnia, unspecified: Secondary | ICD-10-CM | POA: Diagnosis not present

## 2016-02-21 DIAGNOSIS — F329 Major depressive disorder, single episode, unspecified: Secondary | ICD-10-CM

## 2016-02-21 DIAGNOSIS — I480 Paroxysmal atrial fibrillation: Secondary | ICD-10-CM | POA: Diagnosis not present

## 2016-02-21 DIAGNOSIS — M170 Bilateral primary osteoarthritis of knee: Secondary | ICD-10-CM | POA: Insufficient documentation

## 2016-02-21 DIAGNOSIS — M25562 Pain in left knee: Secondary | ICD-10-CM

## 2016-02-21 DIAGNOSIS — M1990 Unspecified osteoarthritis, unspecified site: Secondary | ICD-10-CM

## 2016-02-21 DIAGNOSIS — F32A Depression, unspecified: Secondary | ICD-10-CM

## 2016-02-21 MED ORDER — DICLOFENAC SODIUM 1 % TD GEL: 4.0000 g | Freq: Four times a day (QID) | TRANSDERMAL | Status: DC

## 2016-02-21 MED ORDER — MELATONIN 1 MG PO CAPS: 1.0000 mg | ORAL_CAPSULE | Freq: Every evening | ORAL | Status: DC | PRN

## 2016-02-21 MED ORDER — METOPROLOL TARTRATE 25 MG PO TABS: 25.0000 mg | ORAL_TABLET | Freq: Two times a day (BID) | ORAL | Status: DC

## 2016-02-21 MED ORDER — HYDROCHLOROTHIAZIDE 25 MG PO TABS: 25.0000 mg | ORAL_TABLET | Freq: Every day | ORAL | Status: DC

## 2016-02-21 MED ORDER — MELOXICAM 7.5 MG PO TABS
7.5000 mg | ORAL_TABLET | Freq: Every day | ORAL | Status: DC
Start: 2016-02-21 — End: 2016-04-17

## 2016-02-21 NOTE — Assessment & Plan Note (Addendum)
A: PHQ-9 of 21 today with no active SI, only passive SI. It is unclear if this represents a primary depression or adjustment disorder related to her ostomy. She is against SSRIs at this time. She was told to immediately go to the emergency room should she feel as though she is a threat to herself. Her symptoms may improve after addressing her insominia  P: Will continue to monitor as she grows accustomed to her ostomy and as we address her insomnia.

## 2016-02-21 NOTE — Patient Instructions (Signed)
Deanna Aguilar, it was a joy meeting you today.  Your left knee pain is likely due to osteoarthritis. We are checking X-rays today to confirm this diagnosis. In the mean time, you can take Mobic once daily for 7 days. You can also apply Voltaren gel four times daily to the knee for as long as you need to.  For your insomnia and depression, we are starting melatonin 1 mg nightly. If this is ineffective, we can discuss starting Trazodone the next visit. If you feel you are going to hurt yourself, go straight to the Emergency Room.   We are refilling your metoprolol and HCTZ today for blood pressure.  You should here from our health coach regarding the transition you are having with the Ostomy bag.  We will see you in a month.

## 2016-02-21 NOTE — Assessment & Plan Note (Addendum)
A: Blood pressure controlled today at 127/61. She is adherent to her medications. Reviewed CMET from visit, which only revealed mildly elevated creatinine to 1.05, BUN to 26. She is does not describe any reduced urination or volume depletion today.  P: Refill metoprolol and HCTZ

## 2016-02-21 NOTE — Assessment & Plan Note (Signed)
A: Patient currently only sleeps 3.5 hours per night, difficulty initiating due to anxiety. She wants to try more natural sleep aids and would discuss trazodone if this is ineffective.  P: Melatonin 1 mg nightly

## 2016-02-21 NOTE — Assessment & Plan Note (Signed)
A: CHADS-VASc of 2.  It is likely her irregularly rhythm was isolated in the context of acute peritonitis. She is not in A fib today, but if this were to re-emerge, she may need anticoagulation.  P: No need for anticoagulation

## 2016-02-21 NOTE — Assessment & Plan Note (Signed)
A: Patient recently told that she is not a candidate for colostomy reversal. This his emotionally distressing to her, understandably so. She is amenable to speaking with a health coach. She also expresses concerns affording her ostomy supplies. Site shows no redness or swelling.    P: SW consult to address affordability of ostomy supplies Health coach referral

## 2016-02-21 NOTE — Progress Notes (Signed)
Subjective:    Patient ID: Deanna Aguilar, female    DOB: [birth year], 60 y.o.   MRN: [id number]  HPI  Deanna Aguilar is a 60 year old woman with a PMH as below who presents with left knee pain, emotional distress after being told that her colostomy could not be reversed, and insomnia.  She reports longstanding joint pain, especially in her left knee ever since a knee injury when she was younger. This weekend, the pain in her left knee acutely worsened. She has tried Tylenol and Aleve to minimal effect. She was given a cane after he colectomy over there summer and has not been using it, and now due to the pain, she is using it again. She said she's had gout in the past, but it's been "years." She says this is dissimilar from her gout flares. She endorses some minimal swelling in the joint. She denies any fever. She denies any recent trauma to the knee.  She was recently told by Dr. Dalbert Batman at [facility] that she would not be a candidate for ostomy reversal. She indicates that this is extremely distressing to her and has thrown her into a "depression." She has many concerns how her life will be with the ostomy in place. She's had little interest in doing things she used to and spends most of her time staring out the window. She has difficulty initiating sleep, only sleeping 3.5 hours a night. She has been overeating, and has put on 20 lbs since last year. Her family has noticed that she appears more sluggish and is talking slower. She's had some passive suicidal ideation, wondering if she's be better off dead, but she has no active plan. She also has concerns about affording her ostomy supplies and has been quickly running out. She says Medicaid only covers 4 boxes a month and she thinks she needs 8 boxes. She is strongly against any antidepressant medication at this time.  Active Ambulatory Problems    Diagnosis Date Noted  . Diverticulitis of colon with perforation s/p colectomy/ostomy  06/09/2015 06/07/2015  . HTN (hypertension) 06/07/2015  . Acute renal insufficiency 06/07/2015  . Morbid obesity ([place]) 06/10/2015  . Arthritis- on disability 06/10/2015  . Depression 06/13/2015  . Colostomy in place [facility]) 06/13/2015  . PAF (paroxysmal atrial fibrillation) ([place]) 08/18/2015  . Essential hypertension 08/18/2015  . Preventative health care 01/11/2016  . Insomnia 02/21/2016   Resolved Ambulatory Problems    Diagnosis Date Noted  . Atrial fibrillation with RVR ([place]) 06/11/2015  . H/O colostomy 08/18/2015   Past Medical History  Diagnosis Date  . Hypertension      Review of Systems  Constitutional: Positive for activity change, appetite change and fatigue. Negative for fever, chills and diaphoresis.  HENT: Negative for congestion and sore throat.   Eyes: Negative for photophobia and visual disturbance.  Respiratory: Negative for cough, shortness of breath and wheezing.   Cardiovascular: Negative for chest pain, palpitations and leg swelling.  Gastrointestinal: Negative for nausea, vomiting, diarrhea and constipation.  Endocrine: Negative for polydipsia and polyuria.  Genitourinary: Negative for dysuria and urgency.  Musculoskeletal: Positive for back pain, joint swelling, arthralgias and gait problem. Negative for myalgias.  Skin: Negative for pallor and rash.  Neurological: Negative for dizziness, syncope and weakness.  Psychiatric/Behavioral: Positive for suicidal ideas and dysphoric mood. The patient is nervous/anxious.        Objective:   Physical Exam  Constitutional: No distress.  Obese.  HENT:  Head: Normocephalic  and atraumatic.  Mouth/Throat: Oropharynx is clear and moist. No oropharyngeal exudate.  Eyes: EOM are normal. Pupils are equal, round, and reactive to light. No scleral icterus.  Neck: Normal range of motion. Neck supple. No thyromegaly present.  Cardiovascular: Normal rate, regular rhythm and normal heart sounds.   Pulmonary/Chest: Effort  normal and breath sounds normal. No respiratory distress. She has no wheezes.  Abdominal: Soft. Bowel sounds are normal. She exhibits no distension. There is no tenderness.  Ostomy in place. No surrounding erythema or signs of inflammation.  Musculoskeletal:  Crepitus and TTP of left knee joint. Minimal swelling present. No warmth or erythema. Ambulates with mild limp.  Lymphadenopathy:    She has no cervical adenopathy.  Neurological: She is alert. She has normal reflexes.  Skin: Skin is warm and dry. She is not diaphoretic.  Psychiatric:  PHQ-9 21. Intermittently tearful when discussing ostomy. Only passive SI, no active SI.  Vitals reviewed.     Assessment & Plan:   Please see problem based assessment and plan for details.

## 2016-02-21 NOTE — Assessment & Plan Note (Signed)
A: Low suspicion for gout based on my physical exam, and this likely represents OA. No knee xrays on file. Will try a 7 day course of mobic with voltaren gel. She may need a steroid injection if this is ineffective.  P: Knee xrays Mobic x1 week Voltaren gel

## 2016-02-22 ENCOUNTER — Ambulatory Visit: Payer: Self-pay | Admitting: Cardiology

## 2016-02-22 NOTE — Progress Notes (Signed)
Internal Medicine Clinic Attending  Case discussed with Dr. Ford at the time of the visit.  We reviewed the resident's history and exam and pertinent patient test results.  I agree with the assessment, diagnosis, and plan of care documented in the resident's note.  

## 2016-02-23 ENCOUNTER — Telehealth: Payer: Self-pay | Admitting: Licensed Clinical Social Worker

## 2016-02-23 NOTE — Telephone Encounter (Signed)
CSW placed call to Ms. Rookstool to follow up on pt's concern regarding limited amount ostomy supplies and financial cost.  Ms. Harlos states she received her ostomy in July/Aug 2016 and was d/c to a SNF.  Starting November-December pt had difficulty using one ostomy for 7 days.  Pt states insurance guidelines supply one bag every 7 days.  Ms. Asturias states she reuses and washes out bags as much as she can but when she has diarrhea she can not reuse.  In addition, odor prevents her from reusing at times.  Pt states she can not afford to private pay for additional bags.  Ms. Figures informed [facility] will explore if our office can assist with ordering additional supplies or if override/authorization is possible.

## 2016-02-23 NOTE — Telephone Encounter (Signed)
After exploring options, pt is currently receiving 20 bags/month, which exceeds pt's initial comment that she can only change every 7 days.  Pt confirms she is receiving 20 bags a month but prefers to change her bag daily.  CSW reviewed [place] [employer] guidelines which limits max to 20 bags/month and [facility] can not over-ride patients benefit limits.  Pt became agitated and CSW referred pt to current Medicaid Caseworker or [place] [employer].  Pt declined need for the phone number.  CSW will sign off.

## 2016-02-28 ENCOUNTER — Encounter: Payer: Self-pay | Admitting: *Deleted

## 2016-03-10 ENCOUNTER — Encounter: Payer: Self-pay | Admitting: Cardiology

## 2016-03-10 ENCOUNTER — Ambulatory Visit (INDEPENDENT_AMBULATORY_CARE_PROVIDER_SITE_OTHER): Payer: Medicaid Other | Admitting: Cardiology

## 2016-03-10 VITALS — BP 128/80 | HR 72 | Ht 69.0 in | Wt 282.0 lb

## 2016-03-10 DIAGNOSIS — I480 Paroxysmal atrial fibrillation: Secondary | ICD-10-CM | POA: Diagnosis not present

## 2016-03-10 DIAGNOSIS — I10 Essential (primary) hypertension: Secondary | ICD-10-CM

## 2016-03-10 DIAGNOSIS — IMO0002 Reserved for concepts with insufficient information to code with codable children: Secondary | ICD-10-CM

## 2016-03-10 DIAGNOSIS — Z9889 Other specified postprocedural states: Secondary | ICD-10-CM | POA: Diagnosis not present

## 2016-03-10 MED ORDER — METOPROLOL TARTRATE 25 MG PO TABS: 25.0000 mg | ORAL_TABLET | Freq: Two times a day (BID) | ORAL | Status: DC

## 2016-03-10 MED ORDER — HYDROCHLOROTHIAZIDE 25 MG PO TABS: 25.0000 mg | ORAL_TABLET | Freq: Every day | ORAL | Status: DC

## 2016-03-10 NOTE — Progress Notes (Signed)
Cardiology Office Note    Date:  03/10/2016   ID:  Deanna Aguilar, DOB January 30, 1956, MRN [id number]  PCP:  No primary care provider on file.  Cardiologist:   Candee Furbish, MD   Former PCP was Dr. Lindwood Qua (saw him since she was 61 years old) she is now seeing the internal medicine clinic at [facility].    History of Present Illness:  Deanna Aguilar is a 60 y.o. female with previous episode of paroxysmal atrial fibrillation in the setting of her tinnitus on 06/12/15, converted on IV amiodarone. We discontinued the amiodarone and Eliquis after she demonstrated normal rhythm. She had a reversible cause for her atrial fibrillation. 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 She did feel an occasional palpitation but nothing prolonged. No chest pain, no shortness of breath.    Past Medical History  Diagnosis Date  . Hypertension     Past Surgical History  Procedure Laterality Date  . Tubal ligation    . Colon resection N/A 06/09/2015    Procedure: SIGMOID COLECTOMY, COLOSTOMY, TAKE DOWN SPLENIC FLECTURE;  Surgeon: Fanny Skates, MD;  Location: [facility] OR[facility];  Service: General;  Laterality: N/A;    Current Medications: Outpatient Prescriptions Prior to Visit  Medication Sig Dispense Refill  . diclofenac sodium (VOLTAREN) 1 % GEL Apply 4 g topically 4 (four) times daily. 1 Tube 2  . Melatonin 1 MG CAPS Take 1 capsule (1 mg total) by mouth at bedtime as needed. 60 capsule 2  . meloxicam (MOBIC) 7.5 MG tablet Take 1 tablet (7.5 mg total) by mouth daily. 14 tablet 0  . Ostomy Supplies (ADHESIVE REMOVER WIPES) MISC 1 Units by Does not apply route daily. 50 each 0  . Ostomy Supplies (OSTOMY DRAINABLE POUCH/FLANGE) MISC 1 Units by Does not apply route daily. 20 each 0  . Ostomy Supplies (SECURI-T OSTOMY DEODORANT) LIQD 1 Units by Does not apply route daily. 1 Bottle o  . Ostomy Supplies (SKIN PREP WIPES) MISC 1 Units by Does not apply route daily. 50 each 0  . hydrochlorothiazide  (HYDRODIURIL) 25 MG tablet Take 1 tablet (25 mg total) by mouth daily. 60 tablet 2  . metoprolol tartrate (LOPRESSOR) 25 MG tablet Take 1 tablet (25 mg total) by mouth 2 (two) times daily. Take 2 tablets by mouth twice daily 120 tablet 1   No facility-administered medications prior to visit.     Allergies:   Review of patient's allergies indicates no known allergies.   Social History   Social History  . Marital Status: Legally Separated    Spouse Name: N/A  . Number of Children: N/A  . Years of Education: N/A   Social History Main Topics  . Smoking status: Never Smoker   . Smokeless tobacco: None  . Alcohol Use: No  . Drug Use: No  . Sexual Activity: Not Asked   Other Topics Concern  . None   Social History Narrative     Family History:  The patient's family history includes Heart attack in her mother; Heart disease in her mother; Hypertension in her mother; Other in her maternal grandmother.   ROS:   Please see the history of present illness.    ROS All other systems reviewed and are negative.   PHYSICAL EXAM:   VS:  BP 128/80 mmHg  Pulse 72  Ht 5\' 9"  (1.753 m)  Wt 282 lb (127.914 kg)  BMI 41.63 kg/m2   GEN: Well nourished, well developed, in no acute distress  HEENT: normal Neck: no JVD, carotid bruits, or masses Cardiac: RRR; no murmurs, rubs, or gallops,no edema  Respiratory:  clear to auscultation bilaterally, normal work of breathing GI: soft, nontender, nondistended, + BS, ostomy noted. MS: no deformity or atrophy Skin: warm and dry, no rash Neuro:  Alert and Oriented x 3, Strength and sensation are intact Psych: euthymic mood, full affect  Wt Readings from Last 3 Encounters:  03/10/16 282 lb (127.914 kg)  02/21/16 284 lb (128.822 kg)  01/11/16 274 lb 8 oz (124.512 kg)      Studies/Labs Reviewed:   EKG: Prior EKG demonstrated sinus rhythm.  Recent Labs: 06/11/2015: Magnesium 2.6*; TSH 0.579 06/18/2015: Hemoglobin 8.0* 01/11/2016: ALT 7; BUN 26*;  Creatinine, Ser 1.05*; Platelets 318; Potassium 4.0; Sodium 142   Lipid Panel No results found for: CHOL, TRIG, HDL, CHOLHDL, VLDL, LDLCALC, LDLDIRECT  Additional studies/ records that were reviewed today include:  Prior office note, lab work, EKGs reviewed.    ASSESSMENT:    1. PAF (paroxysmal atrial fibrillation) ([place])   2. Essential hypertension   3. Morbid obesity due to excess calories ([place])   4. H/O colostomy      PLAN:  In order of problems listed above:  Paroxysmal atrial fibrillation -She had a reversible cause, peritonitis, no further episodes. Excellent. Previously utilized amiodarone/Eliquis. No longer on these medications. Continue with Lopressor.  Essential hypertension -Overall well controlled on current regimen. No changes.  Ostomy -Per surgical team. Depression following this.  Obesity, morbid -Continue to gain weight. We discussed decreasing carbohydrates.    Medication Adjustments/Labs and Tests Ordered: Current medicines are reviewed at length with the patient today.  Concerns regarding medicines are outlined above.  Medication changes, Labs and Tests ordered today are listed in the Patient Instructions below. Patient Instructions  Medication Instructions:  The current medical regimen is effective;  continue present plan and medications.  Follow-Up: Follow up as needed with Dr Marlou Porch.  If you need a refill on your cardiac medications before your next appointment, please call your pharmacy.  Thank you for choosing [facility]!!           Signed, Candee Furbish, MD  03/10/2016 10:47 AM    [facility] [address], [place], [place]  [address] Phone: [phone]; Fax: [phone]

## 2016-03-10 NOTE — Patient Instructions (Signed)
Medication Instructions:  The current medical regimen is effective;  continue present plan and medications.  Follow-Up: Follow up as needed with Dr Skains.  If you need a refill on your cardiac medications before your next appointment, please call your pharmacy.  Thank you for choosing [facility]!!     

## 2016-03-13 ENCOUNTER — Ambulatory Visit: Payer: Self-pay | Admitting: Dietician

## 2016-03-14 ENCOUNTER — Other Ambulatory Visit: Payer: Self-pay | Admitting: *Deleted

## 2016-03-14 DIAGNOSIS — I10 Essential (primary) hypertension: Secondary | ICD-10-CM

## 2016-03-14 NOTE — Telephone Encounter (Signed)
Please advise on how to order this medication. Is it  (1) one  tab bid or (2)  tabs bid.

## 2016-03-15 MED ORDER — METOPROLOL TARTRATE 25 MG PO TABS: 50.0000 mg | ORAL_TABLET | Freq: Two times a day (BID) | ORAL | Status: DC

## 2016-03-16 ENCOUNTER — Encounter: Payer: Self-pay | Admitting: Internal Medicine

## 2016-03-21 ENCOUNTER — Telehealth: Payer: Self-pay | Admitting: Dietician

## 2016-03-21 ENCOUNTER — Ambulatory Visit: Payer: Self-pay | Admitting: Dietician

## 2016-03-21 NOTE — Telephone Encounter (Signed)
Patient wants to reschedule her appointment for a day after may 1. Left message of open days and times.

## 2016-04-06 ENCOUNTER — Encounter: Payer: Self-pay | Admitting: Internal Medicine

## 2016-04-10 ENCOUNTER — Encounter: Payer: Self-pay | Admitting: Dietician

## 2016-04-11 ENCOUNTER — Encounter: Payer: Self-pay | Admitting: Dietician

## 2016-04-11 ENCOUNTER — Encounter: Payer: Self-pay | Admitting: Internal Medicine

## 2016-04-14 ENCOUNTER — Encounter: Payer: Self-pay | Admitting: Dietician

## 2016-04-14 ENCOUNTER — Ambulatory Visit: Payer: Self-pay | Admitting: Internal Medicine

## 2016-04-17 ENCOUNTER — Encounter: Payer: Self-pay | Admitting: Internal Medicine

## 2016-04-17 ENCOUNTER — Ambulatory Visit: Payer: Medicaid Other | Admitting: Dietician

## 2016-04-17 ENCOUNTER — Ambulatory Visit (INDEPENDENT_AMBULATORY_CARE_PROVIDER_SITE_OTHER): Payer: Medicaid Other | Admitting: Internal Medicine

## 2016-04-17 DIAGNOSIS — M1990 Unspecified osteoarthritis, unspecified site: Secondary | ICD-10-CM | POA: Diagnosis present

## 2016-04-17 DIAGNOSIS — F329 Major depressive disorder, single episode, unspecified: Secondary | ICD-10-CM

## 2016-04-17 DIAGNOSIS — I10 Essential (primary) hypertension: Secondary | ICD-10-CM | POA: Diagnosis not present

## 2016-04-17 DIAGNOSIS — F32A Depression, unspecified: Secondary | ICD-10-CM

## 2016-04-17 MED ORDER — DICLOFENAC SODIUM 1 % TD GEL: 4.0000 g | Freq: Four times a day (QID) | TRANSDERMAL | Status: DC

## 2016-04-17 MED ORDER — MELOXICAM 7.5 MG PO TABS: 7.5000 mg | ORAL_TABLET | Freq: Every day | ORAL | Status: DC

## 2016-04-17 MED ORDER — ACETAMINOPHEN 325 MG PO CAPS: 650.0000 mg | ORAL_CAPSULE | Freq: Two times a day (BID) | ORAL | Status: DC

## 2016-04-17 MED ORDER — HYDROCHLOROTHIAZIDE 25 MG PO TABS: 25.0000 mg | ORAL_TABLET | Freq: Every day | ORAL | Status: DC

## 2016-04-17 MED ORDER — METOPROLOL TARTRATE 25 MG PO TABS: 50.0000 mg | ORAL_TABLET | Freq: Two times a day (BID) | ORAL | Status: DC

## 2016-04-17 NOTE — Progress Notes (Signed)
Health Coaching Progress Notes Appt start:1334 Appt end:1405  Patient Identified Concern:  Dealing with acceptance/depression about her ostomy,many life changes at once ( loss of mother, two moves, loss of long time doctor and getting used to new doctor(s) Stage of Change Patient Is In:  difficult to tell but she seems ready to make some changes Patient Reported Barriers:  Depression, finances, being alone Behavior Change Supports:  Sister in law  Patient Reported Perceived Benefits: ostomy has made her become more of a woman and see things from a different perspective.  Patient Reports Self-Efficacy:  Not discussed today, but she seemed self confident about knowing what she wanted and follow through.   Goals:  She plans to contact her home health company to find out about ostomy support group Patient Education:  What health coaching is Follow Up: she denied need/desire for follow up

## 2016-04-17 NOTE — Patient Instructions (Addendum)
Deanna Aguilar,  It was a pleasure seeing you again.   I'm sorry you're still having knee pain. You can use the Voltaren gel on your knees and anywhere else that hurts from now until the end of time. You can also take Tylenol 650 mg twice a day for your pain.  For times when the pain becomes unbearable, please try Meloxicam 7.5 mg daily for 7 days. Wait at least a week between each course.   Your blood pressure looks excellent. Keep up the good work.

## 2016-04-17 NOTE — Assessment & Plan Note (Signed)
A: BP is well controlled today.  P: Refill metoprolol and HCTZ

## 2016-04-17 NOTE — Progress Notes (Signed)
   Subjective:    Patient ID: Deanna Aguilar, female    DOB: 01-05-1956, 60 y.o.   MRN: [id number]  HPI  Deanna Aguilar is a 60 year old woman with a PMH of diverticulitis s/p perforation with colostomy placement, HTN, morbid obesity, osteoarthritis who comes to the clinic to discuss her ongoing bilateral knee pain and HTN. She feels the Voltaren gel has been effective in bringing her pain from at 10 to a 7 or 8, but her goal is around 4 or 5. When she was taking meloxicam in addition to the gel, her pain level was at goal; however, I had only prescribed her a 7 day course at that time. She has been adherent and has been tolerating her HCTZ 25 mg and Metoprolol 50 mg BID.  Review of Systems  Respiratory: Negative for cough and shortness of breath.   Cardiovascular: Negative for chest pain, palpitations and leg swelling.  Gastrointestinal: Negative for nausea and diarrhea.       Changes colostomy bag once or twice daily.  Endocrine: Negative for polydipsia, polyphagia and polyuria.  Musculoskeletal: Positive for back pain and arthralgias.  Neurological: Negative for light-headedness.  Psychiatric/Behavioral: Negative for dysphoric mood. The patient is not nervous/anxious.        Objective:   Physical Exam Constitutional: No distress.  Obese.  Cardiovascular: Normal rate, regular rhythm and normal heart sounds.  Pulmonary/Chest: Effort normal and breath sounds normal. No respiratory distress. She has no wheezes.  Abdominal: Soft. Bowel sounds are normal. She exhibits no distension. There is no tenderness.  Ostomy in place. No surrounding erythema or signs of inflammation.  Musculoskeletal: No significant swelling, pain with palpation or against resistance in knees. Normal gait Neurological: She is alert. She has normal reflexes.  Skin: Skin is warm and dry. She is not diaphoretic.  Psychiatric: Normal behavior and affect. Vitals reviewed.     Assessment & Plan:   Please see problem  based assessment and plan for details.

## 2016-04-17 NOTE — Assessment & Plan Note (Signed)
A: Pt has no h/o CAD, so relatively lower risk for being on NSAIDs. Last Creatinine is 1.05. Given her significant functional improvement on meloxicam, she should be well suited for intermittent short 7-day courses of meloxicam. We will also add tylenol prn to be used prior to trying meloxicam. She can continue voltaren gel  P: Meloxicam 7.5 mg daily for 7-day courses, separated by at least a week Tylenol 650 mg BID prn Voltaren gel

## 2016-04-18 NOTE — Progress Notes (Signed)
Internal Medicine Clinic Attending  Case discussed with Dr. Ford at the time of the visit.  We reviewed the resident's history and exam and pertinent patient test results.  I agree with the assessment, diagnosis, and plan of care documented in the resident's note.  

## 2016-07-19 ENCOUNTER — Ambulatory Visit (INDEPENDENT_AMBULATORY_CARE_PROVIDER_SITE_OTHER): Payer: Medicaid Other | Admitting: Internal Medicine

## 2016-07-19 ENCOUNTER — Encounter: Payer: Self-pay | Admitting: Internal Medicine

## 2016-07-19 VITALS — BP 135/67 | HR 61 | Temp 98.4°F | Ht 69.0 in | Wt 293.7 lb

## 2016-07-19 DIAGNOSIS — Z0000 Encounter for general adult medical examination without abnormal findings: Secondary | ICD-10-CM

## 2016-07-19 DIAGNOSIS — I10 Essential (primary) hypertension: Secondary | ICD-10-CM

## 2016-07-19 DIAGNOSIS — M1990 Unspecified osteoarthritis, unspecified site: Secondary | ICD-10-CM

## 2016-07-19 DIAGNOSIS — M170 Bilateral primary osteoarthritis of knee: Secondary | ICD-10-CM

## 2016-07-19 DIAGNOSIS — Z933 Colostomy status: Secondary | ICD-10-CM | POA: Diagnosis not present

## 2016-07-19 MED ORDER — DICLOFENAC SODIUM 1 % TD GEL: 4.0000 g | Freq: Four times a day (QID) | TRANSDERMAL | 2 refills | Status: DC

## 2016-07-19 MED ORDER — MELOXICAM 7.5 MG PO TABS: 7.5000 mg | ORAL_TABLET | Freq: Every day | ORAL | 0 refills | Status: DC

## 2016-07-19 MED ORDER — ACETAMINOPHEN 500 MG PO TABS: 1000.0000 mg | ORAL_TABLET | Freq: Three times a day (TID) | ORAL | 2 refills | Status: AC | PRN

## 2016-07-19 NOTE — Progress Notes (Signed)
   CC: Knee pain  HPI:  [name] is a 60 y.o. woman with a PMH of diverticulitis s/p perforation with colostomy placement in 2016, HTN, morbid obesity, osteoarthritis who presents for follow up management of her osteoarthritis and hypertension.  She reports bilateral knee pain from her osteoarthritis that is controlled fairly well with current therapy. She takes Meloxicam 7.5 mg daily for 7 days and takes a 1-2 week break before resuming it again. She uses Voltaren gel daily with relief as well. She has tried Tylenol 650 mg as needed without much relief. Instead she has used Aleve. She feels her current therapy allows her to do her daily activities.  She takes Metoprolol 50 mg BID and HCTZ 25 mg daily for blood pressure and reports adherence. BP this visit is 135/67.  She has adjusted to using her colostomy since placement in July 2016 by Dr. Dalbert Batman. Her supply prescription is through February 2018 and she wants to make sure we can help continue her supply prescription at least a couple weeks prior to end date so she does not run out at that time.  Past Medical History:  Diagnosis Date  . Hypertension     Review of Systems:   Review of Systems  Constitutional: Negative for chills and fever.  Respiratory: Negative for cough and shortness of breath.   Cardiovascular: Negative for chest pain.  Gastrointestinal: Negative for abdominal pain.       Colostomy in place  Musculoskeletal: Positive for back pain and joint pain. Negative for falls and myalgias.       Low back pain, bilateral knee pain L > R     Physical Exam:  Vitals:   07/19/16 1332  BP: 135/67  Pulse: 61  Temp: 98.4 F (36.9 C)  TempSrc: Oral  SpO2: 100%  Weight: 293 lb 11.2 oz (133.2 kg)  Height: 5\' 9"  (1.753 m)   General: pleasant woman, no distress Cardiac: RRR, no rubs, murmurs or gallops Pulm: clear to auscultation bilaterally, moving normal volumes of air Abd: colostomy in place with pink stoma,  nontender, nondistended, BS present Ext: no significant swelling, erythema, warmth or tenderness to palpation at left or right knee, slight crepitus with active extension flexion at the knee, no pedal edema Neuro: alert and oriented X3  Assessment & Plan:   See Encounters Tab for problem based charting.  Patient discussed with Dr. Evette Doffing

## 2016-07-19 NOTE — Patient Instructions (Addendum)
It was a pleasure to meet you Deanna Aguilar.  Please continue your Meloxicam as you are, 1 week at a time. Avoid NSAID medications (Ibuprofen, advil, aleve) while you are taking this. Continue your Voltaren gel and ice/heat packs as needed.  You can try Tylenol 1000 mg every 8 hours as needed for your back pain.  Continue your Metoprolol and Hydrochlorothiazide.  Please bring a copy of your medical records to our office so we can update your information here.  Please have your ostomy supply refills sent to [place] at least 2 weeks before they are due so we can have this refilled for you before you are out of supplies.   DASH Eating Plan DASH stands for "Dietary Approaches to Stop Hypertension." The DASH eating plan is a healthy eating plan that has been shown to reduce high blood pressure (hypertension). Additional health benefits may include reducing the risk of type 2 diabetes mellitus, heart disease, and stroke. The DASH eating plan may also help with weight loss. WHAT DO I NEED TO KNOW ABOUT THE DASH EATING PLAN? For the DASH eating plan, you will follow these general guidelines:  Choose foods with a percent daily value for sodium of less than 5% (as listed on the food label).  Use salt-free seasonings or herbs instead of table salt or sea salt.  Check with your health care provider or pharmacist before using salt substitutes.  Eat lower-sodium products, often labeled as "lower sodium" or "no salt added."  Eat fresh foods.  Eat more vegetables, fruits, and low-fat dairy products.  Choose whole grains. Look for the word "whole" as the first word in the ingredient list.  Choose fish and skinless chicken or [place] more often than red meat. Limit fish, poultry, and meat to 6 oz (170 g) each day.  Limit sweets, desserts, sugars, and sugary drinks.  Choose heart-healthy fats.  Limit cheese to 1 oz (28 g) per day.  Eat more home-cooked food and less restaurant, buffet, and fast  food.  Limit fried foods.  Cook foods using methods other than frying.  Limit canned vegetables. If you do use them, rinse them well to decrease the sodium.  When eating at a restaurant, ask that your food be prepared with less salt, or no salt if possible. WHAT FOODS CAN I EAT? Seek help from a dietitian for individual calorie needs. Grains Whole grain or whole wheat bread. Brown rice. Whole grain or whole wheat pasta. Quinoa, bulgur, and whole grain cereals. Low-sodium cereals. Corn or whole wheat flour tortillas. Whole grain cornbread. Whole grain crackers. Low-sodium crackers. Vegetables Fresh or frozen vegetables (raw, steamed, roasted, or grilled). Low-sodium or reduced-sodium tomato and vegetable juices. Low-sodium or reduced-sodium tomato sauce and paste. Low-sodium or reduced-sodium canned vegetables.  Fruits All fresh, canned (in natural juice), or frozen fruits. Meat and Other Protein Products Ground beef (85% or leaner), grass-fed beef, or beef trimmed of fat. Skinless chicken or [place]. Ground chicken or [place]. Pork trimmed of fat. All fish and seafood. Eggs. Dried beans, peas, or lentils. Unsalted nuts and seeds. Unsalted canned beans. Dairy Low-fat dairy products, such as skim or 1% milk, 2% or reduced-fat cheeses, low-fat ricotta or cottage cheese, or plain low-fat yogurt. Low-sodium or reduced-sodium cheeses. Fats and Oils Tub margarines without trans fats. Light or reduced-fat mayonnaise and salad dressings (reduced sodium). Avocado. Safflower, olive, or canola oils. Natural peanut or almond butter. Other Unsalted popcorn and pretzels. The items listed above may not be a complete list  of recommended foods or beverages. Contact your dietitian for more options. WHAT FOODS ARE NOT RECOMMENDED? Grains White bread. White pasta. White rice. Refined cornbread. Bagels and croissants. Crackers that contain trans fat. Vegetables Creamed or fried vegetables. Vegetables in a  cheese sauce. Regular canned vegetables. Regular canned tomato sauce and paste. Regular tomato and vegetable juices. Fruits Dried fruits. Canned fruit in light or heavy syrup. Fruit juice. Meat and Other Protein Products Fatty cuts of meat. Ribs, chicken wings, bacon, sausage, bologna, salami, chitterlings, fatback, hot dogs, bratwurst, and packaged luncheon meats. Salted nuts and seeds. Canned beans with salt. Dairy Whole or 2% milk, cream, half-and-half, and cream cheese. Whole-fat or sweetened yogurt. Full-fat cheeses or blue cheese. Nondairy creamers and whipped toppings. Processed cheese, cheese spreads, or cheese curds. Condiments Onion and garlic salt, seasoned salt, table salt, and sea salt. Canned and packaged gravies. Worcestershire sauce. Tartar sauce. Barbecue sauce. Teriyaki sauce. Soy sauce, including reduced sodium. Steak sauce. Fish sauce. Oyster sauce. Cocktail sauce. Horseradish. Ketchup and mustard. Meat flavorings and tenderizers. Bouillon cubes. Hot sauce. Tabasco sauce. Marinades. Taco seasonings. Relishes. Fats and Oils Butter, stick margarine, lard, shortening, ghee, and bacon fat. Coconut, palm kernel, or palm oils. Regular salad dressings. Other Pickles and olives. Salted popcorn and pretzels. The items listed above may not be a complete list of foods and beverages to avoid. Contact your dietitian for more information. WHERE CAN I FIND MORE INFORMATION? National Heart, Lung, and Blood Institute: travelstabloid.com   This information is not intended to replace advice given to you by your health care provider. Make sure you discuss any questions you have with your health care provider.   Document Released: 11/09/2011 Document Revised: 12/11/2014 Document Reviewed: 09/24/2013 Elsevier Interactive Patient Education [employer].

## 2016-07-20 NOTE — Assessment & Plan Note (Signed)
She takes Metoprolol 50 mg BID and HCTZ 25 mg daily for blood pressure and reports adherence. BP this visit is 135/67. -Continue current medications -Provided information on DASH diet

## 2016-07-20 NOTE — Assessment & Plan Note (Signed)
Has bilateral knee pain from her osteoarthritis that is controlled fairly well with current therapy. She takes Meloxicam 7.5 mg daily for 7 days and takes a 1-2 week break before resuming it again. She uses Voltaren gel daily with relief as well. She has tried Tylenol 650 mg as needed without much relief. Instead she has used Aleve. She feels her current therapy allows her to do her daily activities and understands this is the goal as she will likely always have some underlying pain from her OA. -Continue voltaren gel -Continue Meloxicam 7.5 mg daily for 7 day courses separated by at least a week -Avoid additional NSAIDs while on Meloxicam -Start Tylenol 1000 mg every 8 hours prn

## 2016-07-20 NOTE — Assessment & Plan Note (Signed)
Former patient of Dr. Lindwood Qua, will try to obtain prior records.

## 2016-07-20 NOTE — Assessment & Plan Note (Signed)
She has adjusted to using her colostomy since placement in July 2016 by Dr. Dalbert Batman. Her supply prescription is through February 2018 and she wants to make sure we can help continue her supply prescription at least a couple weeks prior to end date so she does not run out at that time. -Will refill supplies end of current prescription; will have patient follow up with me prior to February 2018

## 2016-07-20 NOTE — Progress Notes (Signed)
Internal Medicine Clinic Attending  Case discussed with Dr. Patel,Vishal at the time of the visit.  We reviewed the resident's history and exam and pertinent patient test results.  I agree with the assessment, diagnosis, and plan of care documented in the resident's note.  

## 2017-01-10 ENCOUNTER — Encounter: Payer: Self-pay | Admitting: Internal Medicine

## 2017-03-21 ENCOUNTER — Encounter (INDEPENDENT_AMBULATORY_CARE_PROVIDER_SITE_OTHER): Payer: Self-pay

## 2017-03-21 ENCOUNTER — Ambulatory Visit (INDEPENDENT_AMBULATORY_CARE_PROVIDER_SITE_OTHER): Payer: Medicaid Other | Admitting: Internal Medicine

## 2017-03-21 VITALS — BP 132/63 | HR 56 | Temp 98.4°F | Wt 292.5 lb

## 2017-03-21 DIAGNOSIS — Z87891 Personal history of nicotine dependence: Secondary | ICD-10-CM | POA: Diagnosis not present

## 2017-03-21 DIAGNOSIS — I10 Essential (primary) hypertension: Secondary | ICD-10-CM

## 2017-03-21 DIAGNOSIS — Z6841 Body Mass Index (BMI) 40.0 and over, adult: Secondary | ICD-10-CM | POA: Diagnosis not present

## 2017-03-21 DIAGNOSIS — Z79899 Other long term (current) drug therapy: Secondary | ICD-10-CM

## 2017-03-21 DIAGNOSIS — M170 Bilateral primary osteoarthritis of knee: Secondary | ICD-10-CM | POA: Diagnosis not present

## 2017-03-21 DIAGNOSIS — M1990 Unspecified osteoarthritis, unspecified site: Secondary | ICD-10-CM

## 2017-03-21 MED ORDER — MELOXICAM 7.5 MG PO TABS: 7.5000 mg | ORAL_TABLET | Freq: Every day | ORAL | 2 refills | Status: DC

## 2017-03-21 MED ORDER — METOPROLOL TARTRATE 25 MG PO TABS: 50.0000 mg | ORAL_TABLET | Freq: Two times a day (BID) | ORAL | 11 refills | Status: DC

## 2017-03-21 MED ORDER — HYDROCHLOROTHIAZIDE 25 MG PO TABS: 25.0000 mg | ORAL_TABLET | Freq: Every day | ORAL | 3 refills | Status: DC

## 2017-03-21 NOTE — Patient Instructions (Signed)
Deanna Aguilar,   I have sent in a refill of your medications to the [facility].   Please follow up with Dr. Bobby Rumpf on 6/26 at 9:45 am.

## 2017-03-21 NOTE — Assessment & Plan Note (Signed)
Patient with chronic bilateral knee pain from OA. Currently on Meloxicam 7.5 mg daily as well as Voltaren gel. She reports that her pain is not adequately controlled on this regimen currently. Patient is morbidly obese with a BMI of 43. Weight has been stable.   Agreeable to continuing her current medications today until she is able to see her PCP for follow up. Discussed the need for weight loss.   Plan: Refill mobic today. Continue voltaren gel.  Can consider increase in dose of the Mobic to 10 mg. Reports RA as well though do not see any documentation to support this. Strongly advised weight loss.

## 2017-03-21 NOTE — Progress Notes (Signed)
   CC: HTN follow up  HPI:  [name] is a 61 y.o. female with a past medical history listed below here today for follow up of her HTN.   For details of today's visit and the status of her chronic medical issues please refer to the assessment and plan.  Past Medical History:  Diagnosis Date  . Hypertension     Review of Systems:   See HPI  Physical Exam:  Vitals:   03/21/17 1020 03/21/17 1043  BP: (!) 133/114 132/63  Pulse: 64 (!) 56  Temp: 98.4 F (36.9 C)   TempSrc: Oral   SpO2: 100%   Weight: 292 lb 8 oz (132.7 kg)    Physical Exam  Constitutional: She is well-developed, well-nourished, and in no distress.  Cardiovascular: Normal rate and regular rhythm.   Pulmonary/Chest: Effort normal and breath sounds normal.  Vitals reviewed.    Assessment & Plan:   See Encounters Tab for problem based charting.  Patient discussed with Dr. Lynnae January

## 2017-03-21 NOTE — Progress Notes (Signed)
Internal Medicine Clinic Attending  Case discussed with Dr. Boswell at the time of the visit.  We reviewed the resident's history and exam and pertinent patient test results.  I agree with the assessment, diagnosis, and plan of care documented in the resident's note.  

## 2017-03-21 NOTE — Assessment & Plan Note (Signed)
BP Readings from Last 3 Encounters:  03/21/17 132/63  07/19/16 135/67  04/17/16 125/65    Lab Results  Component Value Date   NA 142 01/11/2016   K 4.0 01/11/2016   CREATININE 1.05 (H) 01/11/2016   BP today is 132/63. She is currently on metoprolol 50 m gbid and HCTZ 25 mg daily. Reports adherence today. Has not been seen in some time and was told she needed to be seen for refills. Appears she missed her most recent appointment with her PCP, Dr. Zada Finders, in February. Reports that she wants to have a doctor she 'can relate to better' and is now establishing with Dr. Dareen Piano.  Denies any headaches, vision changes, palpitations, chest pain today. Does report feeling dizzy when she takes her metoprolol since her pharmacy changed the supplier. Dizziness is not related to position. Only occurs immediately after taking the metoprolol and resolves on its own. No symptoms at any other time.   Assessment: HTN, stable  Plan: Continue current medications. If symptoms with taking the metoprolol continue can consider change in formulation or different BB.

## 2017-05-29 ENCOUNTER — Encounter: Payer: Self-pay | Admitting: Internal Medicine

## 2017-05-29 ENCOUNTER — Ambulatory Visit (INDEPENDENT_AMBULATORY_CARE_PROVIDER_SITE_OTHER): Payer: Medicaid Other | Admitting: Internal Medicine

## 2017-05-29 VITALS — BP 142/68 | HR 62 | Temp 98.4°F | Ht 69.0 in | Wt 284.6 lb

## 2017-05-29 DIAGNOSIS — Z8719 Personal history of other diseases of the digestive system: Secondary | ICD-10-CM | POA: Diagnosis not present

## 2017-05-29 DIAGNOSIS — M1990 Unspecified osteoarthritis, unspecified site: Secondary | ICD-10-CM

## 2017-05-29 DIAGNOSIS — K5720 Diverticulitis of large intestine with perforation and abscess without bleeding: Secondary | ICD-10-CM

## 2017-05-29 DIAGNOSIS — K625 Hemorrhage of anus and rectum: Secondary | ICD-10-CM | POA: Insufficient documentation

## 2017-05-29 DIAGNOSIS — M150 Primary generalized (osteo)arthritis: Secondary | ICD-10-CM | POA: Diagnosis not present

## 2017-05-29 DIAGNOSIS — I10 Essential (primary) hypertension: Secondary | ICD-10-CM

## 2017-05-29 DIAGNOSIS — Z0000 Encounter for general adult medical examination without abnormal findings: Secondary | ICD-10-CM

## 2017-05-29 DIAGNOSIS — Z933 Colostomy status: Secondary | ICD-10-CM

## 2017-05-29 DIAGNOSIS — Z9049 Acquired absence of other specified parts of digestive tract: Secondary | ICD-10-CM | POA: Diagnosis not present

## 2017-05-29 DIAGNOSIS — Z87891 Personal history of nicotine dependence: Secondary | ICD-10-CM | POA: Diagnosis not present

## 2017-05-29 DIAGNOSIS — Z79899 Other long term (current) drug therapy: Secondary | ICD-10-CM

## 2017-05-29 NOTE — Assessment & Plan Note (Signed)
-   Patient states that over the last year and a half she has had intermittent rectal bleeding which is mild in nature - Rectal exam done and showed no active bleeding, no stool and no visible hemorrhoids - We'll have patient follow-up with Dr. Dalbert Batman who is her surgeon

## 2017-05-29 NOTE — Assessment & Plan Note (Signed)
BP Readings from Last 3 Encounters:  05/29/17 (!) 142/68  03/21/17 132/63  07/19/16 135/67    Lab Results  Component Value Date   NA 142 01/11/2016   K 4.0 01/11/2016   CREATININE 1.05 (H) 01/11/2016    Assessment: Blood pressure control:  fair Progress toward BP goal:   deteriorated Comments: Patient is compliant with her medications (metoprolol 25 mg twice a day as well as hydrochlorothiazide 25 mg daily   Plan: Medications:  continue current medications Educational resources provided:   Self management tools provided:   Other plans: We'll check BMP today

## 2017-05-29 NOTE — Assessment & Plan Note (Signed)
-   Patient states that she's been having intermittent bleeding from her rectum as well as some intermittent bleeding into the ostomy bag - Rectal exam done today showed no stool and no blood and no visible hemorrhoids - We'll have patient follow-up with Dr. Dalbert Batman who is her surgeon - We'll check CBC today

## 2017-05-29 NOTE — Progress Notes (Signed)
   Subjective:    Patient ID: Deanna Aguilar, female    DOB: 1956/06/07, 61 y.o.   MRN: [id number]  HPI I have seen and examined this patient. She is here for routine follow-up of her hypertension and arthritis.  Patient appears very emotional today and states that she has been having issues since her surgery 2 years ago. Patient states that she has some skin breakdown around the ostomy site and has difficulty using the ostomy bag. She also complains of some mild bleeding intermittently from the ostomy site as well as some mild intermittent rectal bleeding.  Patient also states that she has been having some personal issues as well as difficulty in the place where she currently lives and is planning to move soon. She states she does not have help at home and that her sister is coming to visit her in one week and help her.    Review of Systems  Constitutional: Negative.   HENT: Negative.   Respiratory: Negative.   Cardiovascular: Negative.   Gastrointestinal: Positive for anal bleeding.  Musculoskeletal: Positive for arthralgias and back pain.  Skin: Negative.   Neurological: Negative.   Psychiatric/Behavioral: Negative.        Objective:   Physical Exam  Constitutional: She is oriented to person, place, and time. She appears well-developed and well-nourished.  HENT:  Head: Normocephalic and atraumatic.  Mouth/Throat: No oropharyngeal exudate.  Neck: Neck supple.  Cardiovascular: Normal rate, regular rhythm and normal heart sounds.   Pulmonary/Chest: Breath sounds normal. No respiratory distress. She has no wheezes.  Abdominal: Soft. Bowel sounds are normal. She exhibits no distension. There is no tenderness.  Musculoskeletal: Normal range of motion. She exhibits no edema.  Lymphadenopathy:    She has no cervical adenopathy.  Neurological: She is alert and oriented to person, place, and time.  Skin: Skin is warm. No erythema.  Psychiatric: She has a normal mood and affect. Her  behavior is normal.          Assessment & Plan:  Please see problem-based charting for assessment and plan

## 2017-05-29 NOTE — Assessment & Plan Note (Signed)
-   Colostomy site appears clean - There is no visible skin breakdown nor active bleeding into the bag - We'll have patient follow-up with her surgeon Dr. Dalbert Batman for further evaluation

## 2017-05-29 NOTE — Patient Instructions (Addendum)
-   It was a pleasure seeing you today - Please follow up with me in 3 months - We will refer you to PT for your back pain - Please follow up with Dr. Dalbert Batman for your rectal bleeding - Continue with your BP medications

## 2017-05-29 NOTE — Assessment & Plan Note (Signed)
-   Patient does not want to discuss a Pap smear or mammogram at this time. We'll revisit this issue with her on her next visit - We'll check hepatitis C antibodies today

## 2017-05-29 NOTE — Assessment & Plan Note (Signed)
-  Patient complains of arthralgias in her knees as well as her fingers and her back - States that the pain is especially worse in her lower back as well as knees and that meloxicam is not working for her - She does state that Aleve helps her pain but she was instructed not to take this - I advised her not to take meloxicam and Aleve together but that she could use Aleve sparingly  - I will also refer her to physical therapy for her back pain

## 2017-05-30 ENCOUNTER — Telehealth: Payer: Self-pay | Admitting: Internal Medicine

## 2017-05-30 LAB — BMP8+ANION GAP
ANION GAP: 18 mmol/L (ref 10.0–18.0)
BUN/Creatinine Ratio: 24 — ABNORMAL HIGH (ref 9–23)
BUN: 22 mg/dL (ref 6–24)
CALCIUM: 9.3 mg/dL (ref 8.7–10.2)
CO2: 20 mmol/L (ref 20–29)
CREATININE: 0.91 mg/dL (ref 0.57–1.00)
Chloride: 102 mmol/L (ref 96–106)
GFR calc Af Amer: 80 mL/min/{1.73_m2} (ref >59)
GFR, EST NON AFRICAN AMERICAN: 69 mL/min/{1.73_m2} (ref >59)
Glucose: 109 mg/dL — ABNORMAL HIGH (ref 65–99)
Potassium: 4.1 mmol/L (ref 3.5–5.2)
Sodium: 140 mmol/L (ref 134–144)

## 2017-05-30 LAB — CBC WITH DIFFERENTIAL/PLATELET
BASOS: 0 %
Basophils Absolute: 0 10*3/uL (ref 0.0–0.2)
EOS (ABSOLUTE): 0.2 10*3/uL (ref 0.0–0.4)
Eos: 2 %
HEMATOCRIT: 36.8 % (ref 34.0–46.6)
Hemoglobin: 11.9 g/dL (ref 11.1–15.9)
Immature Grans (Abs): 0 10*3/uL (ref 0.0–0.1)
Immature Granulocytes: 0 %
Lymphocytes Absolute: 2.7 10*3/uL (ref 0.7–3.1)
Lymphs: 28 %
MCH: 28.3 pg (ref 26.6–33.0)
MCHC: 32.3 g/dL (ref 31.5–35.7)
MCV: 87 fL (ref 79–97)
Monocytes Absolute: 0.9 10*3/uL (ref 0.1–0.9)
Monocytes: 9 %
NEUTROS ABS: 5.8 10*3/uL (ref 1.4–7.0)
Neutrophils: 61 %
PLATELETS: 383 10*3/uL — AB (ref 150–379)
RBC: 4.21 x10E6/uL (ref 3.77–5.28)
RDW: 14.4 % (ref 12.3–15.4)
WBC: 9.6 10*3/uL (ref 3.4–10.8)

## 2017-05-30 LAB — LIPID PANEL
CHOL/HDL RATIO: 2.5 ratio (ref 0.0–4.4)
Cholesterol, Total: 170 mg/dL (ref 100–199)
HDL: 67 mg/dL (ref >39)
LDL Calculated: 83 mg/dL (ref 0–99)
TRIGLYCERIDES: 99 mg/dL (ref 0–149)
VLDL CHOLESTEROL CAL: 20 mg/dL (ref 5–40)

## 2017-05-30 LAB — HEPATITIS C ANTIBODY: Hep C Virus Ab: <0.1 s/co ratio (ref 0.0–0.9)

## 2017-05-30 NOTE — Telephone Encounter (Signed)
I called the patient to discuss the results of her blood work. CBC was wnl except for a mildly elevated platelet count and BMP was wnl except for a mildly elevated blood glucose. Hep C ab was negative and lipid profile was good as well. No further work up for now. Patient expresses understanding and is in agreement with plan

## 2017-06-13 ENCOUNTER — Encounter: Payer: Self-pay | Admitting: *Deleted

## 2017-07-16 NOTE — Addendum Note (Signed)
Addended by: Hulan Fray on: 07/16/2017 06:36 PM   Modules accepted: Orders

## 2017-12-22 ENCOUNTER — Other Ambulatory Visit: Payer: Self-pay

## 2017-12-22 ENCOUNTER — Encounter (HOSPITAL_COMMUNITY): Payer: Self-pay

## 2017-12-22 ENCOUNTER — Emergency Department (HOSPITAL_COMMUNITY)
Admission: EM | Admit: 2017-12-22 | Discharge: 2017-12-23 | Disposition: A | Discharge status: Other Acute Inpatient Hospital | Payer: Medicaid Other | Attending: Emergency Medicine | Admitting: Emergency Medicine

## 2017-12-22 DIAGNOSIS — K922 Gastrointestinal hemorrhage, unspecified: Secondary | ICD-10-CM

## 2017-12-22 DIAGNOSIS — Z933 Colostomy status: Secondary | ICD-10-CM | POA: Insufficient documentation

## 2017-12-22 DIAGNOSIS — Z046 Encounter for general psychiatric examination, requested by authority: Secondary | ICD-10-CM | POA: Diagnosis present

## 2017-12-22 DIAGNOSIS — Z008 Encounter for other general examination: Secondary | ICD-10-CM

## 2017-12-22 DIAGNOSIS — I10 Essential (primary) hypertension: Secondary | ICD-10-CM | POA: Diagnosis not present

## 2017-12-22 DIAGNOSIS — Z87891 Personal history of nicotine dependence: Secondary | ICD-10-CM | POA: Diagnosis not present

## 2017-12-22 DIAGNOSIS — Z79899 Other long term (current) drug therapy: Secondary | ICD-10-CM | POA: Diagnosis not present

## 2017-12-22 DIAGNOSIS — K625 Hemorrhage of anus and rectum: Secondary | ICD-10-CM | POA: Insufficient documentation

## 2017-12-22 LAB — URINALYSIS, ROUTINE W REFLEX MICROSCOPIC
Bilirubin Urine: NEGATIVE
GLUCOSE, UA: NEGATIVE mg/dL
Hgb urine dipstick: NEGATIVE
KETONES UR: NEGATIVE mg/dL
Nitrite: NEGATIVE
PROTEIN: NEGATIVE mg/dL
Specific Gravity, Urine: 1.025 (ref 1.005–1.030)
pH: 5 (ref 5.0–8.0)

## 2017-12-22 LAB — CBC
HEMATOCRIT: 34.9 % — AB (ref 36.0–46.0)
HEMOGLOBIN: 11.2 g/dL — AB (ref 12.0–15.0)
MCH: 27.2 pg (ref 26.0–34.0)
MCHC: 32.1 g/dL (ref 30.0–36.0)
MCV: 84.7 fL (ref 78.0–100.0)
Platelets: 445 10*3/uL — ABNORMAL HIGH (ref 150–400)
RBC: 4.12 MIL/uL (ref 3.87–5.11)
RDW: 14.1 % (ref 11.5–15.5)
WBC: 10.4 10*3/uL (ref 4.0–10.5)

## 2017-12-22 LAB — LIPASE, BLOOD: LIPASE: 24 U/L (ref 11–51)

## 2017-12-22 LAB — COMPREHENSIVE METABOLIC PANEL
ALBUMIN: 3.5 g/dL (ref 3.5–5.0)
ALT: 11 U/L — ABNORMAL LOW (ref 14–54)
ANION GAP: 10 (ref 5–15)
AST: 15 U/L (ref 15–41)
Alkaline Phosphatase: 74 U/L (ref 38–126)
BUN: 27 mg/dL — ABNORMAL HIGH (ref 6–20)
CHLORIDE: 103 mmol/L (ref 101–111)
CO2: 23 mmol/L (ref 22–32)
Calcium: 8.8 mg/dL — ABNORMAL LOW (ref 8.9–10.3)
Creatinine, Ser: 1.13 mg/dL — ABNORMAL HIGH (ref 0.44–1.00)
GFR calc non Af Amer: 52 mL/min — ABNORMAL LOW (ref >60)
GFR, EST AFRICAN AMERICAN: 60 mL/min — AB (ref >60)
GLUCOSE: 116 mg/dL — AB (ref 65–99)
POTASSIUM: 3.2 mmol/L — AB (ref 3.5–5.1)
SODIUM: 136 mmol/L (ref 135–145)
Total Bilirubin: 0.8 mg/dL (ref 0.3–1.2)
Total Protein: 8 g/dL (ref 6.5–8.1)

## 2017-12-22 MED ORDER — POTASSIUM CHLORIDE CRYS ER 20 MEQ PO TBCR
40.0000 meq | EXTENDED_RELEASE_TABLET | Freq: Once | ORAL | Status: AC
  Administered 2017-12-22: 40 meq via ORAL
  Filled 2017-12-22: qty 2

## 2017-12-22 MED ORDER — ACETAMINOPHEN 325 MG PO TABS
650.0000 mg | ORAL_TABLET | ORAL | Status: DC | PRN
Start: 2017-12-22 — End: 2017-12-23

## 2017-12-22 MED ORDER — ZIPRASIDONE MESYLATE 20 MG IM SOLR: 20.0000 mg | Freq: Two times a day (BID) | INTRAMUSCULAR | Status: DC | PRN

## 2017-12-22 MED ORDER — LORAZEPAM 1 MG PO TABS: 1.0000 mg | ORAL_TABLET | ORAL | Status: DC | PRN

## 2017-12-22 MED ORDER — ZOLPIDEM TARTRATE 5 MG PO TABS: 5.0000 mg | ORAL_TABLET | Freq: Every evening | ORAL | Status: DC | PRN

## 2017-12-22 MED ORDER — ONDANSETRON HCL 4 MG PO TABS: 4.0000 mg | ORAL_TABLET | Freq: Three times a day (TID) | ORAL | Status: DC | PRN

## 2017-12-22 MED ORDER — MAGNESIUM OXIDE 400 (241.3 MG) MG PO TABS
800.0000 mg | ORAL_TABLET | Freq: Once | ORAL | Status: AC
  Administered 2017-12-22: 800 mg via ORAL
  Filled 2017-12-22: qty 2

## 2017-12-22 NOTE — ED Triage Notes (Signed)
Pt reports rectal bleeding and mucous-like discharge from her rectum for the last several weeks. She has a colostomy that she reports is functioning normally. She was sent for medical clearance from [facility]. She reports going to [facility] because she is dealing with a lot of "stress and changes." [facility] states that after medical clearance, she can come back to them. A&Ox4.

## 2017-12-22 NOTE — ED Notes (Signed)
PT stated that she is having pain due to her RA. She states that she has this pain all the time however today because of the cold it is worse.

## 2017-12-23 NOTE — Discharge Instructions (Signed)
Please see your primary care doctor for further evaluation of the bloody stools / mucusy stools. We suspect that you might need further evaluation, and the primary care doctor can help you get a referral to a specialist. Please take good care of yourself.

## 2017-12-23 NOTE — ED Notes (Addendum)
PT friend will be transporting the pt to [facility].

## 2017-12-23 NOTE — ED Provider Notes (Signed)
[place] DEPT Provider Note   CSN: [id number] Arrival date & time: 12/22/17  1821     History   Chief Complaint Chief Complaint  Patient presents with  . Medical Clearance  . Rectal Bleeding    HPI Deanna Aguilar is a 62 y.o. female.  HPI 62 year old female with history of diverticular disease status post ostomy bag placement in 2016, paroxysmal A. fib not on any blood thinners, morbid obesity comes in with chief complaint of rectal bleeding and medical clearance.  Patient does not have any known psychiatric condition.  Patient reports that since her surgery in 2016 she has felt depressed.  Patient has had difficulty coping with her diagnosis, and living with her diagnosis.  She has not reported her grief to anyone else.  She has now lost her house because of some other circumstances, and her depression is worsened. She was seen by [facility] earlier today and advised to come to the ER for medical clearance.  Patient is not having any SI/HI at this time.  She does report intermittent hearing voices.  Patient reports that about 6 months after the surgery she started having mucousy discharge from her rectum.  Patient does not have any associated abdominal or rectal pain.  Patient still wears her ostomy bag which does not have any bloody stools.  Past Medical History:  Diagnosis Date  . Hypertension     Patient Active Problem List   Diagnosis Date Noted  . Rectal bleeding 05/29/2017  . Insomnia 02/21/2016  . Preventative health care 01/11/2016  . PAF (paroxysmal atrial fibrillation) ([place]) 08/18/2015  . Essential hypertension 08/18/2015  . Depression 06/13/2015  . 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 Morbid obesity ([place]) 06/10/2015  . Arthritis- on disability 06/10/2015  . Diverticulitis of colon with perforation s/p colectomy/ostomy 06/09/2015 06/07/2015    Past Surgical History:  Procedure Laterality Date  . COLON RESECTION N/A 06/09/2015   Procedure: SIGMOID COLECTOMY, COLOSTOMY, TAKE DOWN SPLENIC FLECTURE;  Surgeon: Fanny Skates, MD;  Location: [facility] ORS;  Service: General;  Laterality: N/A;  . TUBAL LIGATION      OB History    No data available       Home Medications    Prior to Admission medications   Medication Sig Start Date End Date Taking? Authorizing Provider  hydrochlorothiazide (HYDRODIURIL) 25 MG tablet Take 1 tablet (25 mg total) by mouth daily. 03/21/17  Yes Maryellen Pile, MD  metoprolol tartrate (LOPRESSOR) 25 MG tablet Take 2 tablets (50 mg total) by mouth 2 (two) times daily. 03/21/17 03/21/18 Yes Maryellen Pile, MD  Ostomy Supplies (ADHESIVE REMOVER WIPES) MISC 1 Units by Does not apply route daily. 01/19/16  Yes Emokpae, Leanne Chang, MD  Ostomy Supplies (OSTOMY DRAINABLE POUCH/FLANGE) MISC 1 Units by Does not apply route daily. 01/19/16  Yes Emokpae, Leanne Chang, MD  Ostomy Supplies (SECURI-T OSTOMY DEODORANT) LIQD 1 Units by Does not apply route daily. 01/19/16  Yes Emokpae, Leanne Chang, MD  Ostomy Supplies (SKIN PREP WIPES) MISC 1 Units by Does not apply route daily. 01/19/16  Yes Emokpae, Ejiroghene E, MD  diclofenac sodium (VOLTAREN) 1 % GEL Apply 4 g topically 4 (four) times daily. Patient not taking: Reported on 12/22/2017 07/19/16   Zada Finders, MD  meloxicam (MOBIC) 7.5 MG tablet Take 1 tablet (7.5 mg total) by mouth daily. Take no more than 7 days at a time for arthritis flares. Wait at a week between each course. Patient not taking: Reported on 12/22/2017  03/21/17   Maryellen Pile, MD    Family History Family History  Problem Relation Age of Onset  . Heart disease Mother   . Heart attack Mother   . Hypertension Mother   . Other Maternal Grandmother        ovarian issues    Social History Social History   Tobacco Use  . Smoking status: Former Research scientist (life sciences)  . Tobacco comment: QUIT SMOKING ABOUT 18 YEARS AGO(PER PATIENT)  Substance Use Topics  . Alcohol use: No    Alcohol/week: 0.0 oz  .  Drug use: No     Allergies   Patient has no known allergies.   Review of Systems Review of Systems  Constitutional: Positive for activity change.  Gastrointestinal: Negative for abdominal pain.  Allergic/Immunologic: Negative for immunocompromised state.  Hematological: Does not bruise/bleed easily.  Psychiatric/Behavioral: Negative for suicidal ideas.  All other systems reviewed and are negative.    Physical Exam Updated Vital Signs BP 135/61 (BP Location: Left Arm)   Pulse (!) 59   Temp 98.6 F (37 C) (Oral)   Resp 16   Ht 5\' 9"  (1.753 m)   Wt 125.6 kg (277 lb)   SpO2 99%   BMI 40.91 kg/m   Physical Exam  Constitutional: She is oriented to person, place, and time. She appears well-developed.  HENT:  Head: Normocephalic and atraumatic.  Eyes: EOM are normal.  Neck: Normal range of motion. Neck supple.  Cardiovascular: Normal rate.  Pulmonary/Chest: Effort normal.  Abdominal: Bowel sounds are normal. There is no tenderness.  Neurological: She is alert and oriented to person, place, and time.  Skin: Skin is warm and dry.  Psychiatric:  Labile mood  Nursing note and vitals reviewed.    ED Treatments / Results  Labs (all labs ordered are listed, but only abnormal results are displayed) Labs Reviewed  COMPREHENSIVE METABOLIC PANEL - Abnormal; Notable for the following components:      Result Value   Potassium 3.2 (*)    Glucose, Bld 116 (*)    BUN 27 (*)    Creatinine, Ser 1.13 (*)    Calcium 8.8 (*)    ALT 11 (*)    GFR calc non Af Amer 52 (*)    GFR calc Af Amer 60 (*)    All other components within normal limits  CBC - Abnormal; Notable for the following components:   Hemoglobin 11.2 (*)    HCT 34.9 (*)    Platelets 445 (*)    All other components within normal limits  URINALYSIS, ROUTINE W REFLEX MICROSCOPIC - Abnormal; Notable for the following components:   Leukocytes, UA MODERATE (*)    Bacteria, UA RARE (*)    Squamous Epithelial / LPF 0-5  (*)    All other components within normal limits  LIPASE, BLOOD  I-STAT BETA HCG BLOOD, ED ([facility], [facility], [facility] ONLY)    EKG  EKG Interpretation None       Radiology No results found.  Procedures Procedures (including critical care time)  Medications Ordered in ED Medications  ziprasidone (GEODON) injection 20 mg (not administered)    And  LORazepam (ATIVAN) tablet 1 mg (not administered)  acetaminophen (TYLENOL) tablet 650 mg (not administered)  zolpidem (AMBIEN) tablet 5 mg (not administered)  ondansetron (ZOFRAN) tablet 4 mg (not administered)  potassium chloride SA (K-DUR,KLOR-CON) CR tablet 40 mEq (40 mEq Oral Given 12/22/17 2353)  magnesium oxide (MAG-OX) tablet 800 mg (800 mg Oral Given 12/22/17 2353)  Initial Impression / Assessment and Plan / ED Course  I have reviewed the triage vital signs and the nursing notes.  Pertinent labs & imaging results that were available during my care of the patient were reviewed by me and considered in my medical decision making (see chart for details).     Patient comes to the ER with chief complaint of rectal bleed and medical clearance.  Patient has no known psychiatric history.  She had an ostomy placement in 2023 after complications from diverticulitis, and since then she has been having difficulty coping with her diagnosis and condition.  It appears that due to several circumstances, her depression has gotten worse.  Patient does not actively have SI/HI, but he reports that intermittently she has been hearing voices.  Patient was seen by [facility] earlier today and they had recommended that patient get admitted.  She was sent here for medical clearance.  As far as the rectal bleeding is concerned, it appears that patient is having mucousy drainage with some intermittent sloughing/blood in it.  Her symptoms have been ongoing for at least 2 years now, with stable hemoglobin.  Patient has no abdominal tenderness.  I suspect that she needs GI  to further assess this matter electively.  Patient has not seen primary care doctor in more than 6 months, we have advised that she follow-up with them to see if GI assessment is needed.  Final Clinical Impressions(s) / ED Diagnoses   Final diagnoses:  Medical clearance for psychiatric admission  Lower GI bleed    ED Discharge Orders    None       Varney Biles, MD 12/23/17 [phone]

## 2017-12-24 ENCOUNTER — Encounter (HOSPITAL_COMMUNITY): Payer: Self-pay | Admitting: Emergency Medicine

## 2017-12-24 ENCOUNTER — Emergency Department (HOSPITAL_COMMUNITY)
Admission: EM | Admit: 2017-12-24 | Discharge: 2017-12-24 | Disposition: A | Discharge status: Home / Self Care | Payer: Medicaid Other | Attending: Emergency Medicine | Admitting: Emergency Medicine

## 2017-12-24 ENCOUNTER — Emergency Department (HOSPITAL_COMMUNITY): Payer: Medicaid Other

## 2017-12-24 ENCOUNTER — Other Ambulatory Visit: Payer: Self-pay

## 2017-12-24 DIAGNOSIS — I10 Essential (primary) hypertension: Secondary | ICD-10-CM | POA: Insufficient documentation

## 2017-12-24 DIAGNOSIS — Z79899 Other long term (current) drug therapy: Secondary | ICD-10-CM | POA: Insufficient documentation

## 2017-12-24 DIAGNOSIS — R51 Headache: Secondary | ICD-10-CM | POA: Diagnosis not present

## 2017-12-24 DIAGNOSIS — Z87891 Personal history of nicotine dependence: Secondary | ICD-10-CM | POA: Diagnosis not present

## 2017-12-24 DIAGNOSIS — F3289 Other specified depressive episodes: Secondary | ICD-10-CM | POA: Diagnosis not present

## 2017-12-24 HISTORY — DX: Unspecified osteoarthritis, unspecified site: M19.90

## 2017-12-24 HISTORY — DX: Depression, unspecified: F32.A

## 2017-12-24 HISTORY — DX: Major depressive disorder, single episode, unspecified: F32.9

## 2017-12-24 LAB — URINALYSIS, ROUTINE W REFLEX MICROSCOPIC
Bilirubin Urine: NEGATIVE
Glucose, UA: NEGATIVE mg/dL
Hgb urine dipstick: NEGATIVE
Ketones, ur: NEGATIVE mg/dL
Nitrite: NEGATIVE
Protein, ur: NEGATIVE mg/dL
Specific Gravity, Urine: 1.018 (ref 1.005–1.030)
pH: 5 (ref 5.0–8.0)

## 2017-12-24 LAB — CBC WITH DIFFERENTIAL/PLATELET
Basophils Absolute: 0 10*3/uL (ref 0.0–0.1)
Basophils Relative: 0 %
Eosinophils Absolute: 0.3 10*3/uL (ref 0.0–0.7)
Eosinophils Relative: 3 %
HCT: 37.2 % (ref 36.0–46.0)
Hemoglobin: 11.9 g/dL — ABNORMAL LOW (ref 12.0–15.0)
Lymphocytes Relative: 33 %
Lymphs Abs: 3.1 10*3/uL (ref 0.7–4.0)
MCH: 27.6 pg (ref 26.0–34.0)
MCHC: 32 g/dL (ref 30.0–36.0)
MCV: 86.3 fL (ref 78.0–100.0)
Monocytes Absolute: 0.8 10*3/uL (ref 0.1–1.0)
Monocytes Relative: 9 %
Neutro Abs: 5.2 10*3/uL (ref 1.7–7.7)
Neutrophils Relative %: 55 %
Platelets: 396 10*3/uL (ref 150–400)
RBC: 4.31 MIL/uL (ref 3.87–5.11)
RDW: 14.3 % (ref 11.5–15.5)
WBC: 9.5 10*3/uL (ref 4.0–10.5)

## 2017-12-24 LAB — BASIC METABOLIC PANEL
Anion gap: 12 (ref 5–15)
BUN: 16 mg/dL (ref 6–20)
CO2: 23 mmol/L (ref 22–32)
Calcium: 9 mg/dL (ref 8.9–10.3)
Chloride: 100 mmol/L — ABNORMAL LOW (ref 101–111)
Creatinine, Ser: 1.19 mg/dL — ABNORMAL HIGH (ref 0.44–1.00)
GFR calc Af Amer: 56 mL/min — ABNORMAL LOW (ref >60)
GFR calc non Af Amer: 49 mL/min — ABNORMAL LOW (ref >60)
Glucose, Bld: 92 mg/dL (ref 65–99)
Potassium: 3.2 mmol/L — ABNORMAL LOW (ref 3.5–5.1)
Sodium: 135 mmol/L (ref 135–145)

## 2017-12-24 LAB — T4, FREE: Free T4: 0.83 ng/dL (ref 0.61–1.12)

## 2017-12-24 LAB — RAPID HIV SCREEN (HIV 1/2 AB+AG)
HIV 1/2 Antibodies: NONREACTIVE
HIV-1 P24 Antigen - HIV24: NONREACTIVE

## 2017-12-24 LAB — SEDIMENTATION RATE: Sed Rate: 62 mm/hr — ABNORMAL HIGH (ref 0–22)

## 2017-12-24 LAB — TSH: TSH: 1.707 u[IU]/mL (ref 0.350–4.500)

## 2017-12-24 LAB — VITAMIN B12: Vitamin B-12: 376 pg/mL (ref 180–914)

## 2017-12-24 LAB — FOLATE: Folate: 6.6 ng/mL (ref >5.9)

## 2017-12-24 NOTE — ED Notes (Signed)
Talked to nurse at [facility]. Informed RN that EEG, MRI, and PP panel are not things that can be performed in the ED today. The rest of the labs will be drawn, some labs will result today and other will take 24 hours.  Deanna Aguilar is ok with accepting the Pt back after labs are drawn.

## 2017-12-24 NOTE — ED Triage Notes (Signed)
PT being treated at [facility] for depression. Sent here by them to have labs drawn, has a list of labs they would like drawn (some unfamiliar to triage RN). "Is to return on own to [facility] after labs drawn/resulted"

## 2017-12-24 NOTE — Discharge Instructions (Signed)
Return here as needed.  Follow-up at [facility]

## 2017-12-24 NOTE — ED Provider Notes (Signed)
[place] EMERGENCY DEPARTMENT Provider Note   CSN: [id number] Arrival date & time: 12/24/17  9628     History   Chief Complaint Chief Complaint  Patient presents with  . [facility] sent for labs    HPI Deanna Aguilar is a 62 y.o. female.  HPI Patient presents to the emergency department with need for medical clearance labs to be drawn.  The patient was sent from [facility] for clearance for psychiatric care.  They requested certain blood test be drawn.  Patient states that she has been having increasing depression over the last few weeks.  She states she is not suicidal homicidal at this point.  The patient denies chest pain, shortness of breath, headache,blurred vision, neck pain, fever, cough, weakness, numbness, dizziness, anorexia, edema, abdominal pain, nausea, vomiting, diarrhea, rash, back pain, dysuria, hematemesis, bloody stool, near syncope, or syncope. Past Medical History:  Diagnosis Date  . Arthritis   . Depression   . Hypertension     Patient Active Problem List   Diagnosis Date Noted  . Rectal bleeding 05/29/2017  . Insomnia 02/21/2016  . Preventative health care 01/11/2016  . PAF (paroxysmal atrial fibrillation) ([place]) 08/18/2015  . Essential hypertension 08/18/2015  . Depression 06/13/2015  . 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 Morbid obesity ([place]) 06/10/2015  . Arthritis- on disability 06/10/2015  . Diverticulitis of colon with perforation s/p colectomy/ostomy 06/09/2015 06/07/2015    Past Surgical History:  Procedure Laterality Date  . COLON RESECTION N/A 06/09/2015   Procedure: SIGMOID COLECTOMY, COLOSTOMY, TAKE DOWN SPLENIC FLECTURE;  Surgeon: Fanny Skates, MD;  Location: [facility]S;  Service: General;  Laterality: N/A;  . TUBAL LIGATION      OB History    No data available       Home Medications    Prior to Admission medications   Medication Sig Start Date End Date Taking? Authorizing Provider  diclofenac sodium (VOLTAREN) 1  % GEL Apply 4 g topically 4 (four) times daily. Patient not taking: Reported on 12/22/2017 07/19/16   Zada Finders, MD  hydrochlorothiazide (HYDRODIURIL) 25 MG tablet Take 1 tablet (25 mg total) by mouth daily. 03/21/17   Maryellen Pile, MD  meloxicam (MOBIC) 7.5 MG tablet Take 1 tablet (7.5 mg total) by mouth daily. Take no more than 7 days at a time for arthritis flares. Wait at a week between each course. Patient not taking: Reported on 12/22/2017 03/21/17   Maryellen Pile, MD  metoprolol tartrate (LOPRESSOR) 25 MG tablet Take 2 tablets (50 mg total) by mouth 2 (two) times daily. 03/21/17 03/21/18  Maryellen Pile, MD  Ostomy Supplies (ADHESIVE REMOVER WIPES) MISC 1 Units by Does not apply route daily. 01/19/16   Emokpae, Leanne Chang, MD  Ostomy Supplies (OSTOMY DRAINABLE POUCH/FLANGE) MISC 1 Units by Does not apply route daily. 01/19/16   Emokpae, Leanne Chang, MD  Ostomy Supplies (SECURI-T OSTOMY DEODORANT) LIQD 1 Units by Does not apply route daily. 01/19/16   Emokpae, Leanne Chang, MD  Ostomy Supplies (SKIN PREP WIPES) MISC 1 Units by Does not apply route daily. 01/19/16   Bethena Roys, MD    Family History Family History  Problem Relation Age of Onset  . Heart disease Mother   . Heart attack Mother   . Hypertension Mother   . Other Maternal Grandmother        ovarian issues    Social History Social History   Tobacco Use  . Smoking status: Former Research scientist (life sciences)  . Tobacco comment:  QUIT SMOKING ABOUT 18 YEARS AGO(PER PATIENT)  Substance Use Topics  . Alcohol use: No    Alcohol/week: 0.0 oz  . Drug use: No     Allergies   Patient has no known allergies.   Review of Systems Review of Systems All other systems negative except as documented in the HPI. All pertinent positives and negatives as reviewed in the HPI.  Physical Exam Updated Vital Signs BP (!) 146/62 (BP Location: Right Arm)   Pulse 73   Temp 98.8 F (37.1 C) (Oral)   Resp 18   Ht 5\' 6"  (1.676 m)   Wt 125.6  kg (277 lb)   SpO2 100%   BMI 44.71 kg/m   Physical Exam  Constitutional: She is oriented to person, place, and time. She appears well-developed and well-nourished. No distress.  HENT:  Head: Normocephalic and atraumatic.  Mouth/Throat: Oropharynx is clear and moist.  Eyes: Pupils are equal, round, and reactive to light.  Neck: Normal range of motion. Neck supple.  Cardiovascular: Normal rate, regular rhythm and normal heart sounds. Exam reveals no gallop and no friction rub.  No murmur heard. Pulmonary/Chest: Effort normal and breath sounds normal. No respiratory distress. She has no wheezes.  Neurological: She is alert and oriented to person, place, and time. No sensory deficit. She exhibits normal muscle tone. Coordination normal.  Skin: Skin is warm and dry. Capillary refill takes less than 2 seconds. No rash noted. No erythema.  Psychiatric: She has a normal mood and affect. Her behavior is normal.  Nursing note and vitals reviewed.    ED Treatments / Results  Labs (all labs ordered are listed, but only abnormal results are displayed) Labs Reviewed  BASIC METABOLIC PANEL - Abnormal; Notable for the following components:      Result Value   Potassium 3.2 (*)    Chloride 100 (*)    Creatinine, Ser 1.19 (*)    GFR calc non Af Amer 49 (*)    GFR calc Af Amer 56 (*)    All other components within normal limits  CBC WITH DIFFERENTIAL/PLATELET - Abnormal; Notable for the following components:   Hemoglobin 11.9 (*)    All other components within normal limits  URINALYSIS, ROUTINE W REFLEX MICROSCOPIC - Abnormal; Notable for the following components:   Leukocytes, UA MODERATE (*)    Bacteria, UA RARE (*)    Squamous Epithelial / LPF 6-30 (*)    All other components within normal limits  SEDIMENTATION RATE - Abnormal; Notable for the following components:   Sed Rate 62 (*)    All other components within normal limits  T4, FREE  TSH  VITAMIN B12  FOLATE  RAPID HIV SCREEN  (HIV 1/2 AB+AG)  RPR  VITAMIN D 25 HYDROXY (VIT D DEFICIENCY, FRACTURES)    EKG  EKG Interpretation None       Radiology Ct Head Wo Contrast  Result Date: 12/24/2017 CLINICAL DATA:  Headache this morning, altered mental status, history hypertension EXAM: CT HEAD WITHOUT CONTRAST TECHNIQUE: Contiguous axial images were obtained from the base of the skull through the vertex without intravenous contrast. Sagittal and coronal MPR images reconstructed from axial data set. COMPARISON:  None FINDINGS: Brain: Normal ventricular morphology. No midline shift or mass effect. Normal appearance of brain parenchyma. No intracranial hemorrhage, mass lesion, evidence of acute infarction, or extra-axial fluid collection. Vascular: Unremarkable Skull: Intact Sinuses/Orbits: Clear Other: Opacification of the external auditory canals bilaterally question cerumen. IMPRESSION: No acute intracranial abnormalities. Electronically Signed  By: Lavonia Dana M.D.   On: 12/24/2017 12:38    Procedures Procedures (including critical care time)  Medications Ordered in ED Medications - No data to display   Initial Impression / Assessment and Plan / ED Course  I have reviewed the triage vital signs and the nursing notes.  Pertinent labs & imaging results that were available during my care of the patient were reviewed by me and considered in my medical decision making (see chart for details).    Patient will have these labs drawn we did speak with them and they said that she can return once they are drawn.  Patient has been stable here in the emergency department with no abnormalities noted.  Final Clinical Impressions(s) / ED Diagnoses   Final diagnoses:  None    ED Discharge Orders    None       Dalia Heading, PA-C 12/24/17 1520    Mesner, Corene Cornea, MD 12/24/17 1756

## 2017-12-25 LAB — VITAMIN D 25 HYDROXY (VIT D DEFICIENCY, FRACTURES): Vit D, 25-Hydroxy: 9.4 ng/mL — ABNORMAL LOW (ref 30.0–100.0)

## 2017-12-25 LAB — RPR: RPR Ser Ql: NONREACTIVE

## 2017-12-25 NOTE — ED Notes (Addendum)
Richard, from [facility] called and wanted to verify the  Pt.s labs were sent yesterday with pt.  All labs were resulted yesterday and sent with pt.s Discharge papers.  12/25/2016., 11:16 am.. Delfino Lovett, Rn from [facility] verbalized understanding.

## 2018-04-09 ENCOUNTER — Encounter: Payer: Self-pay | Admitting: Internal Medicine

## 2018-04-10 ENCOUNTER — Other Ambulatory Visit: Payer: Self-pay | Admitting: Internal Medicine

## 2018-04-11 ENCOUNTER — Other Ambulatory Visit: Payer: Self-pay | Admitting: Internal Medicine

## 2018-04-11 DIAGNOSIS — M1990 Unspecified osteoarthritis, unspecified site: Secondary | ICD-10-CM

## 2018-04-11 DIAGNOSIS — I10 Essential (primary) hypertension: Secondary | ICD-10-CM

## 2018-04-11 MED ORDER — HYDROCHLOROTHIAZIDE 25 MG PO TABS: 25.0000 mg | ORAL_TABLET | Freq: Every day | ORAL | 3 refills | Status: DC

## 2018-04-11 MED ORDER — DICLOFENAC SODIUM 1 % TD GEL: 4.0000 g | Freq: Four times a day (QID) | TRANSDERMAL | 2 refills | Status: DC

## 2018-04-11 NOTE — Telephone Encounter (Signed)
Patient is requesting refill on lopressor 25mg , voltaren 1%gel, and hydrodiuril 25mg  tablet, pls send to [facility] on [address].   Per Patient the Ostomy Supplies is running out as well please send orders for that also.

## 2018-04-17 MED ORDER — OSTOMY DRAINABLE POUCH/FLANGE MISC: 1.0000 [IU] | Freq: Every day | 0 refills | Status: DC

## 2018-04-17 MED ORDER — SECURI-T OSTOMY DEODORANT LIQD: 1.0000 [IU] | Freq: Every day | Status: DC

## 2018-04-17 MED ORDER — ADHESIVE REMOVER WIPES MISC: 1.0000 [IU] | Freq: Every day | 0 refills | Status: DC

## 2018-04-17 MED ORDER — SKIN PREP WIPES MISC: 1.0000 [IU] | Freq: Every day | 0 refills | Status: DC

## 2018-04-17 NOTE — Telephone Encounter (Signed)
These meds have been refilled as of 04/11/2018. Will route request for ostomy supplies. Hubbard Hartshorn, RN, BSN

## 2018-04-19 IMAGING — CT CT HEAD W/O CM
4 series · 17 of 47 positions shown, 19 images · non-contrast
Comparison: None

CLINICAL DATA: Headache this morning, altered mental status,
history hypertension

EXAM:
CT HEAD WITHOUT CONTRAST
TECHNIQUE: Contiguous axial images were obtained from the base of the skull
through the vertex without intravenous contrast. Sagittal and
coronal MPR images reconstructed from axial data set.

[Series 3: head without · axial · non-contrast · 0.43mm/px · z∈[-110,+5]mm · 7 of 31 slices shown, 9 images]
[im 4/31  brain]
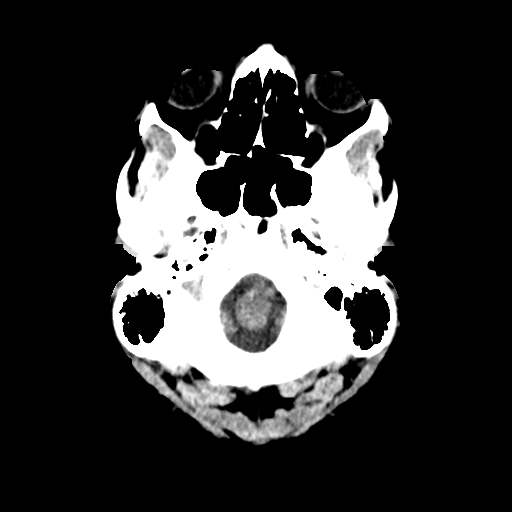
[im 4/31  bone]
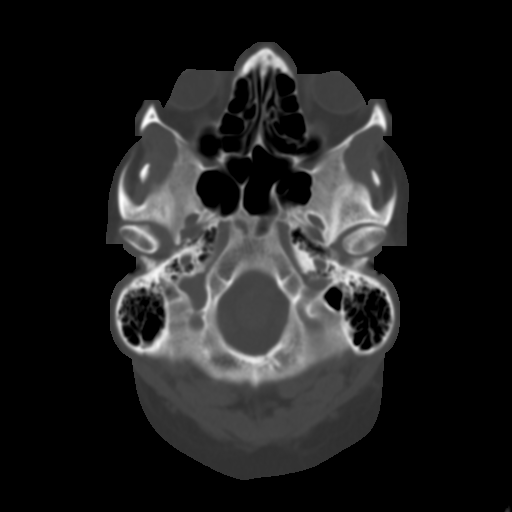
[im 8/31  brain]
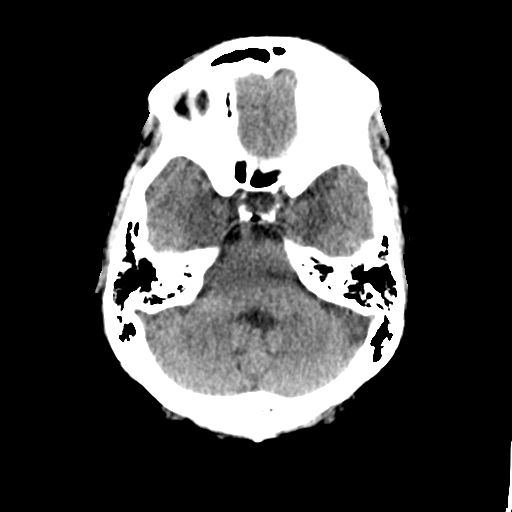
[im 12/31  brain]
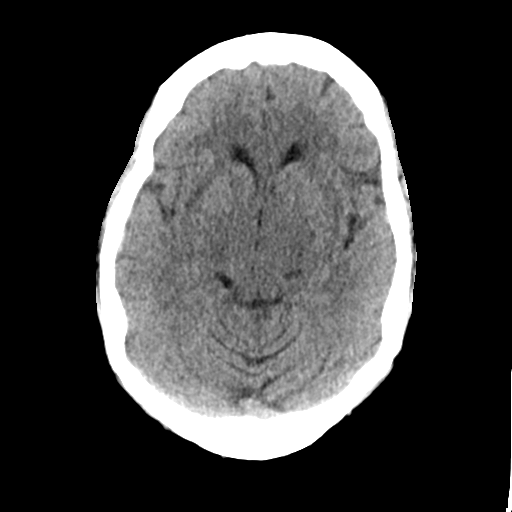
[im 16/31  brain]
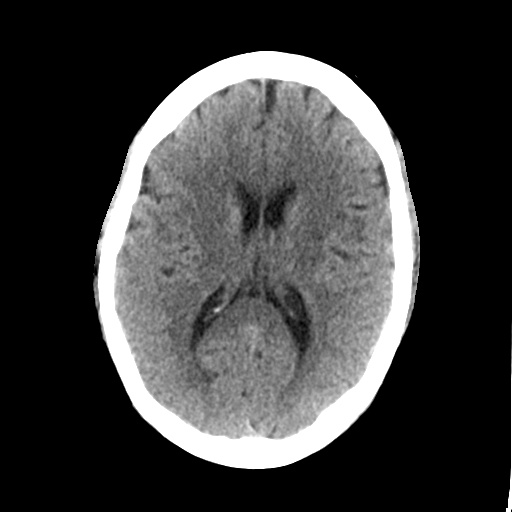
[im 19/31  brain]
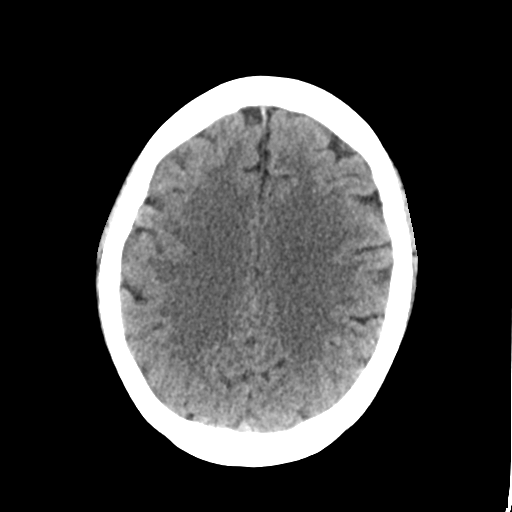
[im 19/31  bone]
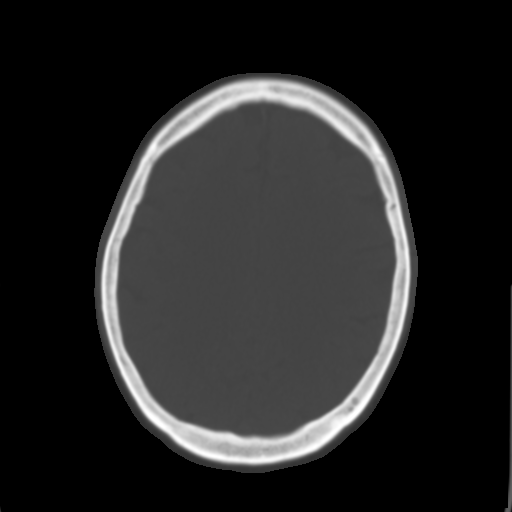
[im 23/31  brain]
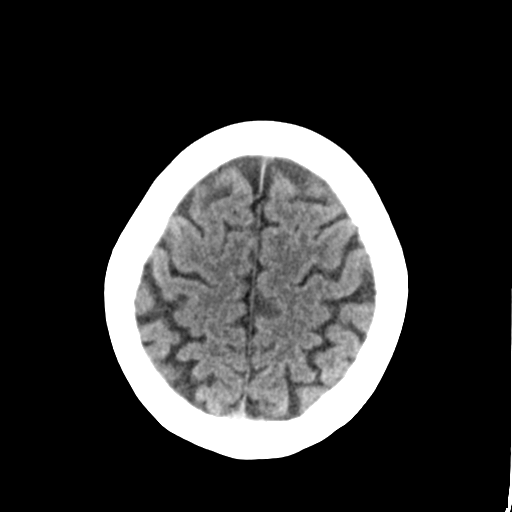
[im 27/31  brain]
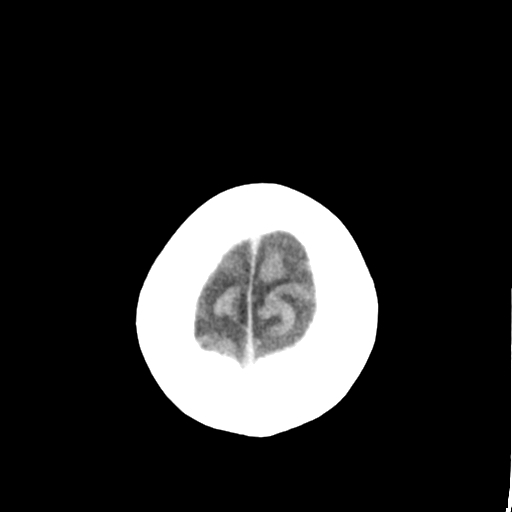

[Series 4: head bone · axial · 0.43mm/px · z∈[-111,-57]mm · 4 of 77 slices shown]
[im 8/77  bone]
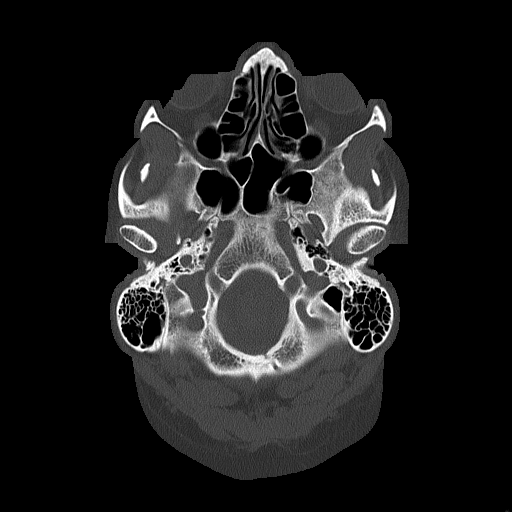
[im 16/77  bone]
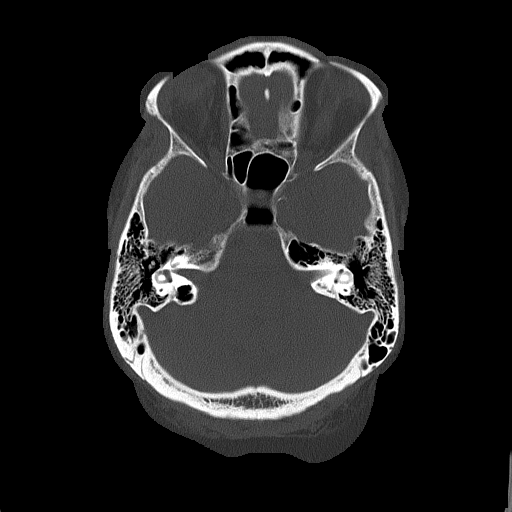
[im 23/77  bone]
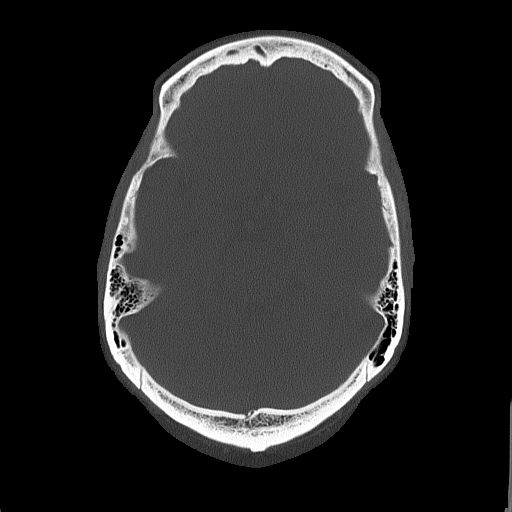
[im 35/77  bone]
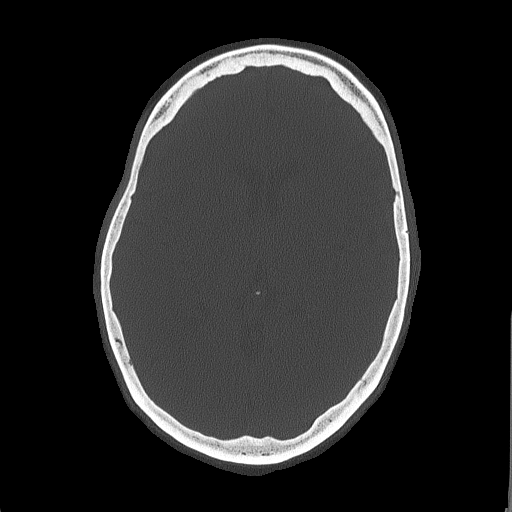

[Series 5: head without cor · coronal · non-contrast · 0.32mm/px · 3 of 63 slices shown]
[im 21/63  brain]
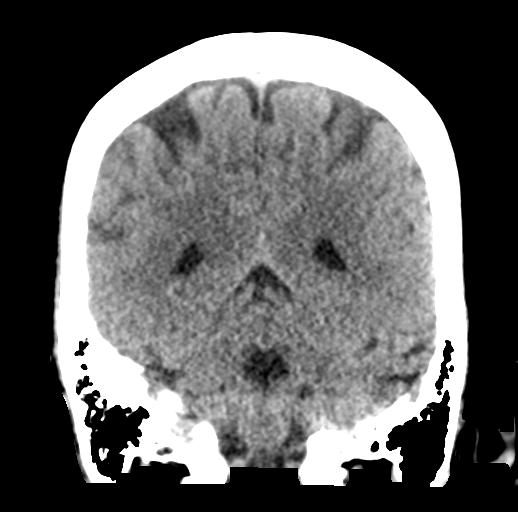
[im 28/63  brain]
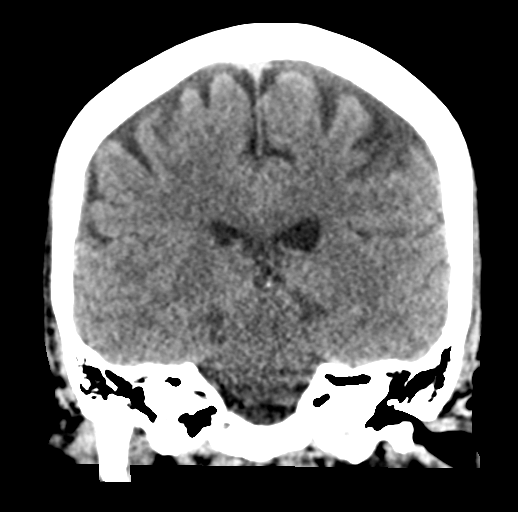
[im 35/63  brain]
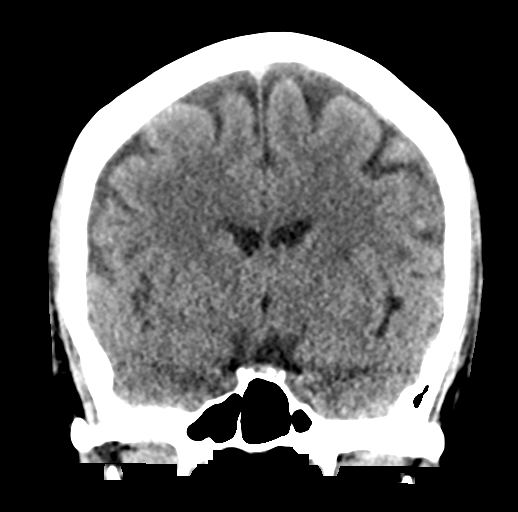

[Series 6: head without sag · sagittal · non-contrast · 0.33mm/px · 3 of 50 slices shown]
[im 17/50  brain]
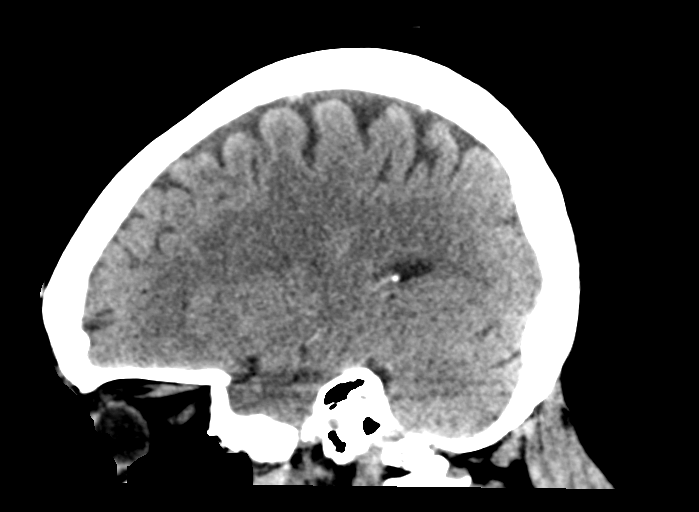
[im 25/50  brain]
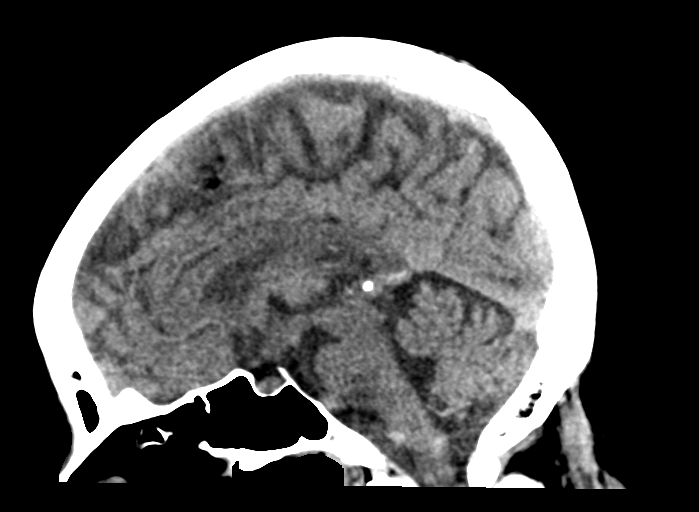
[im 33/50  brain]
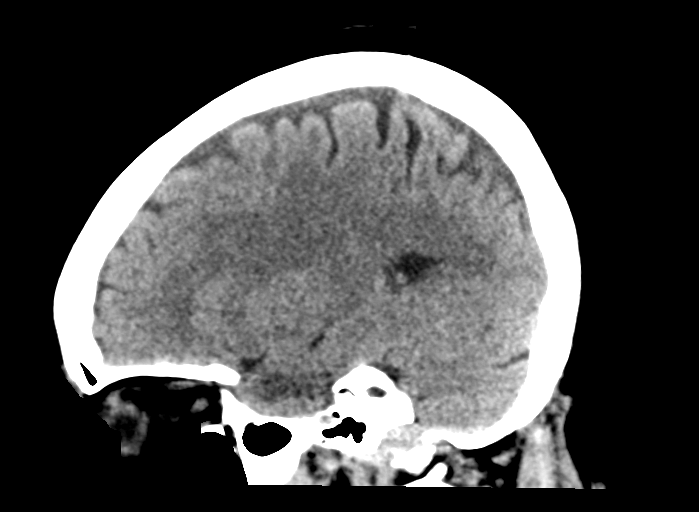

[17 of 47 positions shown; findings below may reference images not displayed]

FINDINGS: Brain: Normal ventricular morphology. No midline shift or mass
effect. Normal appearance of brain parenchyma. No intracranial
hemorrhage, mass lesion, evidence of acute infarction, or
extra-axial fluid collection.

Vascular: Unremarkable

Skull: Intact

Sinuses/Orbits: Clear

Other: Opacification of the external auditory canals bilaterally
question cerumen.
IMPRESSION: No acute intracranial abnormalities.

## 2018-05-13 ENCOUNTER — Other Ambulatory Visit: Payer: Self-pay | Admitting: Internal Medicine

## 2018-05-13 DIAGNOSIS — I10 Essential (primary) hypertension: Secondary | ICD-10-CM

## 2018-05-13 DIAGNOSIS — M1990 Unspecified osteoarthritis, unspecified site: Secondary | ICD-10-CM

## 2018-05-13 NOTE — Telephone Encounter (Signed)
Called pt - stated she is taking Meloxicam for arthritis flare up but not Aleve.

## 2018-05-13 NOTE — Telephone Encounter (Signed)
Pt has not seen Dr Dareen Piano in almost 1 yr. Called pt - stated she had called earlier ; she was told he does not have any appts in the next 2 months. Stated she has to look at her schedule and will call back to be seen by someone else (in [facility]).

## 2018-05-13 NOTE — Telephone Encounter (Signed)
On my last visit with her she said this was not working and she wanted to stop this. She was also taking Alleve with the meloxicam. Can you ask her if she still needs this medication or if she is taking alleve. She can not be on both

## 2018-06-13 ENCOUNTER — Other Ambulatory Visit: Payer: Self-pay | Admitting: Internal Medicine

## 2018-06-13 DIAGNOSIS — I10 Essential (primary) hypertension: Secondary | ICD-10-CM

## 2018-06-13 NOTE — Telephone Encounter (Signed)
Next appt scheduled 8/13 with PCP. 

## 2018-07-03 ENCOUNTER — Other Ambulatory Visit: Payer: Self-pay

## 2018-07-03 ENCOUNTER — Emergency Department (HOSPITAL_COMMUNITY): Payer: Medicaid Other

## 2018-07-03 ENCOUNTER — Encounter (HOSPITAL_COMMUNITY): Payer: Self-pay

## 2018-07-03 ENCOUNTER — Telehealth: Payer: Self-pay | Admitting: *Deleted

## 2018-07-03 ENCOUNTER — Emergency Department (HOSPITAL_COMMUNITY)
Admission: EM | Admit: 2018-07-03 | Discharge: 2018-07-03 | Disposition: A | Discharge status: Home / Self Care | Payer: Medicaid Other | Attending: Emergency Medicine | Admitting: Emergency Medicine

## 2018-07-03 DIAGNOSIS — Z79899 Other long term (current) drug therapy: Secondary | ICD-10-CM | POA: Diagnosis not present

## 2018-07-03 DIAGNOSIS — R109 Unspecified abdominal pain: Secondary | ICD-10-CM | POA: Insufficient documentation

## 2018-07-03 DIAGNOSIS — K625 Hemorrhage of anus and rectum: Secondary | ICD-10-CM | POA: Diagnosis not present

## 2018-07-03 DIAGNOSIS — I10 Essential (primary) hypertension: Secondary | ICD-10-CM | POA: Diagnosis not present

## 2018-07-03 LAB — ABO/RH: ABO/RH(D): B POS

## 2018-07-03 LAB — COMPREHENSIVE METABOLIC PANEL
ALT: 14 U/L (ref 0–44)
AST: 15 U/L (ref 15–41)
Albumin: 3.1 g/dL — ABNORMAL LOW (ref 3.5–5.0)
Alkaline Phosphatase: 90 U/L (ref 38–126)
Anion gap: 9 (ref 5–15)
BILIRUBIN TOTAL: 0.8 mg/dL (ref 0.3–1.2)
BUN: 22 mg/dL — AB (ref 6–20)
CO2: 24 mmol/L (ref 22–32)
CREATININE: 1.12 mg/dL — AB (ref 0.44–1.00)
Calcium: 8.7 mg/dL — ABNORMAL LOW (ref 8.9–10.3)
Chloride: 104 mmol/L (ref 98–111)
GFR calc Af Amer: >60 mL/min (ref >60)
GFR, EST NON AFRICAN AMERICAN: 52 mL/min — AB (ref >60)
Glucose, Bld: 103 mg/dL — ABNORMAL HIGH (ref 70–99)
POTASSIUM: 3.8 mmol/L (ref 3.5–5.1)
Sodium: 137 mmol/L (ref 135–145)
Total Protein: 7.6 g/dL (ref 6.5–8.1)

## 2018-07-03 LAB — CBC
HCT: 34.1 % — ABNORMAL LOW (ref 36.0–46.0)
Hemoglobin: 10.2 g/dL — ABNORMAL LOW (ref 12.0–15.0)
MCH: 24.7 pg — ABNORMAL LOW (ref 26.0–34.0)
MCHC: 29.9 g/dL — ABNORMAL LOW (ref 30.0–36.0)
MCV: 82.6 fL (ref 78.0–100.0)
PLATELETS: 396 10*3/uL (ref 150–400)
RBC: 4.13 MIL/uL (ref 3.87–5.11)
RDW: 15.6 % — AB (ref 11.5–15.5)
WBC: 12.1 10*3/uL — AB (ref 4.0–10.5)

## 2018-07-03 LAB — TYPE AND SCREEN
ABO/RH(D): B POS
ANTIBODY SCREEN: NEGATIVE

## 2018-07-03 LAB — POC OCCULT BLOOD, ED: Fecal Occult Bld: POSITIVE — AB

## 2018-07-03 MED ORDER — AMOXICILLIN 500 MG PO CAPS: 500.0000 mg | ORAL_CAPSULE | Freq: Three times a day (TID) | ORAL | 0 refills | Status: DC

## 2018-07-03 MED ORDER — IOHEXOL 300 MG/ML  SOLN
100.0000 mL | Freq: Once | INTRAMUSCULAR | Status: AC | PRN
  Administered 2018-07-03: 100 mL via INTRAVENOUS

## 2018-07-03 NOTE — ED Notes (Signed)
Discharge instructions discussed with Pt. Pt verbalized understanding. Pt stable and ambulatory.    

## 2018-07-03 NOTE — Telephone Encounter (Signed)
Thank you Helen.. I agree 

## 2018-07-03 NOTE — ED Triage Notes (Signed)
Pt states that she has a colostomy r/t diverticulitis. [facility], and being unable to get her ostomy supplies. Pt states that she has been rinsing her ostomy bag to try to reuse it. C/O skin breakdown around her ostomy. Pt tearful in triage stating that she is concerned about cancer d/t rectal bleeding X1-2 years. Has had colostomy over 2 years.

## 2018-07-03 NOTE — Telephone Encounter (Signed)
Pt rtc, she was advised to call 911, she is very emotional

## 2018-07-03 NOTE — Discharge Instructions (Addendum)
Follow up with [facility] in the next 2 weeks for rectal bleeding

## 2018-07-03 NOTE — ED Provider Notes (Signed)
[facility] Provider Note   CSN: [id number] Arrival date & time: 07/03/18  1625     History   Chief Complaint Chief Complaint  Patient presents with  . Rectal Bleeding    HPI Deanna Aguilar is a 62 y.o. female.  Patient complains of rectal bleeding.  Patient has a colostomy but she states she still having some rectal bleeding out her rectum  The history is provided by the patient. No language interpreter was used.  Rectal Bleeding  Quality:  Bright red Amount:  Scant Timing:  Constant Chronicity:  New Context: not anal fissures   Similar prior episodes: no   Relieved by:  Nothing Worsened by:  Nothing Ineffective treatments:  None tried Associated symptoms: no abdominal pain   Risk factors: no anticoagulant use     Past Medical History:  Diagnosis Date  . Arthritis   . Depression   . Hypertension     Patient Active Problem List   Diagnosis Date Noted  . Rectal bleeding 05/29/2017  . Insomnia 02/21/2016  . Preventative health care 01/11/2016  . PAF (paroxysmal atrial fibrillation) ([place]) 08/18/2015  . Essential hypertension 08/18/2015  . Depression 06/13/2015  . 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 Morbid obesity ([place]) 06/10/2015  . Arthritis- on disability 06/10/2015  . Diverticulitis of colon with perforation s/p colectomy/ostomy 06/09/2015 06/07/2015    Past Surgical History:  Procedure Laterality Date  . COLON RESECTION N/A 06/09/2015   Procedure: SIGMOID COLECTOMY, COLOSTOMY, TAKE DOWN SPLENIC FLECTURE;  Surgeon: Fanny Skates, MD;  Location: [facility];  Service: General;  Laterality: N/A;  . TUBAL LIGATION       OB History   None      Home Medications    Prior to Admission medications   Medication Sig Start Date End Date Taking? Authorizing Provider  diclofenac sodium (VOLTAREN) 1 % GEL Apply 4 g topically 4 (four) times daily. 04/11/18  Yes Aldine Contes, MD  hydrochlorothiazide (HYDRODIURIL) 25 MG  tablet Take 1 tablet (25 mg total) by mouth daily. 04/11/18  Yes Aldine Contes, MD  meloxicam (MOBIC) 7.5 MG tablet TAKE 1 TABLET BY MOUTH ONCE DAILY FOR NO MORE THAN 7 CONSECUTIVE DAYS FOR ARTHRITIS FLARES; WAIT 1 WEEK BETWEEN COURSES 05/13/18  Yes Aldine Contes, MD  metoprolol tartrate (LOPRESSOR) 25 MG tablet TAKE 2 TABLETS BY MOUTH TWICE DAILY 06/13/18  Yes Aldine Contes, MD  Ostomy Supplies (ADHESIVE REMOVER WIPES) MISC 1 Units by Does not apply route daily. 04/17/18   Aldine Contes, MD  Ostomy Supplies (OSTOMY DRAINABLE POUCH/FLANGE) MISC 1 Units by Does not apply route daily. 04/17/18   Aldine Contes, MD  Ostomy Supplies (SECURI-T OSTOMY DEODORANT) LIQD 1 Units by Does not apply route daily. 04/17/18   Aldine Contes, MD  Ostomy Supplies (SKIN PREP WIPES) MISC 1 Units by Does not apply route daily. 04/17/18   Aldine Contes, MD    Family History Family History  Problem Relation Age of Onset  . Heart disease Mother   . Heart attack Mother   . Hypertension Mother   . Other Maternal Grandmother        ovarian issues    Social History Social History   Tobacco Use  . Smoking status: Former Research scientist (life sciences)  . Smokeless tobacco: Never Used  . Tobacco comment: QUIT SMOKING ABOUT 18 YEARS AGO(PER PATIENT)  Substance Use Topics  . Alcohol use: No    Alcohol/week: 0.0 oz  . Drug use: No  Allergies   Patient has no known allergies.   Review of Systems Review of Systems  Constitutional: Negative for appetite change and fatigue.  HENT: Negative for congestion, ear discharge and sinus pressure.        Sore throat  Eyes: Negative for discharge.  Respiratory: Negative for cough.   Cardiovascular: Negative for chest pain.  Gastrointestinal: Positive for hematochezia. Negative for abdominal pain and diarrhea.       Rectal bleeding  Genitourinary: Negative for frequency and hematuria.  Musculoskeletal: Negative for back pain.  Skin: Negative for rash.  Neurological:  Negative for seizures and headaches.  Psychiatric/Behavioral: Negative for hallucinations.     Physical Exam Updated Vital Signs BP (!) 170/75   Pulse 68   Temp 98.2 F (36.8 C) (Oral)   Resp 18   Ht 5\' 9"  (1.753 m)   Wt 127 kg (280 lb)   SpO2 99%   BMI 41.35 kg/m   Physical Exam  Constitutional: She is oriented to person, place, and time. She appears well-developed.  HENT:  Head: Normocephalic.  Pharynx inflamed no exudate  Eyes: Conjunctivae and EOM are normal. No scleral icterus.  Neck: Neck supple. No thyromegaly present.  Cardiovascular: Normal rate and regular rhythm. Exam reveals no gallop and no friction rub.  No murmur heard. Pulmonary/Chest: No stridor. She has no wheezes. She has no rales. She exhibits no tenderness.  Abdominal: She exhibits no distension. There is no tenderness. There is no rebound.  Colostomy  Genitourinary:  Genitourinary Comments: Rectal exam shows heme positive stool  Musculoskeletal: Normal range of motion. She exhibits no edema.  Lymphadenopathy:    She has no cervical adenopathy.  Neurological: She is oriented to person, place, and time. She exhibits normal muscle tone. Coordination normal.  Skin: No rash noted. No erythema.  Psychiatric: She has a normal mood and affect. Her behavior is normal.     ED Treatments / Results  Labs (all labs ordered are listed, but only abnormal results are displayed) Labs Reviewed  COMPREHENSIVE METABOLIC PANEL - Abnormal; Notable for the following components:      Result Value   Glucose, Bld 103 (*)    BUN 22 (*)    Creatinine, Ser 1.12 (*)    Calcium 8.7 (*)    Albumin 3.1 (*)    GFR calc non Af Amer 52 (*)    All other components within normal limits  CBC - Abnormal; Notable for the following components:   WBC 12.1 (*)    Hemoglobin 10.2 (*)    HCT 34.1 (*)    MCH 24.7 (*)    MCHC 29.9 (*)    RDW 15.6 (*)    All other components within normal limits  POC OCCULT BLOOD, ED - Abnormal;  Notable for the following components:   Fecal Occult Bld POSITIVE (*)    All other components within normal limits  TYPE AND SCREEN  ABO/RH    EKG None  Radiology Ct Abdomen Pelvis W Contrast  Result Date: 07/03/2018 CLINICAL DATA:  Abdominal pain and distention. Colostomy. Rectal bleeding. EXAM: CT ABDOMEN AND PELVIS WITH CONTRAST TECHNIQUE: Multidetector CT imaging of the abdomen and pelvis was performed using the standard protocol following bolus administration of intravenous contrast. CONTRAST:  172mL OMNIPAQUE IOHEXOL 300 MG/ML  SOLN COMPARISON:  CT abdomen pelvis 06/07/2015 FINDINGS: LOWER CHEST: No basilar pulmonary nodules or pleural effusion. No apical pericardial effusion. HEPATOBILIARY: Normal hepatic contours and density. No intra- or extrahepatic biliary dilatation. Normal gallbladder. PANCREAS:  Normal parenchymal contours without ductal dilatation. No peripancreatic fluid collection. SPLEEN: Normal. ADRENALS/URINARY TRACT: --Adrenal glands: Normal. --Right kidney/ureter: No hydronephrosis, nephroureterolithiasis, perinephric stranding or solid renal mass. --Left kidney/ureter: No hydronephrosis, nephroureterolithiasis, perinephric stranding or solid renal mass. --Urinary bladder: Normal for degree of distention STOMACH/BOWEL: --Stomach/Duodenum: No hiatal hernia or other gastric abnormality. Normal duodenal course. --Small bowel: No dilatation or inflammation. --Colon: Left lower quadrant colostomy with Hartmann pouch. There is a large peristomal hernia that contains loops of small bowel and colon without evidence of obstruction or inflammation. There are also multiple ventral abdominal hernias containing various segments of bowel. The lowest and largest contains a nondilated segment of small bowel. --Appendix: Normal. VASCULAR/LYMPHATIC: Normal course and caliber of the major abdominal vessels. No abdominal or pelvic lymphadenopathy. REPRODUCTIVE: Normal uterus and ovaries.  MUSCULOSKELETAL. No bony spinal canal stenosis or focal osseous abnormality. OTHER: None. IMPRESSION: 1. Left lower quadrant colostomy with large parastomal hernia containing small bowel and colon without evidence of obstruction or inflammation. 2. Three other ventral abdominal hernias containing various amounts of small bowel or colon. The largest, most inferior hernia is at the level of the iliac crest and contains a long segment of nondilated small bowel. Electronically Signed   By: Ulyses Jarred M.D.   On: 07/03/2018 21:11    Procedures Procedures (including critical care time)  Medications Ordered in ED Medications  iohexol (OMNIPAQUE) 300 MG/ML solution 100 mL (100 mLs Intravenous Contrast Given 07/03/18 2042)     Initial Impression / Assessment and Plan / ED Course  I have reviewed the triage vital signs and the nursing notes.  Pertinent labs & imaging results that were available during my care of the patient were reviewed by me and considered in my medical decision making (see chart for details).   Rectal bleeding with normal CT scan of the abdomen.  She is following up with GI.  Patient also has a pharyngitis and give some amoxicillin  Final Clinical Impressions(s) / ED Diagnoses   Final diagnoses:  None    ED Discharge Orders    None       Milton Ferguson, MD 07/03/18 2146

## 2018-07-03 NOTE — ED Notes (Signed)
Colostomy bag changed and skin barrier cream applied to open sores. MD Zammit notified and he looked at site.

## 2018-07-03 NOTE — ED Notes (Signed)
Hemoccult card @ bedside.

## 2018-07-03 NOTE — Telephone Encounter (Signed)
Pt calls and states she is out of colostomy bags and needs help, she states she is in a homeless shelter and they will not let her stay with her colostomy drainage not being contained, she states she also is afraid the stoma is "getting infected", states she has ordered supplies but they will not be to her until appr 8/9. States she has tried to call her nurse lauriem, but cant get her, she states she has no way of getting to the hospital but she feels laurie will be able to help, have paged laurie at [phone]. Awaiting a call back. A wound care nurse rtc and states laurie is out of office today but she will text her the info. I will ask pt to call 911 and come to ED for eval of stoma.

## 2018-07-04 NOTE — Telephone Encounter (Signed)
Thank you :)

## 2018-07-04 NOTE — Discharge Planning (Signed)
EDCM noted CM consult.  Reviewed chart and basis for consult are unclear as the CM consult order is incomplete and the EDP discharge note does not mention CM instructions.

## 2018-07-05 ENCOUNTER — Telehealth: Payer: Self-pay | Admitting: Internal Medicine

## 2018-07-05 NOTE — Telephone Encounter (Signed)
Pt missed a call, pt haven't listen to vm, pls contact pt# [phone]

## 2018-07-05 NOTE — Telephone Encounter (Signed)
Rtc, got vmail, lm

## 2018-07-10 ENCOUNTER — Encounter (HOSPITAL_COMMUNITY): Payer: Self-pay

## 2018-07-10 ENCOUNTER — Other Ambulatory Visit: Payer: Self-pay

## 2018-07-10 ENCOUNTER — Emergency Department (HOSPITAL_COMMUNITY)
Admission: EM | Admit: 2018-07-10 | Discharge: 2018-07-10 | Disposition: A | Discharge status: Home / Self Care | Payer: Medicaid Other | Attending: Emergency Medicine | Admitting: Emergency Medicine

## 2018-07-10 ENCOUNTER — Encounter: Payer: Self-pay | Admitting: Pediatric Intensive Care

## 2018-07-10 DIAGNOSIS — I10 Essential (primary) hypertension: Secondary | ICD-10-CM | POA: Diagnosis not present

## 2018-07-10 DIAGNOSIS — Z7189 Other specified counseling: Secondary | ICD-10-CM

## 2018-07-10 DIAGNOSIS — Z79899 Other long term (current) drug therapy: Secondary | ICD-10-CM | POA: Diagnosis not present

## 2018-07-10 DIAGNOSIS — Z433 Encounter for attention to colostomy: Secondary | ICD-10-CM | POA: Diagnosis not present

## 2018-07-10 DIAGNOSIS — Z87891 Personal history of nicotine dependence: Secondary | ICD-10-CM | POA: Diagnosis not present

## 2018-07-10 HISTORY — DX: Diverticulitis of intestine, part unspecified, without perforation or abscess without bleeding: K57.92

## 2018-07-10 NOTE — Progress Notes (Signed)
CSW consulted by RN CM for additional homeless resources. CSW met with patient at bedside. Patient is currently staying at [facility] and reports that her exit date is coming up. Patient came to [facility] for ostomy supplies. Patient states she gets $15 in food stamps. CSW provided patient with a list of shelters, food and clothing resources in [place] and surrounding areas. Patient states she will be taking SCAT back to [facility] once she is discharged. No further CSW needs at this time.   Ollen Barges, [place] Work Department  [employer]  [phone]

## 2018-07-10 NOTE — Congregational Nurse Program (Signed)
Congregational Nurse Program Note  Date of Encounter: 07/10/2018  Past Medical History: Past Medical History:  Diagnosis Date  . Arthritis   . Depression   . Diverticulitis   . Hypertension     Encounter Details: CNP Questionnaire - 06/11/18 0830      Questionnaire   Patient Status  Not Applicable    Race  Black or African American    Location Patient Served At  [employer]    Uninsured  Not Applicable    Food  No food insecurities    Housing/Utilities  No permanent housing    Transportation  Yes, need transportation assistance    Interpersonal Safety  No, do not feel physically and emotionally safe where you currently live    Medication  No medication insecurities    Medical Provider  Yes    Referrals  Area Agency    ED Visit Averted  Not Applicable    Life-Saving Intervention Made  Not Applicable     Assistance with SCAT application.

## 2018-07-10 NOTE — ED Triage Notes (Signed)
Patient has an ostomy and states she has been living at a shelter. patient states she has skin breakdown and the bags are not sticking due to the skin breakdown. Patient is out of supplies and need supplies. Patient is very tearful in triage. Patient does not currently have an ostomy bag on.

## 2018-07-10 NOTE — Consult Note (Signed)
[place] Nurse ostomy consult note Stoma type/location: LLQ colostomy Stomal assessment/size: 2 1/4 top to bottom 2 1/2 side to side Peristomal assessment: pink, flush with skin Treatment options for stomal/peristomal skin: barrier ring, skin prep and powder Output none currently Ostomy pouching: 1pc Education provided: Pt living in shelter, out of pouches because she has been rinsing them out to last longer and pulling the barrier away from the skin. She has several ulcerations with pink wound beds. Educated on correct way to empty, not to rinse, how to fit ring. Supplies given to last patient until she gets hers in the mail in 6 days. Patient  Being discharged from ED. Enrolled patient in [place] program: NA Fara Olden, RN-C, [place], [facility] Wound Treatment Associate Ostomy Care Associate

## 2018-07-10 NOTE — Discharge Instructions (Addendum)
See your doctor for ongoing management of your medical condition

## 2018-07-10 NOTE — ED Provider Notes (Signed)
[facility] DEPT Provider Note   CSN: [id number] Arrival date & time: 07/10/18  1149     History   Chief Complaint Chief Complaint  Patient presents with  . ostomy site pain    HPI Deanna Aguilar is a 62 y.o. female.  HPI  Patient here for help with getting ostomy supplies, because she does not have any ostomy bags.  She is currently living in a shelter.  She denies nausea, vomiting, fever, chills abdominal or back pain.  There are no other no modifying factors.   Past Medical History:  Diagnosis Date  . Arthritis   . Depression   . Diverticulitis   . Hypertension     Patient Active Problem List   Diagnosis Date Noted  . Rectal bleeding 05/29/2017  . Insomnia 02/21/2016  . Preventative health care 01/11/2016  . PAF (paroxysmal atrial fibrillation) ([place]) 08/18/2015  . Essential hypertension 08/18/2015  . Depression 06/13/2015  . 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 Morbid obesity ([place]) 06/10/2015  . Arthritis- on disability 06/10/2015  . Diverticulitis of colon with perforation s/p colectomy/ostomy 06/09/2015 06/07/2015    Past Surgical History:  Procedure Laterality Date  . COLON RESECTION N/A 06/09/2015   Procedure: SIGMOID COLECTOMY, COLOSTOMY, TAKE DOWN SPLENIC FLECTURE;  Surgeon: Fanny Skates, MD;  Location: [facility] ORS;  Service: General;  Laterality: N/A;  . TUBAL LIGATION       OB History   None      Home Medications    Prior to Admission medications   Medication Sig Start Date End Date Taking? Authorizing Provider  amoxicillin (AMOXIL) 500 MG capsule Take 1 capsule (500 mg total) by mouth 3 (three) times daily. 07/03/18  Yes Milton Ferguson, MD  Chlorphen-Phenyleph-APAP (CORICIDIN D COLD/FLU/SINUS) 2-5-325 MG TABS Take 1-2 tablets by mouth every 4 (four) hours as needed (cough).   Yes [provider]  diclofenac sodium (VOLTAREN) 1 % GEL Apply 4 g topically 4 (four) times daily. Patient taking differently: Apply  4 g topically 4 (four) times daily as needed (pain).  04/11/18  Yes Aldine Contes, MD  hydrochlorothiazide (HYDRODIURIL) 25 MG tablet Take 1 tablet (25 mg total) by mouth daily. 04/11/18  Yes Aldine Contes, MD  meloxicam (MOBIC) 7.5 MG tablet TAKE 1 TABLET BY MOUTH ONCE DAILY FOR NO MORE THAN 7 CONSECUTIVE DAYS FOR ARTHRITIS FLARES; WAIT 1 WEEK BETWEEN COURSES Patient taking differently: TAKE 1 TABLET BY MOUTH ONCE DAILY PRN FOR PAIN FOR NO MORE THAN 7 CONSECUTIVE DAYS FOR ARTHRITIS FLARES; WAIT 1 WEEK BETWEEN COURSES 05/13/18  Yes Aldine Contes, MD  metoprolol tartrate (LOPRESSOR) 25 MG tablet TAKE 2 TABLETS BY MOUTH TWICE DAILY 06/13/18  Yes Aldine Contes, MD  amoxicillin (AMOXIL) 500 MG capsule Take 1 capsule (500 mg total) by mouth 3 (three) times daily. Patient not taking: Reported on 07/10/2018 07/03/18   Milton Ferguson, MD  Ostomy Supplies (ADHESIVE REMOVER WIPES) MISC 1 Units by Does not apply route daily. 04/17/18   Aldine Contes, MD  Ostomy Supplies (OSTOMY DRAINABLE POUCH/FLANGE) MISC 1 Units by Does not apply route daily. 04/17/18   Aldine Contes, MD  Ostomy Supplies (SECURI-T OSTOMY DEODORANT) LIQD 1 Units by Does not apply route daily. 04/17/18   Aldine Contes, MD  Ostomy Supplies (SKIN PREP WIPES) MISC 1 Units by Does not apply route daily. 04/17/18   Aldine Contes, MD    Family History Family History  Problem Relation Age of Onset  . Heart disease Mother   .  Heart attack Mother   . Hypertension Mother   . Other Maternal Grandmother        ovarian issues    Social History Social History   Tobacco Use  . Smoking status: Former Research scientist (life sciences)  . Smokeless tobacco: Never Used  . Tobacco comment: QUIT SMOKING ABOUT 18 YEARS AGO(PER PATIENT)  Substance Use Topics  . Alcohol use: No    Alcohol/week: 0.0 oz  . Drug use: No     Allergies   Patient has no known allergies.   Review of Systems Review of Systems  All other systems reviewed and are  negative.    Physical Exam Updated Vital Signs BP (!) 119/51   Pulse (!) 57   Temp 98.2 F (36.8 C) (Oral)   Resp 14   Ht 5\' 9"  (1.753 m)   Wt 127 kg (280 lb)   SpO2 100%   BMI 41.35 kg/m   Physical Exam  Constitutional: She is oriented to person, place, and time. She appears well-developed and well-nourished. No distress.  HENT:  Head: Normocephalic and atraumatic.  Eyes: Pupils are equal, round, and reactive to light. Conjunctivae and EOM are normal.  Neck: Normal range of motion and phonation normal. Neck supple.  Cardiovascular: Normal rate.  Pulmonary/Chest: Effort normal.  Abdominal:  Ostomy site left lower quadrant abdominal wall appears normal.  Normal brown stool is present.  Musculoskeletal: Normal range of motion.  Neurological: She is alert and oriented to person, place, and time. She exhibits normal muscle tone.  Skin: Skin is warm and dry.  Psychiatric: She has a normal mood and affect. Her behavior is normal. Judgment and thought content normal.  Nursing note and vitals reviewed.    ED Treatments / Results  Labs (all labs ordered are listed, but only abnormal results are displayed) Labs Reviewed - No data to display  EKG None  Radiology No results found.  Procedures Procedures (including critical care time)  Medications Ordered in ED Medications - No data to display   Initial Impression / Assessment and Plan / ED Course  I have reviewed the triage vital signs and the nursing notes.  Pertinent labs & imaging results that were available during my care of the patient were reviewed by me and considered in my medical decision making (see chart for details).      Patient Vitals for the past 24 hrs:  BP Temp Temp src Pulse Resp SpO2 Height Weight  07/10/18 1500 (!) 119/51 - - (!) 57 14 100 % - -  07/10/18 1430 136/78 - - 64 14 100 % - -  07/10/18 1400 138/78 - - (!) 58 14 100 % - -  07/10/18 1212 - - - - - - 5\' 9"  (1.753 m) 127 kg (280 lb)   07/10/18 1209 (!) 150/85 98.2 F (36.8 C) Oral 70 18 97 % - -    3:22 PM Reevaluation with update and discussion. After initial assessment and treatment, an updated evaluation reveals patient remains comfortable has no further complaints.  She has been seen by case management and social work.  She has no further needs.  Findings discussed and questions answered. Daleen Bo   Medical Decision Making: Need for chronic ostomy supplies  CRITICAL CARE-no Performed by: Daleen Bo   Nursing Notes Reviewed/ Care Coordinated Applicable Imaging Reviewed Interpretation of Laboratory Data incorporated into ED treatment  The patient appears reasonably screened and/or stabilized for discharge and I doubt any other medical condition or other [facility] requiring further  screening, evaluation, or treatment in the ED at this time prior to discharge.  Plan: Home Medications-continue usual medications; Home Treatments-ostomy care; return here if the recommended treatment, does not improve the symptoms; Recommended follow up-PCP, as needed     Final Clinical Impressions(s) / ED Diagnoses   Final diagnoses:  None    ED Discharge Orders    None       Daleen Bo, MD 07/10/18 [id number]
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
## 2018-07-10 NOTE — ED Notes (Signed)
Pt was seen by radiology admin [place] bus stop. Pt reported she had to leave and was unable to wait for her papers. Per radiology admin, pt was pleasant and just stated that she couldn't wait any longer.

## 2018-07-15 ENCOUNTER — Other Ambulatory Visit: Payer: Self-pay | Admitting: Internal Medicine

## 2018-07-15 DIAGNOSIS — I10 Essential (primary) hypertension: Secondary | ICD-10-CM

## 2018-07-16 ENCOUNTER — Ambulatory Visit (INDEPENDENT_AMBULATORY_CARE_PROVIDER_SITE_OTHER): Payer: Medicaid Other | Admitting: Internal Medicine

## 2018-07-16 ENCOUNTER — Other Ambulatory Visit: Payer: Self-pay

## 2018-07-16 ENCOUNTER — Encounter: Payer: Self-pay | Admitting: Internal Medicine

## 2018-07-16 VITALS — BP 148/66 | HR 74 | Temp 98.6°F | Ht 69.0 in | Wt 280.5 lb

## 2018-07-16 DIAGNOSIS — J029 Acute pharyngitis, unspecified: Secondary | ICD-10-CM

## 2018-07-16 DIAGNOSIS — Z8719 Personal history of other diseases of the digestive system: Secondary | ICD-10-CM | POA: Diagnosis not present

## 2018-07-16 DIAGNOSIS — I10 Essential (primary) hypertension: Secondary | ICD-10-CM | POA: Diagnosis not present

## 2018-07-16 DIAGNOSIS — Z79899 Other long term (current) drug therapy: Secondary | ICD-10-CM

## 2018-07-16 DIAGNOSIS — F329 Major depressive disorder, single episode, unspecified: Secondary | ICD-10-CM | POA: Diagnosis not present

## 2018-07-16 DIAGNOSIS — Z6841 Body Mass Index (BMI) 40.0 and over, adult: Secondary | ICD-10-CM

## 2018-07-16 DIAGNOSIS — Z8679 Personal history of other diseases of the circulatory system: Secondary | ICD-10-CM | POA: Diagnosis not present

## 2018-07-16 DIAGNOSIS — Z9049 Acquired absence of other specified parts of digestive tract: Secondary | ICD-10-CM | POA: Diagnosis not present

## 2018-07-16 DIAGNOSIS — I480 Paroxysmal atrial fibrillation: Secondary | ICD-10-CM

## 2018-07-16 DIAGNOSIS — N898 Other specified noninflammatory disorders of vagina: Secondary | ICD-10-CM | POA: Diagnosis not present

## 2018-07-16 DIAGNOSIS — Z933 Colostomy status: Secondary | ICD-10-CM | POA: Diagnosis not present

## 2018-07-16 DIAGNOSIS — N3946 Mixed incontinence: Secondary | ICD-10-CM

## 2018-07-16 DIAGNOSIS — Z0000 Encounter for general adult medical examination without abnormal findings: Secondary | ICD-10-CM

## 2018-07-16 MED ORDER — METOPROLOL TARTRATE 50 MG PO TABS: 50.0000 mg | ORAL_TABLET | Freq: Two times a day (BID) | ORAL | 3 refills | Status: DC

## 2018-07-16 MED ORDER — ESTROGENS, CONJUGATED 0.625 MG/GM VA CREA: 1.0000 | TOPICAL_CREAM | Freq: Every day | VAGINAL | 0 refills | Status: AC

## 2018-07-16 MED ORDER — HYDROCHLOROTHIAZIDE 25 MG PO TABS: 25.0000 mg | ORAL_TABLET | Freq: Every day | ORAL | 3 refills | Status: DC

## 2018-07-16 NOTE — Assessment & Plan Note (Signed)
-  We discussed the importance of having a Pap smear and a mammogram -Patient states that her last Pap smear was painful and she would like not to do this again but also understands the importance of doing a Pap smear -We will readdress this again on her follow-up visit

## 2018-07-16 NOTE — Assessment & Plan Note (Signed)
-  Patient complains of vaginal dryness today -She states that she is not currently sexually active and does not want a Pap smear or pelvic exam currently -I explained to the patient that the vaginal dryness is likely postmenopausal and have prescribed a vaginal estrogen cream for 14 days to see if this improves -We will follow-up on next visit -Of note, patient does complain of some urge/stress incontinence and states that she is going to try Kiegel exercises for this.  If this persists would consider starting her on medication for this.

## 2018-07-16 NOTE — Assessment & Plan Note (Signed)
-  Patient has history of diverticulitis and had a partial colectomy with a colostomy placed -Patient has had a history of some bleeding into the colostomy bag.  She states this is been going on intermittently for a couple of years -We will check a CBC today -I also encouraged the patient follow-up with her surgeon to have the colostomy site checked -Today the area around the site looks clean and there are no open wounds

## 2018-07-16 NOTE — Progress Notes (Signed)
   Subjective:    Patient ID: Deanna Aguilar, female    DOB: March 12, 1956, 62 y.o.   MRN: [id number]  HPI I have seen and examined this patient.  Patient is here for routine follow-up of her hypertension.  Patient states that she has run out of her metoprolol and is also not taking her hydrochlorothiazide today.  Patient also had issues with her ostomy and was seen in the ED for this.  She does state that they refilled her ostomy supplies and that it is doing better.  She still complains of occasional blood in the ostomy bag.  Patient also had a recent sore throat for which she was seen in the ED and was prescribed amoxicillin and states that this is improved.   Review of Systems  Constitutional: Negative.   HENT: Positive for sore throat. Negative for congestion, rhinorrhea, sinus pain and voice change.   Respiratory: Negative.   Cardiovascular: Negative.   Gastrointestinal: Negative.   Genitourinary:       Vaginal dryness  Musculoskeletal: Positive for arthralgias. Negative for back pain and myalgias.  Neurological: Negative.   Psychiatric/Behavioral: Negative.        Objective:   Physical Exam  Constitutional: She is oriented to person, place, and time. She appears well-developed and well-nourished.  HENT:  Head: Normocephalic and atraumatic.  Mouth/Throat: No oropharyngeal exudate.  Neck: Neck supple.  Cardiovascular: Normal rate, regular rhythm and normal heart sounds.  Pulmonary/Chest: Effort normal and breath sounds normal. She has no wheezes. She has no rales.  Abdominal: Soft. Bowel sounds are normal. She exhibits no distension. There is no tenderness.  Musculoskeletal: Normal range of motion. She exhibits no edema.  Lymphadenopathy:    She has no cervical adenopathy.  Neurological: She is alert and oriented to person, place, and time.  Psychiatric: She has a normal mood and affect. Her behavior is normal.          Assessment & Plan:  Please see problem based  charting for assessment and plan:

## 2018-07-16 NOTE — Assessment & Plan Note (Signed)
BP Readings from Last 3 Encounters:  07/16/18 (!) 148/66  07/10/18 (!) 119/51  07/03/18 138/79    Lab Results  Component Value Date   NA 137 07/03/2018   K 3.8 07/03/2018   CREATININE 1.12 (H) 07/03/2018    Assessment: Blood pressure control:  Fair Progress toward BP goal:   Deteriorated Comments: Patient is on metoprolol 50 mill grams twice daily at home as well as hydrochlorothiazide 25 mg daily.  She states that she ran out of her metoprolol and did not take her hydrochlorothiazide today.  Plan: Medications:  continue current medications Educational resources provided:   Self management tools provided:   Other plans: None

## 2018-07-16 NOTE — Assessment & Plan Note (Signed)
-  This problem is chronic and stable -Patient's last episode of A. fib was in the context of acute peritonitis -She has not had recurrent A. fib since then -No need for anticoagulation at current time

## 2018-07-16 NOTE — Assessment & Plan Note (Signed)
-  Patient's weight has remained steady since her last visit -Patient states that she has had to live in a shelter recently and has had depression as well and feels like this may be affecting her attempts to lose weight -Patient did follow-up at [facility] and was prescribed some medication for her depression and states that this helped but she has not followed up with them since then. -Would encourage the patient follow-up at [facility] for her depression and we addressed this problem again on follow-up

## 2018-07-16 NOTE — Patient Instructions (Signed)
-  It was a pleasure seeing you today -I have refilled your metoprolol and hydrochlorothiazide.  If you have any issues obtaining this medication please give our office a call -We will check your blood counts today -Please follow-up at [facility] as well -Please follow-up with me in 3 months

## 2018-07-17 ENCOUNTER — Telehealth: Payer: Self-pay | Admitting: Internal Medicine

## 2018-07-17 ENCOUNTER — Encounter: Payer: Self-pay | Admitting: Internal Medicine

## 2018-07-17 LAB — CBC WITH DIFFERENTIAL/PLATELET
BASOS: 1 %
Basophils Absolute: 0.1 10*3/uL (ref 0.0–0.2)
EOS (ABSOLUTE): 0.5 10*3/uL — ABNORMAL HIGH (ref 0.0–0.4)
EOS: 4 %
HEMATOCRIT: 34.7 % (ref 34.0–46.6)
HEMOGLOBIN: 10.5 g/dL — AB (ref 11.1–15.9)
IMMATURE GRANS (ABS): 0 10*3/uL (ref 0.0–0.1)
IMMATURE GRANULOCYTES: 0 %
LYMPHS: 24 %
Lymphocytes Absolute: 2.6 10*3/uL (ref 0.7–3.1)
MCH: 24 pg — ABNORMAL LOW (ref 26.6–33.0)
MCHC: 30.3 g/dL — ABNORMAL LOW (ref 31.5–35.7)
MCV: 79 fL (ref 79–97)
Monocytes Absolute: 1 10*3/uL — ABNORMAL HIGH (ref 0.1–0.9)
Monocytes: 9 %
NEUTROS ABS: 6.7 10*3/uL (ref 1.4–7.0)
NEUTROS PCT: 62 %
Platelets: 449 10*3/uL (ref 150–450)
RBC: 4.37 x10E6/uL (ref 3.77–5.28)
RDW: 15.8 % — ABNORMAL HIGH (ref 12.3–15.4)
WBC: 10.8 10*3/uL (ref 3.4–10.8)

## 2018-07-17 NOTE — Telephone Encounter (Signed)
I attempted to call the patient today to discuss results of her blood work with her.  Patient noted to have her CBC at baseline.  I left a voicemail for her. No further work-up at this time. We will send patient a letter with her results.

## 2018-07-22 ENCOUNTER — Encounter: Payer: Self-pay | Admitting: Pediatric Intensive Care

## 2018-07-23 ENCOUNTER — Encounter: Payer: Self-pay | Admitting: Pediatric Intensive Care

## 2018-07-30 ENCOUNTER — Encounter: Payer: Self-pay | Admitting: Pediatric Intensive Care

## 2018-08-02 ENCOUNTER — Encounter: Payer: Self-pay | Admitting: Pediatric Intensive Care

## 2018-08-12 NOTE — Congregational Nurse Program (Signed)
Client states that she was seen in ED to get ostomy bags. She states that she is using more than her allotted monthly amount due to high output while she has been in the shelter. She states that she is having breakdown around ostomy site dues to having to change bags frequently. She states that she gets complaints from other guests that she "smells bad". Te client is tearful and expresses anger regarding her current situation. CN will assist with ostomy supplies.

## 2018-08-12 NOTE — Congregational Nurse Program (Signed)
Client is emotional describing her current health situation. She states she will run out of bags. She states she just wants to leave the shelter. She is tearful and angry. CN states she will assist with ostomy supplies as much as possible.

## 2018-08-13 NOTE — Congregational Nurse Program (Signed)
Client states that she's very tired, starts crying and continues to say that she doesn't know how much longer she can continue living at the shelter. CN offered comfort. CN suggests continuing to visit clinic to check in. CN has contacted [facility]. [facility] and she will be able to provide some assistance with ostomy bags.

## 2018-08-13 NOTE — Congregational Nurse Program (Signed)
Client will purchase ostomy bags. CN will contact Maudie Flakes, ostomy nurse regarding resources for extra bags.

## 2018-10-14 ENCOUNTER — Other Ambulatory Visit: Payer: Self-pay | Admitting: Internal Medicine

## 2018-10-14 DIAGNOSIS — M1990 Unspecified osteoarthritis, unspecified site: Secondary | ICD-10-CM

## 2018-10-14 NOTE — Telephone Encounter (Signed)
Dr. Dareen Piano do not have any schedule for Nov. Do you want the patient to be schedule in [facility], please advise.

## 2018-10-17 ENCOUNTER — Emergency Department (HOSPITAL_COMMUNITY)
Admission: EM | Admit: 2018-10-17 | Discharge: 2018-10-18 | Disposition: A | Discharge status: Home / Self Care | Payer: Medicaid Other | Attending: Emergency Medicine | Admitting: Emergency Medicine

## 2018-10-17 ENCOUNTER — Encounter (HOSPITAL_COMMUNITY): Payer: Self-pay | Admitting: *Deleted

## 2018-10-17 ENCOUNTER — Other Ambulatory Visit: Payer: Self-pay

## 2018-10-17 DIAGNOSIS — I10 Essential (primary) hypertension: Secondary | ICD-10-CM | POA: Diagnosis not present

## 2018-10-17 DIAGNOSIS — F209 Schizophrenia, unspecified: Secondary | ICD-10-CM | POA: Diagnosis not present

## 2018-10-17 DIAGNOSIS — Z79899 Other long term (current) drug therapy: Secondary | ICD-10-CM | POA: Diagnosis not present

## 2018-10-17 DIAGNOSIS — Z87891 Personal history of nicotine dependence: Secondary | ICD-10-CM | POA: Insufficient documentation

## 2018-10-17 DIAGNOSIS — Z046 Encounter for general psychiatric examination, requested by authority: Secondary | ICD-10-CM | POA: Diagnosis not present

## 2018-10-17 HISTORY — DX: Colostomy status: Z93.3
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
## 2018-10-17 NOTE — ED Triage Notes (Signed)
Pt brought in by [employer] under IVC.  Pt's CM filed IVC d/t hallucinations.

## 2018-10-17 NOTE — ED Notes (Signed)
Bed: WA29 Expected date:  Expected time:  Means of arrival:  Comments: [employer] 62 yo female IVC cooperative

## 2018-10-18 LAB — COMPREHENSIVE METABOLIC PANEL
ALBUMIN: 3.5 g/dL (ref 3.5–5.0)
ALT: 11 U/L (ref 0–44)
AST: 15 U/L (ref 15–41)
Alkaline Phosphatase: 69 U/L (ref 38–126)
Anion gap: 9 (ref 5–15)
BUN: 27 mg/dL — AB (ref 8–23)
CHLORIDE: 107 mmol/L (ref 98–111)
CO2: 21 mmol/L — ABNORMAL LOW (ref 22–32)
Calcium: 9.2 mg/dL (ref 8.9–10.3)
Creatinine, Ser: 1.17 mg/dL — ABNORMAL HIGH (ref 0.44–1.00)
GFR calc Af Amer: 57 mL/min — ABNORMAL LOW (ref >60)
GFR calc non Af Amer: 49 mL/min — ABNORMAL LOW (ref >60)
GLUCOSE: 119 mg/dL — AB (ref 70–99)
POTASSIUM: 3.6 mmol/L (ref 3.5–5.1)
Sodium: 137 mmol/L (ref 135–145)
Total Bilirubin: 0.5 mg/dL (ref 0.3–1.2)
Total Protein: 8.1 g/dL (ref 6.5–8.1)

## 2018-10-18 LAB — CBC WITH DIFFERENTIAL/PLATELET
Abs Immature Granulocytes: 0.05 10*3/uL (ref 0.00–0.07)
Basophils Absolute: 0.1 10*3/uL (ref 0.0–0.1)
Basophils Relative: 1 %
EOS PCT: 7 %
Eosinophils Absolute: 0.6 10*3/uL — ABNORMAL HIGH (ref 0.0–0.5)
HCT: 33.9 % — ABNORMAL LOW (ref 36.0–46.0)
HEMOGLOBIN: 9.9 g/dL — AB (ref 12.0–15.0)
Immature Granulocytes: 1 %
LYMPHS PCT: 29 %
Lymphs Abs: 2.5 10*3/uL (ref 0.7–4.0)
MCH: 23.6 pg — AB (ref 26.0–34.0)
MCHC: 29.2 g/dL — AB (ref 30.0–36.0)
MCV: 80.7 fL (ref 80.0–100.0)
MONO ABS: 0.9 10*3/uL (ref 0.1–1.0)
Monocytes Relative: 11 %
Neutro Abs: 4.5 10*3/uL (ref 1.7–7.7)
Neutrophils Relative %: 51 %
Platelets: 419 10*3/uL — ABNORMAL HIGH (ref 150–400)
RBC: 4.2 MIL/uL (ref 3.87–5.11)
RDW: 16.9 % — ABNORMAL HIGH (ref 11.5–15.5)
WBC: 8.6 10*3/uL (ref 4.0–10.5)
nRBC: 0 % (ref 0.0–0.2)

## 2018-10-18 LAB — RAPID URINE DRUG SCREEN, HOSP PERFORMED
AMPHETAMINES: NOT DETECTED
BARBITURATES: NOT DETECTED
BENZODIAZEPINES: NOT DETECTED
Cocaine: NOT DETECTED
Opiates: NOT DETECTED
Tetrahydrocannabinol: NOT DETECTED

## 2018-10-18 LAB — SALICYLATE LEVEL

## 2018-10-18 LAB — ETHANOL: Alcohol, Ethyl (B): <10 mg/dL (ref <10)

## 2018-10-18 LAB — ACETAMINOPHEN LEVEL: Acetaminophen (Tylenol), Serum: <10 ug/mL — ABNORMAL LOW (ref 10–30)

## 2018-10-18 MED ORDER — METOPROLOL TARTRATE 25 MG PO TABS
50.0000 mg | ORAL_TABLET | Freq: Two times a day (BID) | ORAL | Status: DC
  Administered 2018-10-18: 50 mg via ORAL
  Filled 2018-10-18: qty 2

## 2018-10-18 MED ORDER — MELOXICAM 7.5 MG PO TABS
7.5000 mg | ORAL_TABLET | Freq: Every day | ORAL | Status: DC
  Administered 2018-10-18: 7.5 mg via ORAL
  Filled 2018-10-18: qty 1

## 2018-10-18 MED ORDER — SECURI-T OSTOMY DEODORANT LIQD: 1.0000 [IU] | Freq: Every day | Status: DC

## 2018-10-18 MED ORDER — DICLOFENAC SODIUM 1 % TD GEL: 4.0000 g | Freq: Four times a day (QID) | TRANSDERMAL | Status: DC | PRN

## 2018-10-18 MED ORDER — OSTOMY DRAINABLE POUCH/FLANGE MISC: 1.0000 [IU] | Freq: Every day | Status: DC

## 2018-10-18 MED ORDER — SKIN PREP WIPES MISC: 1.0000 [IU] | Freq: Every day | Status: DC

## 2018-10-18 MED ORDER — ADHESIVE REMOVER WIPES MISC: 1.0000 [IU] | Freq: Every day | Status: DC

## 2018-10-18 MED ORDER — HYDROCHLOROTHIAZIDE 25 MG PO TABS
25.0000 mg | ORAL_TABLET | Freq: Every day | ORAL | Status: DC
  Administered 2018-10-18: 25 mg via ORAL
  Filled 2018-10-18: qty 1

## 2018-10-18 NOTE — ED Notes (Signed)
Pt requesting ostomy supplies [facility] [phone]), stoma powder & barrier ring.  Material called for supplies.

## 2018-10-18 NOTE — BH Assessment (Signed)
[facility] Assessment Progress Note  Per Buford Dresser, DO, this pt does not require psychiatric hospitalization at this time.  Pt presents under IVC initiated by pt's case manager, which Dr Mariea Clonts has rescinded.  Pt is to be discharged from [facility] with recommendation to follow up with [facility].  This has been included in pt's discharge instructions.  Pt's nurse, Caren Griffins, has been notified.  Jalene Mullet, [place] Triage Specialist [phone]

## 2018-10-18 NOTE — ED Notes (Signed)
Patient walks with a cane, and has a colostomy.  Patient tearful this morning talking about a visitor from [place] who is staying at her apartment that she needed to check on.  Nurse let her call and she seemed very grateful about this and it seemed to calm her down.  No longer tearful.  Interacting with ostomy nurse in a calm and cooperative manner.  Night nurse reported that patient was homeless, so not sure if patient's visitor is a delusion.

## 2018-10-18 NOTE — Progress Notes (Signed)
Per Patriciaann Clan, PA pt is recommended for overnight observation and to be reassessed in the AM by psych due to possible hallucinations and paranoid schizophrenia. EDP Ward, Ozella Almond, PA-C and TCU nurse have been advised.   Lind Covert, MSW, LCSW Therapeutic Triage Specialist  [phone]

## 2018-10-18 NOTE — ED Notes (Signed)
TTS assessment in progress via tele psych.

## 2018-10-18 NOTE — ED Notes (Signed)
Pt stated "Did I just overhear you talking to my Case worker they were going to let me go in the morning?"  Informed pt the phone has not rang and this writer has not spoken with her case worker.  Pt verbalized understanding.

## 2018-10-18 NOTE — Discharge Instructions (Signed)
For your mental health needs, you are advised to follow up with [facility].  New and returning patients are seen at their walk-in clinic.  Walk-in hours are Monday - Friday from 8:00 am - 3:00 pm.  Walk-in patients are seen on a first come, first served basis.  Try to arrive as early as possible for he best chance of being seen the same day: ° °     [facility] °     [address] °     [place], [place] [address] °     [phone] °

## 2018-10-18 NOTE — BHH Suicide Risk Assessment (Cosign Needed)
Suicide Risk Assessment  Discharge Assessment   [facility] Discharge Suicide Risk Assessment   Principal Problem: <principal problem not specified> Discharge Diagnoses:  Patient Active Problem List   Diagnosis Date Noted  . Vaginal dryness [N89.8] 07/16/2018  . Rectal bleeding [K62.5] 05/29/2017  . Insomnia [G47.00] 02/21/2016  . Preventative health care [Z00.00] 01/11/2016  . PAF (paroxysmal atrial fibrillation) ([place]) [I48.0] 08/18/2015  . Essential hypertension [I10] 08/18/2015  . Depression [F32.9] 06/13/2015  . 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 Morbid obesity ([place]) [E66.01] 06/10/2015  . Arthritis- on disability [M19.90] 06/10/2015  . Diverticulitis of colon with perforation s/p colectomy/ostomy 06/09/2015 [K57.20] 06/07/2015    Total Time spent with patient: 30 minutes  Musculoskeletal: Strength & Muscle Tone: within normal limits Gait & Station: normal Patient leans: N/A  Psychiatric Specialty Exam:   Blood pressure 131/79, pulse 62, temperature 98.4 F (36.9 C), temperature source Oral, resp. rate 18, height 5\' 9"  (1.753 m), weight 113.4 kg, SpO2 98 %.Body mass index is 36.92 kg/m.  General Appearance: Casual  Eye Contact::  Good  Speech:  Clear and Coherent409  Volume:  Normal  Mood:  Anxious  Affect:  Congruent  Thought Process:  Coherent and Linear  Orientation:  Full (Time, Place, and Person)  Thought Content:  Logical  Suicidal Thoughts:  No  Homicidal Thoughts:  No  Memory:  Immediate;   Good Recent;   Fair Remote;   Fair  Judgement:  Fair  Insight:  Fair  Psychomotor Activity:  Normal  Concentration:  Good  Recall:  Bangs of Knowledge:Good  Language: Good  Akathisia:  No  Handed:  Right  AIMS (if indicated):     Assets:  Agricultural consultant Housing Social Support  Sleep:     Cognition: WNL  ADL's:  Intact   Mental Status Per Nursing Assessment::   On Admission:   Pt was brought in to the [facility]  under IVC for bizarre behavior and believing people were breaking into her apartment and taking her things. Pt denies any suicidal/homicidal and hallucinations. Pt has a friend living with her and feels safe at home. She is currently looking for a new apartment. Pt is psychiatrically clear for discharge.   Demographic Factors:  Low socioeconomic status, Living alone and Unemployed  Loss Factors: Decline in physical health  Historical Factors: NA  Risk Reduction Factors:   Sense of responsibility to family  Continued Clinical Symptoms:  Schizophrenia:   Paranoid or undifferentiated type  Cognitive Features That Contribute To Risk:  Closed-mindedness    Suicide Risk:  Minimal: No identifiable suicidal ideation.  Patients presenting with no risk factors but with morbid ruminations; may be classified as minimal risk based on the severity of the depressive symptoms    Plan Of Care/Follow-up recommendations:  Activity:  as tolerated Diet:  Heart healthy  Ethelene Hal, NP 10/18/2018, 12:05 PM
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
## 2018-10-18 NOTE — ED Notes (Signed)
Ostomy nurse in to see patient and assist with changing her colostomy bag.
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
## 2018-10-18 NOTE — Consult Note (Addendum)
[place] Nurse ostomy consult note Pt is familiar to [facility] team from previous visits.  She is homeless at times, according to the EMR, and has difficulty obtaining supplies.  Provided her with [facility] plan phone number and informed her she must contact them to set it up if she is eligible and she verbalized understanding.  She has been independent with pouch changes and emptying and ordering supplies since surgery was performed in 7/16.  She has developed painful wounds surrounding the stoma which has created a difficult pouching situation and she is going through a new pouch Q 1-2 days.  Stoma type/location: Colostomy stoma is red and viable, flush with skin level, 2 inches and oval Peristomal assessment:  There are 4 full thickness wounds of unknown etiology (Pt states she is a keloid)  surrounding the stoma from 6:00 o'clock to 9:00 o'clock; involved area is 7X7cm.  Each wound is red and has mod amt tan drainage, no dor, painful to touch, and depth is approx .2cm.  It is difficult to maintain a pouch seal since the wounds are constantly creating weeping mod amt tan drainage behind the pouch barrier.  Treatment options for stomal/peristomal skin: Aquacel cut and placed over the wound beds to attempt to absorb drainage.  Output: Mod amt semiformed brown stool  Ostomy pouching: 1pc convex with barrier ring to attempt to maintain a seal.  Education provided: Demonstrated pouch change and wound care to patient.  Provided with 7 sets of free samples for discharge and she verbalized understasnding.   Plan: Assist patient with changing ostomy pouch PRN if leaking as follows: Remove old pouch and cut the next one using pattern at bedside.  Apply barrier ring, stretching to the same size as the opening in the pouch.  Apply skin prep to skin and wounds surrounding the ostomy, then cut small pieces of Aquacel and apply over the wounds, then apply the pouch.  Add an ostomy belt if patient desires.  Use  products: Use Supplies: Colin Broach # [id number], barrier ring, Kellie Simmering # [id number], convex Roselee Culver # [phone], ostomy belt Kellie Simmering # [phone] One hour spent performing this consult. Please re-consult if further assistance is needed.  Thank-you,  Julien Girt MSN, [place], [place], [place], [place]

## 2018-10-18 NOTE — ED Notes (Signed)
Pt crying stating "They're really going to keep me here all night?"  Informed pt the plan is to be evaluated again in the morning.  Pt then requesting to shut door.  Informed pt unable to shut the door at this time.  Pt stated "I understanding."

## 2018-11-11 ENCOUNTER — Emergency Department (HOSPITAL_COMMUNITY)
Admission: EM | Admit: 2018-11-11 | Discharge: 2018-11-12 | Disposition: A | Discharge status: Other Acute Inpatient Hospital | Payer: Medicaid Other | Attending: Emergency Medicine | Admitting: Emergency Medicine

## 2018-11-11 ENCOUNTER — Encounter (HOSPITAL_COMMUNITY): Payer: Self-pay | Admitting: Student

## 2018-11-11 DIAGNOSIS — Z79899 Other long term (current) drug therapy: Secondary | ICD-10-CM | POA: Insufficient documentation

## 2018-11-11 DIAGNOSIS — Z933 Colostomy status: Secondary | ICD-10-CM | POA: Insufficient documentation

## 2018-11-11 DIAGNOSIS — Z87891 Personal history of nicotine dependence: Secondary | ICD-10-CM | POA: Diagnosis not present

## 2018-11-11 DIAGNOSIS — Z008 Encounter for other general examination: Secondary | ICD-10-CM | POA: Diagnosis present

## 2018-11-11 DIAGNOSIS — Z9119 Patient's noncompliance with other medical treatment and regimen: Secondary | ICD-10-CM | POA: Insufficient documentation

## 2018-11-11 DIAGNOSIS — F209 Schizophrenia, unspecified: Secondary | ICD-10-CM | POA: Insufficient documentation

## 2018-11-11 DIAGNOSIS — I10 Essential (primary) hypertension: Secondary | ICD-10-CM | POA: Insufficient documentation

## 2018-11-11 DIAGNOSIS — F23 Brief psychotic disorder: Secondary | ICD-10-CM

## 2018-11-11 LAB — CBC
HCT: 36.9 % (ref 36.0–46.0)
Hemoglobin: 10.6 g/dL — ABNORMAL LOW (ref 12.0–15.0)
MCH: 23 pg — ABNORMAL LOW (ref 26.0–34.0)
MCHC: 28.7 g/dL — ABNORMAL LOW (ref 30.0–36.0)
MCV: 80.2 fL (ref 80.0–100.0)
NRBC: 0 % (ref 0.0–0.2)
Platelets: 518 10*3/uL — ABNORMAL HIGH (ref 150–400)
RBC: 4.6 MIL/uL (ref 3.87–5.11)
RDW: 17.1 % — ABNORMAL HIGH (ref 11.5–15.5)
WBC: 9.6 10*3/uL (ref 4.0–10.5)

## 2018-11-11 LAB — COMPREHENSIVE METABOLIC PANEL
ALT: 11 U/L (ref 0–44)
AST: 14 U/L — ABNORMAL LOW (ref 15–41)
Albumin: 3.9 g/dL (ref 3.5–5.0)
Alkaline Phosphatase: 74 U/L (ref 38–126)
Anion gap: 9 (ref 5–15)
BUN: 24 mg/dL — ABNORMAL HIGH (ref 8–23)
CO2: 24 mmol/L (ref 22–32)
Calcium: 9.1 mg/dL (ref 8.9–10.3)
Chloride: 104 mmol/L (ref 98–111)
Creatinine, Ser: 1.3 mg/dL — ABNORMAL HIGH (ref 0.44–1.00)
GFR calc Af Amer: 51 mL/min — ABNORMAL LOW (ref >60)
GFR calc non Af Amer: 44 mL/min — ABNORMAL LOW (ref >60)
Glucose, Bld: 106 mg/dL — ABNORMAL HIGH (ref 70–99)
Potassium: 3.8 mmol/L (ref 3.5–5.1)
Sodium: 137 mmol/L (ref 135–145)
Total Bilirubin: 0.5 mg/dL (ref 0.3–1.2)
Total Protein: 8.8 g/dL — ABNORMAL HIGH (ref 6.5–8.1)

## 2018-11-11 LAB — RAPID URINE DRUG SCREEN, HOSP PERFORMED
AMPHETAMINES: NOT DETECTED
Barbiturates: NOT DETECTED
Benzodiazepines: NOT DETECTED
Cocaine: NOT DETECTED
Opiates: NOT DETECTED
Tetrahydrocannabinol: NOT DETECTED

## 2018-11-11 LAB — SALICYLATE LEVEL: Salicylate Lvl: <7 mg/dL (ref 2.8–30.0)

## 2018-11-11 LAB — URINALYSIS, ROUTINE W REFLEX MICROSCOPIC
Bilirubin Urine: NEGATIVE
Glucose, UA: NEGATIVE mg/dL
Hgb urine dipstick: NEGATIVE
Ketones, ur: NEGATIVE mg/dL
Nitrite: NEGATIVE
PROTEIN: NEGATIVE mg/dL
Specific Gravity, Urine: 1.021 (ref 1.005–1.030)
pH: 5 (ref 5.0–8.0)

## 2018-11-11 LAB — I-STAT BETA HCG BLOOD, ED (MC, WL, AP ONLY)

## 2018-11-11 LAB — ACETAMINOPHEN LEVEL: Acetaminophen (Tylenol), Serum: <10 ug/mL — ABNORMAL LOW (ref 10–30)

## 2018-11-11 LAB — ETHANOL: Alcohol, Ethyl (B): <10 mg/dL (ref <10)

## 2018-11-11 MED ORDER — ZOLPIDEM TARTRATE 5 MG PO TABS
5.0000 mg | ORAL_TABLET | Freq: Every evening | ORAL | Status: DC | PRN
  Administered 2018-11-12: 5 mg via ORAL
  Filled 2018-11-11: qty 1

## 2018-11-11 MED ORDER — METOPROLOL TARTRATE 25 MG PO TABS
50.0000 mg | ORAL_TABLET | Freq: Two times a day (BID) | ORAL | Status: DC
  Administered 2018-11-11 – 2018-11-12 (×2): 50 mg via ORAL
  Filled 2018-11-11 (×2): qty 2

## 2018-11-11 MED ORDER — CEPHALEXIN 500 MG PO CAPS
500.0000 mg | ORAL_CAPSULE | Freq: Two times a day (BID) | ORAL | Status: DC
  Administered 2018-11-11 – 2018-11-12 (×2): 500 mg via ORAL
  Filled 2018-11-11 (×2): qty 1

## 2018-11-11 MED ORDER — ACETAMINOPHEN 325 MG PO TABS
650.0000 mg | ORAL_TABLET | ORAL | Status: DC | PRN
  Administered 2018-11-12 (×2): 650 mg via ORAL
  Filled 2018-11-11 (×2): qty 2

## 2018-11-11 MED ORDER — ONDANSETRON HCL 4 MG PO TABS
4.0000 mg | ORAL_TABLET | Freq: Three times a day (TID) | ORAL | Status: DC | PRN
  Administered 2018-11-12: 4 mg via ORAL
  Filled 2018-11-11: qty 1

## 2018-11-11 MED ORDER — ADHESIVE REMOVER WIPES MISC: 1.0000 [IU] | Freq: Every day | Status: DC

## 2018-11-11 MED ORDER — OSTOMY DRAINABLE POUCH/FLANGE MISC: 1.0000 [IU] | Freq: Every day | Status: DC

## 2018-11-11 MED ORDER — ALUM & MAG HYDROXIDE-SIMETH 200-200-20 MG/5ML PO SUSP: 30.0000 mL | Freq: Four times a day (QID) | ORAL | Status: DC | PRN

## 2018-11-11 MED ORDER — HYDROCHLOROTHIAZIDE 25 MG PO TABS
25.0000 mg | ORAL_TABLET | Freq: Every day | ORAL | Status: DC
  Administered 2018-11-12: 25 mg via ORAL
  Filled 2018-11-11 (×2): qty 1

## 2018-11-11 MED ORDER — SECURI-T OSTOMY DEODORANT LIQD: 1.0000 [IU] | Freq: Every day | Status: DC

## 2018-11-11 MED ORDER — SKIN PREP WIPES MISC: 1.0000 [IU] | Freq: Every day | Status: DC

## 2018-11-11 NOTE — ED Notes (Signed)
TTS in with pt and caseworker

## 2018-11-11 NOTE — ED Notes (Signed)
Talking quielty w/ case worker

## 2018-11-11 NOTE — ED Notes (Signed)
Security into wand pt, lunch given

## 2018-11-11 NOTE — Progress Notes (Signed)
Pt meets inpatient criteria per Diamantina Monks, NP. Referral information has been sent to the following hospitals for review:  [facility]   Disposition will continue to assist with inpatient placement needs.   Audree Camel, LCSW, [place] Disposition [place] [facility]/TTS [phone]

## 2018-11-11 NOTE — BH Assessment (Addendum)
[facility] Assessment Progress Note Case was staffed with Lord DNP who recommended a inpatient admission to assist with stabilization.       

## 2018-11-11 NOTE — ED Notes (Signed)
Pt ambulatory w/o difficulty, uses a cane.  Pt reports that she Is "angry" because she has been brought back in again, and IVC'd by her case worker, who is present.  PT reports that she wants her case worker present when she sees the MD.    Pt denies si/hi/avh at this time, and reports that she lives alone and functions independently.

## 2018-11-11 NOTE — ED Notes (Signed)
Deanna Aguilar [phone] Caseworker information

## 2018-11-11 NOTE — ED Notes (Signed)
.  .  .        xx

## 2018-11-11 NOTE — ED Notes (Signed)
Eating sandwich, nad, angry about having to stay

## 2018-11-11 NOTE — ED Notes (Signed)
Pharmacy in w/ pt
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
## 2018-11-11 NOTE — ED Notes (Signed)
PA into talk w/ pt and case worker

## 2018-11-11 NOTE — ED Notes (Signed)
Pocket book, pt belongings bag locked in locker 32
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
## 2018-11-11 NOTE — ED Notes (Signed)
Bra removed and placed in pts belongings.

## 2018-11-12 ENCOUNTER — Emergency Department (HOSPITAL_COMMUNITY): Payer: Medicaid Other

## 2018-11-12 NOTE — ED Notes (Addendum)
Patient has asked to speak with wound care RN regarding colostomy. I educated patient regarding wound care RN is not here at night and will notify day shift RN of her request.

## 2018-11-12 NOTE — ED Notes (Signed)
I have just phoned report to [facility]; thence phoned [facility] transport (message left).

## 2018-11-12 NOTE — Consult Note (Signed)
[facility] Nurse ostomy consult note Stoma type/location: RLQ flush colostomy with peristomal breakdown.  She has been treating with stoma powder.  She has silver hydrofiber in her supplies but has not been using. We will begin today.  Stomal assessment/size: 2 " flush and oval in shape.   Peristomal assessment: nonintact lesions  9 o'clock:  1 cm x 0.5 cm x 0.1 cm 6 o'clock 0.5 cm x 2 cm (appears 2 lesions made one large wound.) Treatment options for stomal/peristomal skin: Aquacel Ag to nonintact wounds.  Convex pouch and barrier ring.  Output soft brown stool Ostomy pouching: 1pc.convex  Education provided: She is independent with care, just needed supplies.  She states [employer] should be delivering her supplies today and she wont be there.  I have ordered 4 pouch sets and Aquacel Ag to get her through until she can sort that out.  Enrolled patient in [place] program: Yes previously Will not follow at this time.  Please re-consult if needed.  Domenic Moras MSN, RN, FNP-BC CWON Wound, Ostomy, Continence Nurse Pager [phone]

## 2018-11-12 NOTE — ED Notes (Addendum)
Contacted sheriff to get estimated time of arrival.  He said it may be around 6pm to 7pm.

## 2018-11-12 NOTE — BH Assessment (Signed)
[facility] Assessment Progress Note  Per Waylan Boga, DNP, this pt requires psychiatric hospitalization at this time.  Pt presents under IVC initiated by pt's Education officer, museum, which EDP Duffy Bruce, MD has upheld.  At 09:41 Lenna Sciara calls from [facility] to report that pt has been accepted to their facility by Dr Ananias Pilgrim.  Buford Dresser, DO concurs with this decision.  Pt's nurse, Octavia Bruckner, has been notified, and agrees to call report to [phone].  Pt is to be transported via [facility].  [place] Coordinator [phone]

## 2018-11-12 NOTE — Progress Notes (Signed)
CSW received voicemail from [facility] requesting [facility] fax over patient's IVC, chest x-ray and EKG. CSW aware patient has IVC paperwork but has not had a chest x-ray or EKG yet. CSW to notify EDP so that we can get orders placed. CSW will continue to follow.   Ollen Barges, [place] Work Department  [employer]  [phone]

## 2018-11-14 LAB — URINE CULTURE

## 2018-11-15 ENCOUNTER — Telehealth: Payer: Self-pay

## 2018-11-15 NOTE — Telephone Encounter (Signed)
Post ED Visit - Positive Culture Follow-up  Culture report reviewed by antimicrobial stewardship pharmacist:  []  Elenor Quinones, Pharm.D. []  Heide Guile, Pharm.D., BCPS AQ-ID []  Parks Neptune, Pharm.D., BCPS []  Alycia Rossetti, Pharm.D., BCPS []  [place], Pharm.D., BCPS, AAHIVP []  Legrand Como, Pharm.D., BCPS, AAHIVP [x]  Salome Arnt, PharmD, BCPS []  Johnnette Gourd, PharmD, BCPS []  Hughes Better, PharmD, BCPS []  Leeroy Cha, PharmD  Positive urine culture Treated with Cephalexin, organism sensitive to the same and no further patient follow-up is required at this time.  Genia Del 11/15/2018, 9:06 AM

## 2018-11-30 ENCOUNTER — Emergency Department (HOSPITAL_COMMUNITY): Payer: Medicaid Other

## 2018-11-30 ENCOUNTER — Other Ambulatory Visit: Payer: Self-pay

## 2018-11-30 ENCOUNTER — Encounter (HOSPITAL_COMMUNITY): Payer: Self-pay

## 2018-11-30 ENCOUNTER — Emergency Department (HOSPITAL_COMMUNITY)
Admission: EM | Admit: 2018-11-30 | Discharge: 2018-11-30 | Disposition: A | Discharge status: Home / Self Care | Payer: Medicaid Other | Attending: Emergency Medicine | Admitting: Emergency Medicine

## 2018-11-30 DIAGNOSIS — T433X5A Adverse effect of phenothiazine antipsychotics and neuroleptics, initial encounter: Secondary | ICD-10-CM | POA: Insufficient documentation

## 2018-11-30 DIAGNOSIS — I10 Essential (primary) hypertension: Secondary | ICD-10-CM | POA: Diagnosis not present

## 2018-11-30 DIAGNOSIS — R2243 Localized swelling, mass and lump, lower limb, bilateral: Secondary | ICD-10-CM | POA: Diagnosis present

## 2018-11-30 DIAGNOSIS — T50905A Adverse effect of unspecified drugs, medicaments and biological substances, initial encounter: Secondary | ICD-10-CM

## 2018-11-30 DIAGNOSIS — Y69 Unspecified misadventure during surgical and medical care: Secondary | ICD-10-CM | POA: Diagnosis not present

## 2018-11-30 DIAGNOSIS — Z87891 Personal history of nicotine dependence: Secondary | ICD-10-CM | POA: Insufficient documentation

## 2018-11-30 DIAGNOSIS — Z79899 Other long term (current) drug therapy: Secondary | ICD-10-CM | POA: Insufficient documentation

## 2018-11-30 DIAGNOSIS — T887XXA Unspecified adverse effect of drug or medicament, initial encounter: Secondary | ICD-10-CM | POA: Diagnosis not present

## 2018-11-30 LAB — CBC WITH DIFFERENTIAL/PLATELET
Abs Immature Granulocytes: 0.04 10*3/uL (ref 0.00–0.07)
Basophils Absolute: 0 10*3/uL (ref 0.0–0.1)
Basophils Relative: 0 %
Eosinophils Absolute: 0.1 10*3/uL (ref 0.0–0.5)
Eosinophils Relative: 1 %
HCT: 37.1 % (ref 36.0–46.0)
Hemoglobin: 11 g/dL — ABNORMAL LOW (ref 12.0–15.0)
Immature Granulocytes: 0 %
Lymphocytes Relative: 21 %
Lymphs Abs: 2 10*3/uL (ref 0.7–4.0)
MCH: 24.3 pg — ABNORMAL LOW (ref 26.0–34.0)
MCHC: 29.6 g/dL — ABNORMAL LOW (ref 30.0–36.0)
MCV: 82.1 fL (ref 80.0–100.0)
MONO ABS: 1 10*3/uL (ref 0.1–1.0)
Monocytes Relative: 11 %
Neutro Abs: 6.1 10*3/uL (ref 1.7–7.7)
Neutrophils Relative %: 67 %
Platelets: 509 10*3/uL — ABNORMAL HIGH (ref 150–400)
RBC: 4.52 MIL/uL (ref 3.87–5.11)
RDW: 17.1 % — ABNORMAL HIGH (ref 11.5–15.5)
WBC: 9.3 10*3/uL (ref 4.0–10.5)
nRBC: 0 % (ref 0.0–0.2)

## 2018-11-30 LAB — BASIC METABOLIC PANEL
Anion gap: 10 (ref 5–15)
BUN: 31 mg/dL — AB (ref 8–23)
CO2: 24 mmol/L (ref 22–32)
Calcium: 9.3 mg/dL (ref 8.9–10.3)
Chloride: 104 mmol/L (ref 98–111)
Creatinine, Ser: 1.31 mg/dL — ABNORMAL HIGH (ref 0.44–1.00)
GFR calc Af Amer: 51 mL/min — ABNORMAL LOW (ref >60)
GFR calc non Af Amer: 44 mL/min — ABNORMAL LOW (ref >60)
Glucose, Bld: 115 mg/dL — ABNORMAL HIGH (ref 70–99)
POTASSIUM: 3.3 mmol/L — AB (ref 3.5–5.1)
Sodium: 138 mmol/L (ref 135–145)

## 2018-11-30 LAB — URINALYSIS, ROUTINE W REFLEX MICROSCOPIC
Bilirubin Urine: NEGATIVE
Glucose, UA: NEGATIVE mg/dL
Hgb urine dipstick: NEGATIVE
Ketones, ur: NEGATIVE mg/dL
Nitrite: NEGATIVE
PROTEIN: NEGATIVE mg/dL
Specific Gravity, Urine: 1.023 (ref 1.005–1.030)
pH: 5 (ref 5.0–8.0)

## 2018-11-30 MED ORDER — DIPHENHYDRAMINE HCL 25 MG PO CAPS: 25.0000 mg | ORAL_CAPSULE | Freq: Once | ORAL | Status: DC

## 2018-11-30 MED ORDER — IBUPROFEN 800 MG PO TABS
800.0000 mg | ORAL_TABLET | Freq: Once | ORAL | Status: AC
  Administered 2018-11-30: 800 mg via ORAL
  Filled 2018-11-30: qty 1

## 2018-11-30 MED ORDER — DIPHENHYDRAMINE HCL 25 MG PO CAPS
50.0000 mg | ORAL_CAPSULE | Freq: Once | ORAL | Status: AC
  Administered 2018-11-30: 50 mg via ORAL
  Filled 2018-11-30: qty 2

## 2018-11-30 MED ORDER — LIDOCAINE 5 % EX PTCH: 1.0000 | MEDICATED_PATCH | CUTANEOUS | 0 refills | Status: DC

## 2018-11-30 NOTE — ED Notes (Signed)
Patient is repeatedly calling out for multiple things, staff has been notified every time. Patient is now calling the [phone] line with her cell phone to call out as well. Patient calls out about every 5 minutes with new complaint. This has been going on for 3.5 hours now. While writing this note Case Mgt called to say that she is also calling her numerous times while on vacation about [place] and pain meds. Case Mgt is speaking with patient trying to tell her that we are not ignoring her but that the staff is busy and will be in when possible. Staff is also in the room at this time talking with the patient.

## 2018-11-30 NOTE — ED Notes (Signed)
Patient transported to X-ray 

## 2018-11-30 NOTE — ED Provider Notes (Signed)
[facility] DEPT Provider Note   CSN: [id number] Arrival date & time: 11/30/18  0757     History   Chief Complaint Chief Complaint  Patient presents with  . Medication Reaction    HPI NERA HAWORTH is a 62 y.o. female.  Patient arrives to the ED with multiple complaints.  She mostly is concerned about medication side effects from her recent schizophrenia medication called Prolixin.  She has had swelling in her legs that according to her social worker has improved significantly.  Patient has just moved to a new apartment and social worker on the phone states that she has a lot of anxiety about this.  It appears that there is not much support for her at home and she typically is supposed to use a walker/cane to ambulate due to difficulty with ambulation at baseline.  However social worker thinks that she is without resources that she needs.  She thinks that she would benefit from case management.  Patient denies any chest pain, shortness of breath.  The history is provided by the patient.  Illness  This is a new problem. The current episode started more than 2 days ago. The problem occurs constantly. The problem has not changed since onset.Pertinent negatives include no chest pain, no abdominal pain, no headaches and no shortness of breath. Nothing aggravates the symptoms. Nothing relieves the symptoms. She has tried nothing for the symptoms. The treatment provided no relief.    Past Medical History:  Diagnosis Date  . Arthritis   . Colostomy in place [facility])   . Depression   . Diverticulitis   . Hypertension     Patient Active Problem List   Diagnosis Date Noted  . Vaginal dryness 07/16/2018  . Rectal bleeding 05/29/2017  . Insomnia 02/21/2016  . Preventative health care 01/11/2016  . PAF (paroxysmal atrial fibrillation) ([place]) 08/18/2015  . Essential hypertension 08/18/2015  . Depression 06/13/2015  . 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  Morbid obesity ([place]) 06/10/2015  . Arthritis- on disability 06/10/2015  . Diverticulitis of colon with perforation s/p colectomy/ostomy 06/09/2015 06/07/2015    Past Surgical History:  Procedure Laterality Date  . COLON RESECTION N/A 06/09/2015   Procedure: SIGMOID COLECTOMY, COLOSTOMY, TAKE DOWN SPLENIC FLECTURE;  Surgeon: Fanny Skates, MD;  Location: [facility] ORS;  Service: General;  Laterality: N/A;  . TUBAL LIGATION       OB History   No obstetric history on file.      Home Medications    Prior to Admission medications   Medication Sig Start Date End Date Taking? Authorizing Provider  hydrochlorothiazide (HYDRODIURIL) 25 MG tablet Take 1 tablet (25 mg total) by mouth daily. 07/16/18  Yes Aldine Contes, MD  meloxicam (MOBIC) 7.5 MG tablet TAKE 1 TABLET BY MOUTH ONCE DAILY FOR NO MORE THAN 7 CONSECUTIVE DAYS FOR ARTHRITIS FLARES. WAIT 1 WEEK BETWEEN COURSES 10/14/18  Yes Aldine Contes, MD  metoprolol tartrate (LOPRESSOR) 50 MG tablet Take 1 tablet (50 mg total) by mouth 2 (two) times daily. 07/16/18  Yes Aldine Contes, MD  naproxen sodium (ALEVE) 220 MG tablet Take 220 mg by mouth daily as needed (pain).   Yes [provider]  Ostomy Supplies (ADHESIVE REMOVER WIPES) MISC 1 Units by Does not apply route daily. 04/17/18  Yes Aldine Contes, MD  Ostomy Supplies (OSTOMY DRAINABLE POUCH/FLANGE) MISC 1 Units by Does not apply route daily. 04/17/18  Yes Aldine Contes, MD  Ostomy Supplies (SECURI-T OSTOMY DEODORANT) LIQD 1 Units  by Does not apply route daily. 04/17/18  Yes Aldine Contes, MD  Ostomy Supplies (SKIN PREP WIPES) MISC 1 Units by Does not apply route daily. 04/17/18  Yes Aldine Contes, MD    Family History Family History  Problem Relation Age of Onset  . Heart disease Mother   . Heart attack Mother   . Hypertension Mother   . Other Maternal Grandmother        ovarian issues    Social History Social History   Tobacco Use  . Smoking status:  Former Research scientist (life sciences)  . Smokeless tobacco: Never Used  . Tobacco comment: QUIT SMOKING ABOUT 18 YEARS AGO(PER PATIENT)  Substance Use Topics  . Alcohol use: No    Alcohol/week: 0.0 standard drinks  . Drug use: No     Allergies   Patient has no known allergies.   Review of Systems Review of Systems  Constitutional: Negative for chills and fever.  HENT: Negative for ear pain and sore throat.   Eyes: Negative for pain and visual disturbance.  Respiratory: Negative for cough and shortness of breath.   Cardiovascular: Positive for leg swelling. Negative for chest pain and palpitations.  Gastrointestinal: Negative for abdominal pain and vomiting.  Genitourinary: Negative for dysuria and hematuria.  Musculoskeletal: Negative for arthralgias and back pain.  Skin: Negative for color change and rash.  Neurological: Negative for seizures, syncope and headaches.  All other systems reviewed and are negative.    Physical Exam Updated Vital Signs  ED Triage Vitals  Enc Vitals Group     BP 11/30/18 0758 99/86     Pulse Rate 11/30/18 0758 73     Resp 11/30/18 0758 16     Temp 11/30/18 0758 98 F (36.7 C)     Temp Source 11/30/18 0758 Oral     SpO2 11/30/18 0758 94 %     Weight 11/30/18 0833 250 lb 3.6 oz (113.5 kg)     Height 11/30/18 0833 5\' 9"  (1.753 m)     Head Circumference --      Peak Flow --      Pain Score 11/30/18 0833 10     Pain Loc --      Pain Edu? --      Excl. in [place]? --     Physical Exam Vitals signs and nursing note reviewed.  Constitutional:      General: She is not in acute distress.    Appearance: She is well-developed.  HENT:     Head: Normocephalic and atraumatic.     Right Ear: Tympanic membrane normal.     Left Ear: Tympanic membrane normal.     Nose: Nose normal.     Mouth/Throat:     Mouth: Mucous membranes are moist.     Pharynx: No oropharyngeal exudate.  Eyes:     Conjunctiva/sclera: Conjunctivae normal.     Pupils: Pupils are equal, round, and  reactive to light.  Neck:     Musculoskeletal: Normal range of motion and neck supple.  Cardiovascular:     Rate and Rhythm: Normal rate and regular rhythm.     Pulses: Normal pulses.     Heart sounds: Normal heart sounds. No murmur.  Pulmonary:     Effort: Pulmonary effort is normal. No respiratory distress.     Breath sounds: Normal breath sounds.  Abdominal:     General: There is no distension.     Palpations: Abdomen is soft.     Tenderness: There is no abdominal  tenderness.  Musculoskeletal: Normal range of motion.     Right lower leg: Edema (trace) present.     Left lower leg: Edema (trace) present.  Skin:    General: Skin is warm and dry.     Capillary Refill: Capillary refill takes less than 2 seconds.  Neurological:     General: No focal deficit present.     Mental Status: She is alert and oriented to person, place, and time.     Cranial Nerves: No cranial nerve deficit.     Sensory: No sensory deficit.     Motor: No weakness.     Coordination: Coordination normal.     Comments: 5+/5 strength throughout, normal sensation, able to ambulate with assistance   Psychiatric:        Mood and Affect: Mood normal.      ED Treatments / Results  Labs (all labs ordered are listed, but only abnormal results are displayed) Labs Reviewed  CBC WITH DIFFERENTIAL/PLATELET - Abnormal; Notable for the following components:      Result Value   Hemoglobin 11.0 (*)    MCH 24.3 (*)    MCHC 29.6 (*)    RDW 17.1 (*)    Platelets 509 (*)    All other components within normal limits  BASIC METABOLIC PANEL - Abnormal; Notable for the following components:   Potassium 3.3 (*)    Glucose, Bld 115 (*)    BUN 31 (*)    Creatinine, Ser 1.31 (*)    GFR calc non Af Amer 44 (*)    GFR calc Af Amer 51 (*)    All other components within normal limits  URINALYSIS, ROUTINE W REFLEX MICROSCOPIC - Abnormal; Notable for the following components:   APPearance HAZY (*)    Leukocytes, UA TRACE (*)     Bacteria, UA RARE (*)    All other components within normal limits    EKG None  Radiology Dg Lumbar Spine Complete  Result Date: 11/30/2018 CLINICAL DATA:  Per EMS- patient states she had a prolixin injection a few days ago for the first time and is now having bilateral lower extremity edema, increased anxiety, and weakness. Optimal images obtained, limited positioning due to patients body habitus. EXAM: LUMBAR SPINE - COMPLETE 4+ VIEW COMPARISON:  CT 07/03/2018 FINDINGS: Normal alignment. Negative for fracture. Exuberant anterior spurring in the visualized lower thoracic spine. Mild narrowing of L3-4 and L4-5 interspaces. Degenerative disc disease L5-S1 with vacuum phenomenon. Bilateral facet DJD L3-S1. surgical staple line in the left pelvis. IMPRESSION: 1. Negative for fracture or other acute bone abnormality. 2. Multilevel degenerative changes as above. Electronically Signed   By: Lucrezia Europe M.D.   On: 11/30/2018 09:38    Procedures Procedures (including critical care time)  Medications Ordered in ED Medications  ibuprofen (ADVIL,MOTRIN) tablet 800 mg (800 mg Oral Given 11/30/18 0859)  diphenhydrAMINE (BENADRYL) capsule 50 mg (50 mg Oral Given 11/30/18 0859)     Initial Impression / Assessment and Plan / ED Course  I have reviewed the triage vital signs and the nursing notes.  Pertinent labs & imaging results that were available during my care of the patient were reviewed by me and considered in my medical decision making (see chart for details).     FANTASHIA SHUPERT is a 62 year old female with history of arthritis, depression, schizophrenia who presents to the ED with concern for medicine side effect.  Patient with normal vitals.  No fever.  Patient with swelling in her  legs ever since she took her Prolixin shot last week.  Patient recently moved to new apartment.  Talked with the social worker on the phone who is heavily involved in this patient's care.  She recently just  found her house and.  Patient has been anxious about this new housing situation.  Social worker states that her leg swelling has improved significantly.  Patient with multiple complaints.  It appears that she complains of mostly back pain.  Denies any chest pain, shortness of breath.  Has no midline spinal tenderness on exam.  She has normal neurological exam.  No urinary retention.  No concern for cauda equina.  She is able to ambulate with assistance.  Patient is supposed to use cane/walker at home but social worker states that patient only has a cane.  Suspect that she would do better with a walker.  She has chronic ambulatory issues.  There is no sign of urine infection on exam.  No significant anemia, electrolyte abnormality, kidney injury.  X-ray of the lumbar spine was unremarkable.  Case management consult was placed and home health orders were placed for patient to get walker and other support at her new housing situation.  She denied any suicidal homicidal ideation.  She was discharged from the ED in good condition.  Given return precautions.  This chart was dictated using voice recognition software.  Despite best efforts to proofread,  errors can occur which can change the documentation meaning.   Final Clinical Impressions(s) / ED Diagnoses   Final diagnoses:  Adverse effect of drug, initial encounter    ED Discharge Orders    None       Lennice Sites, DO 11/30/18 1259

## 2018-11-30 NOTE — ED Triage Notes (Signed)
Per EMS- patient states she had a prolixin injection a few days ago for the first time and is now having bilateral lower extremity edema, increased anxiety, and weakness. EMS reported atrial flutter when they did their EKG. Patient denies history of.. Patient denies CP or SOB.

## 2018-11-30 NOTE — ED Notes (Signed)
Bed: WA07 Expected date:  Expected time:  Means of arrival:  Comments: EMS/med. RXN

## 2018-12-05 ENCOUNTER — Ambulatory Visit: Payer: Self-pay

## 2018-12-05 ENCOUNTER — Encounter: Payer: Self-pay | Admitting: Internal Medicine

## 2018-12-06 ENCOUNTER — Other Ambulatory Visit: Payer: Self-pay

## 2018-12-06 ENCOUNTER — Emergency Department (HOSPITAL_COMMUNITY): Payer: Medicaid Other

## 2018-12-06 ENCOUNTER — Encounter (HOSPITAL_COMMUNITY): Payer: Self-pay | Admitting: Emergency Medicine

## 2018-12-06 ENCOUNTER — Emergency Department (HOSPITAL_COMMUNITY)
Admission: EM | Admit: 2018-12-06 | Discharge: 2018-12-09 | Disposition: A | Discharge status: Home / Self Care | Payer: Medicaid Other | Attending: Emergency Medicine | Admitting: Emergency Medicine

## 2018-12-06 DIAGNOSIS — W010XXA Fall on same level from slipping, tripping and stumbling without subsequent striking against object, initial encounter: Secondary | ICD-10-CM | POA: Diagnosis not present

## 2018-12-06 DIAGNOSIS — Y939 Activity, unspecified: Secondary | ICD-10-CM | POA: Insufficient documentation

## 2018-12-06 DIAGNOSIS — Z87891 Personal history of nicotine dependence: Secondary | ICD-10-CM | POA: Insufficient documentation

## 2018-12-06 DIAGNOSIS — Y999 Unspecified external cause status: Secondary | ICD-10-CM | POA: Insufficient documentation

## 2018-12-06 DIAGNOSIS — Y92009 Unspecified place in unspecified non-institutional (private) residence as the place of occurrence of the external cause: Secondary | ICD-10-CM | POA: Insufficient documentation

## 2018-12-06 DIAGNOSIS — M545 Low back pain: Secondary | ICD-10-CM | POA: Insufficient documentation

## 2018-12-06 DIAGNOSIS — Z933 Colostomy status: Secondary | ICD-10-CM | POA: Insufficient documentation

## 2018-12-06 DIAGNOSIS — Z79899 Other long term (current) drug therapy: Secondary | ICD-10-CM | POA: Diagnosis not present

## 2018-12-06 DIAGNOSIS — I10 Essential (primary) hypertension: Secondary | ICD-10-CM | POA: Diagnosis not present

## 2018-12-06 DIAGNOSIS — W19XXXA Unspecified fall, initial encounter: Secondary | ICD-10-CM

## 2018-12-06 HISTORY — DX: Schizophrenia, unspecified: F20.9

## 2018-12-06 MED ORDER — SKIN PREP WIPES MISC: 1.0000 [IU] | Freq: Every day | Status: DC

## 2018-12-06 MED ORDER — METOPROLOL TARTRATE 25 MG PO TABS
50.0000 mg | ORAL_TABLET | Freq: Two times a day (BID) | ORAL | Status: DC
  Administered 2018-12-06 – 2018-12-09 (×5): 50 mg via ORAL
  Filled 2018-12-06 (×7): qty 2

## 2018-12-06 MED ORDER — OSTOMY DRAINABLE POUCH/FLANGE MISC: 1.0000 [IU] | Freq: Every day | Status: DC

## 2018-12-06 MED ORDER — SECURI-T OSTOMY DEODORANT LIQD: 1.0000 [IU] | Freq: Every day | Status: DC

## 2018-12-06 MED ORDER — ACETAMINOPHEN 325 MG PO TABS
650.0000 mg | ORAL_TABLET | Freq: Four times a day (QID) | ORAL | Status: DC | PRN
  Administered 2018-12-06 – 2018-12-09 (×8): 650 mg via ORAL
  Filled 2018-12-06 (×9): qty 2

## 2018-12-06 MED ORDER — HYDROCHLOROTHIAZIDE 25 MG PO TABS
25.0000 mg | ORAL_TABLET | Freq: Every day | ORAL | Status: DC
  Administered 2018-12-06 – 2018-12-09 (×4): 25 mg via ORAL
  Filled 2018-12-06 (×4): qty 1

## 2018-12-06 MED ORDER — ADHESIVE REMOVER WIPES MISC: 1.0000 [IU] | Freq: Every day | Status: DC

## 2018-12-06 NOTE — ED Notes (Signed)
Wheelchair to be picked up CSW within the hour - patient okay to go home via [place].

## 2018-12-06 NOTE — ED Notes (Signed)
Patient had bowel movement in bed, patient was cleaned and changed. Linens changed as well. Patient did not eat her dinner but agreed to eating some chicken broth.

## 2018-12-06 NOTE — ED Notes (Signed)
CSW speaking with patient at this time.

## 2018-12-06 NOTE — ED Notes (Signed)
Patient sitting up in bed eating dinner at this time.

## 2018-12-06 NOTE — ED Provider Notes (Addendum)
[facility] EMERGENCY DEPARTMENT Provider Note   CSN: [id number] Arrival date & time: 12/06/18  0037   History   Chief Complaint Chief Complaint  Patient presents with  . Fall  . Back Pain    HPI Deanna Aguilar is a 63 y.o. female with a hx of HTN, PAF, schizophrenia, and colostomy in place s/p colon resection who presents to the ED via EMS s/p mechanical fall at home with complaints of back pain. Patient states she lives at home alone and has difficulty ambulating at baseline. She relays she typically requires assistance to walk, and really only does transitioning from chair to chair type movements. Today she was transitioning from her chair to standing, miss-stepped, and fell/slid backwards. She denies head injury or LOC, only area of pain from fall is the lower back, she was however having lower back pain prior to the fall. Was unable to get up on her own. Denies prodromal lightheadedness, dizziness, chest pain, or dyspnea. States that she has personal assistance with staff coming to the house to help her daily, but does not want the girl that stays overnight to come anymore,  Patient does have a Education officer, museum that helps her with things regularly.   HPI  Past Medical History:  Diagnosis Date  . Arthritis   . Colostomy in place [facility])   . Depression   . Diverticulitis   . Hypertension   . Schizophrenia [facility])     Patient Active Problem List   Diagnosis Date Noted  . Vaginal dryness 07/16/2018  . Rectal bleeding 05/29/2017  . Insomnia 02/21/2016  . Preventative health care 01/11/2016  . PAF (paroxysmal atrial fibrillation) ([place]) 08/18/2015  . Essential hypertension 08/18/2015  . Depression 06/13/2015  . 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 Morbid obesity ([place]) 06/10/2015  . Arthritis- on disability 06/10/2015  . Diverticulitis of colon with perforation s/p colectomy/ostomy 06/09/2015 06/07/2015    Past Surgical History:  Procedure Laterality Date  .  COLON RESECTION N/A 06/09/2015   Procedure: SIGMOID COLECTOMY, COLOSTOMY, TAKE DOWN SPLENIC FLECTURE;  Surgeon: Fanny Skates, MD;  Location: [facility] ORS;  Service: General;  Laterality: N/A;  . TUBAL LIGATION       OB History   No obstetric history on file.      Home Medications    Prior to Admission medications   Medication Sig Start Date End Date Taking? Authorizing Provider  hydrochlorothiazide (HYDRODIURIL) 25 MG tablet Take 1 tablet (25 mg total) by mouth daily. 07/16/18   Aldine Contes, MD  lidocaine (LIDODERM) 5 % Place 1 patch onto the skin daily for 30 doses. Remove & Discard patch within 12 hours or as directed by MD 11/30/18 12/30/18  Curatolo, Adam, DO  meloxicam (MOBIC) 7.5 MG tablet TAKE 1 TABLET BY MOUTH ONCE DAILY FOR NO MORE THAN 7 CONSECUTIVE DAYS FOR ARTHRITIS FLARES. WAIT 1 WEEK BETWEEN COURSES 10/14/18   Aldine Contes, MD  metoprolol tartrate (LOPRESSOR) 50 MG tablet Take 1 tablet (50 mg total) by mouth 2 (two) times daily. 07/16/18   Aldine Contes, MD  naproxen sodium (ALEVE) 220 MG tablet Take 220 mg by mouth daily as needed (pain).    [provider]  Ostomy Supplies (ADHESIVE REMOVER WIPES) MISC 1 Units by Does not apply route daily. 04/17/18   Aldine Contes, MD  Ostomy Supplies (OSTOMY DRAINABLE POUCH/FLANGE) MISC 1 Units by Does not apply route daily. 04/17/18   Aldine Contes, MD  Ostomy Supplies (SECURI-T OSTOMY DEODORANT) LIQD 1 Units  by Does not apply route daily. 04/17/18   Aldine Contes, MD  Ostomy Supplies (SKIN PREP WIPES) MISC 1 Units by Does not apply route daily. 04/17/18   Aldine Contes, MD    Family History Family History  Problem Relation Age of Onset  . Heart disease Mother   . Heart attack Mother   . Hypertension Mother   . Other Maternal Grandmother        ovarian issues    Social History Social History   Tobacco Use  . Smoking status: Former Research scientist (life sciences)  . Smokeless tobacco: Never Used  . Tobacco comment: QUIT  SMOKING ABOUT 18 YEARS AGO(PER PATIENT)  Substance Use Topics  . Alcohol use: No    Alcohol/week: 0.0 standard drinks  . Drug use: No     Allergies   Patient has no known allergies.   Review of Systems Review of Systems  Constitutional: Negative for chills and fever.  Respiratory: Negative for shortness of breath.   Cardiovascular: Negative for chest pain.  Gastrointestinal: Negative for abdominal pain, nausea and vomiting.  Musculoskeletal: Positive for back pain.  Neurological: Negative for dizziness, weakness, light-headedness, numbness and headaches.  All other systems reviewed and are negative.  Physical Exam Updated Vital Signs BP (!) 144/77 (BP Location: Right Arm)   Pulse 86   Temp 98.6 F (37 C) (Oral)   Resp 16   Ht 5\' 10"  (1.778 m)   Wt 104.3 kg   LMP  (Exact Date)   SpO2 99%   BMI 33.00 kg/m   Physical Exam Vitals signs and nursing note reviewed.  Constitutional:      General: She is not in acute distress.    Appearance: She is well-developed. She is not toxic-appearing.  HENT:     Head: Normocephalic and atraumatic. No raccoon eyes or Battle's sign.     Right Ear: No hemotympanum.     Left Ear: No hemotympanum.     Mouth/Throat:     Mouth: Mucous membranes are moist.  Eyes:     General:        Right eye: No discharge.        Left eye: No discharge.     Conjunctiva/sclera: Conjunctivae normal.  Neck:     Musculoskeletal: Neck supple. No neck rigidity or spinous process tenderness.  Cardiovascular:     Rate and Rhythm: Normal rate.  Pulmonary:     Effort: Pulmonary effort is normal. No respiratory distress.     Breath sounds: Normal breath sounds. No wheezing, rhonchi or rales.  Chest:     Chest wall: No tenderness.  Abdominal:     General: There is no distension.     Palpations: Abdomen is soft.     Tenderness: There is no abdominal tenderness.     Comments: Colostomy in place   Musculoskeletal:     Comments: Trace symmetric edema to  lower legs without overlying skin changes. No obvious deformity, erythema, ecchymosis, warmth, or open wounds. Upper/lower extremities: Moving extremities at all joints.  No point/focal bony tenderness. Back: Patient is diffusely tender throughout the lumbar region including midline and bilateral paraspinal muscles.  There is no point/focal vertebral tenderness.  No palpable step-off.  Skin:    General: Skin is warm and dry.     Findings: No rash.  Neurological:     Mental Status: She is alert.     Comments: Clear speech.  Sensation grossly intact bilateral upper and lower extremities.  5 out of 5 symmetric grip  strength.  5 out of 5 strength of plantar dorsiflexion bilaterally.  Patient able to stand w/ assistance, deferred initial ambulation trial.   Psychiatric:        Behavior: Behavior normal.      ED Treatments / Results  Labs (all labs ordered are listed, but only abnormal results are displayed) Labs Reviewed - No data to display  EKG None  Radiology No results found.  Procedures Procedures (including critical care time)  Medications Ordered in ED Medications - No data to display   Initial Impression / Assessment and Plan / ED Course  I have reviewed the triage vital signs and the nursing notes.  Pertinent labs & imaging results that were available during my care of the patient were reviewed by me and considered in my medical decision making (see chart for details).  Patient presents to the ED s/p mechanical fall with complaint of back pain. No serious head/neck/back injuries noted. CT head/Cspine not obtained secondary to [place] CT rules. No thoracic tenderness. L spine xray negative for fracture/dislocation. No acute neuro deficits noted. Kenney Houseman, Education officer, museum w/ patient, is at bedside, Kenney Houseman expressed multiple concerns: - Lower extremity edema which has been there for "awhile" no acute change, seems consistent with edema noted on previous provider note, we discussed  lab work from prior visit, no dyspnea/chest pain, does not appear to have infection as underlying etiology, possibly dependent edema- recommended elevation with PCP follow up.  - Back pain- Per Tonya patient has been sitting in rocking chair majority of the day without movement, patient & Kenney Houseman believe this is likely leading to stiffness/discomfort. No reported red flags, hx of similar pain, xray negative for acute change.  - Home care situation. Given patient difficulty with mobility at home hospital CM/CSW- ultimately options based on insurance seemed to be SNF without therapy vs. @ home care with [facility] services. After thorough discussion with the patient & Tonya by myself as well as CSW Jeanette Caprice with involvement from [place] w/ CM- initially decision was to go home w/ wheelchair therefore this was ordered, however now patient without option of at home assistance. She does not seem safe to go home without care and her limited mobility. Will likely require ER boarding w/ difficult placement. CSW attempting to find placement. PT/OT evaluation & tx ordered. Home meds re-ordered. Carb modified diet.   Findings and plan of care discussed with supervising physician Dr. Billy Fischer- in agreement.      Durable Medical Equipment  (From admission, onward)         Start     Ordered   12/06/18 1143  For home use only DME wheelchair cushion (seat and back)  Once     12/06/18 1142        Purpose: inability to ambulate independently     Final Clinical Impressions(s) / ED Diagnoses   Final diagnoses:  Fall, initial encounter    ED Discharge Orders    None       Amaryllis Dyke, PA-C 12/06/18 1143    Amaryllis Dyke, PA-C 12/06/18 1435    Gareth Morgan, MD 12/07/18 1038

## 2018-12-06 NOTE — Evaluation (Addendum)
Physical Therapy Evaluation Patient Details Name: Deanna Aguilar MRN: [id number] DOB: 1956/05/10 Today's Date: 12/06/2018   History of Present Illness  Pt is a 63 y.o. F with significant PMH of HTN, PAF, schizophrenia, and colostomy who presents s/p mechanical fall at home with complaints of back pain.  Clinical Impression  Pt admitted with above diagnosis. Pt currently with functional limitations due to the deficits listed below (see PT Problem List). Patient reporting being sedentary the past 3 weeks secondary to receiving a "shot." On PT evaluation, patient presenting with cognitive deficits, weakness, LE pain and decreased activity tolerance. Requiring up to moderate assistance for bed mobility and unable to stand. However, according to NT, patient took steps forward/backwards this morning. Pt will benefit from skilled PT to increase their independence and safety with mobility to allow discharge to SNF.     Follow Up Recommendations SNF; Supervision/Assistance - 24 hour for all mobility, ADL's and medication mgt    Equipment Recommendations  Wheelchair (measurements PT)    Recommendations for Other Services       Precautions / Restrictions Precautions Precautions: Fall Precaution Comments: colostomy Restrictions Weight Bearing Restrictions: No      Mobility  Bed Mobility Overal bed mobility: Needs Assistance Bed Mobility: Supine to Sit;Sit to Supine     Supine to sit: Min guard Sit to supine: Mod assist   General bed mobility comments: Min guard for supine > sit for safety. Mod assist for sit > supine with negotiation of BLE   Transfers                 General transfer comment: Unable to achieve significant hip clearance with max assist  Ambulation/Gait                Stairs            Wheelchair Mobility    Modified Rankin (Stroke Patients Only)       Balance Overall balance assessment: Needs assistance Sitting-balance support: Feet  supported Sitting balance-Leahy Scale: Fair                                       Pertinent Vitals/Pain Pain Assessment: Faces Faces Pain Scale: Hurts even more Pain Location: left anterior thigh radiating to shin Pain Descriptors / Indicators: Burning;Aching;Sharp;Shooting Pain Intervention(s): Monitored during session    Home Living Family/patient expects to be discharged to:: Private residence Living Arrangements: Alone   Type of Home: Apartment Home Access: Stairs to enter   Technical brewer of Steps: 2 Home Layout: One level Ransom: [name]      Prior Function Level of Independence: Needs assistance   Gait / Transfers Assistance Needed: states she used cane for ambulation prior to 3 weeks ago  ADL's / Homemaking Assistance Needed: prior caregiver assists with ADL's  Comments: Pt is a questionable historian     Hand Dominance        Extremity/Trunk Assessment   Upper Extremity Assessment Upper Extremity Assessment: RUE deficits/detail;LUE deficits/detail RUE Deficits / Details: Strength 4/5 LUE Deficits / Details: Strength 4/5    Lower Extremity Assessment Lower Extremity Assessment: RLE deficits/detail;LLE deficits/detail RLE Deficits / Details: MMT: ankle dorsiflexion 2/5, knee extension 3/5, hip flexion 2/5. Poor effort LLE Deficits / Details: MMT: ankle dorsiflexion 2/5, knee extension 3/5, hip flexion 2/5. Poor effort.       Communication   Communication: No  difficulties  Cognition Arousal/Alertness: Awake/alert Behavior During Therapy: Flat affect Overall Cognitive Status: No family/caregiver present to determine baseline cognitive functioning                                 General Comments: Very slow processing and allow increase time for responsse      General Comments      Exercises     Assessment/Plan    PT Assessment Patient needs continued PT services  PT Problem List  Decreased strength;Decreased activity tolerance;Decreased balance;Decreased mobility;Decreased cognition;Obesity       PT Treatment Interventions DME instruction;Gait training;Functional mobility training;Therapeutic activities;Therapeutic exercise;Balance training;Patient/family education;Wheelchair mobility training    PT Goals (Current goals can be found in the Care Plan section)  Acute Rehab PT Goals Patient Stated Goal: "move up in bed." PT Goal Formulation: With patient Time For Goal Achievement: 12/20/18 Potential to Achieve Goals: Fair    Frequency Min 2X/week   Barriers to discharge Decreased caregiver support      Co-evaluation               AM-PAC PT "6 Clicks" Mobility  Outcome Measure Help needed turning from your back to your side while in a flat bed without using bedrails?: A Little Help needed moving from lying on your back to sitting on the side of a flat bed without using bedrails?: A Little Help needed moving to and from a bed to a chair (including a wheelchair)?: Total Help needed standing up from a chair using your arms (e.g., wheelchair or bedside chair)?: Total Help needed to walk in hospital room?: Total Help needed climbing 3-5 steps with a railing? : Total 6 Click Score: 10    End of Session Equipment Utilized During Treatment: Gait belt Activity Tolerance: Patient limited by fatigue Patient left: in bed;with call bell/phone within reach Nurse Communication: Mobility status PT Visit Diagnosis: Other abnormalities of gait and mobility (R26.89);Muscle weakness (generalized) (M62.81)    Time: 1440-1510 PT Time Calculation (min) (ACUTE ONLY): 30 min   Charges:   PT Evaluation $PT Eval Moderate Complexity: 1 Mod PT Treatments $Therapeutic Activity: 8-22 mins       Ellamae Sia, PT, DPT Acute Rehabilitation Services Pager [phone] Office [phone]  Willy Eddy 12/06/2018, 3:56 PM

## 2018-12-06 NOTE — Progress Notes (Signed)
CSW received a call from [place] the pt's social worker with [facility] who stated pt's sister Berneda Rose at ph: [phone].  Pt's sister is aware of pt's status and is aware that at this time an attempt at placement is in process, although there are a lot of obstacles to the pt being placed.  Pt's social worker with [facility] is aware and voiced understanding.  CSW will continue to follow for D/C needs.  Alphonse Guild. Beatryce Colombo, LCSW, LCAS, CSI Clinical Social Worker Ph: [phone]

## 2018-12-06 NOTE — Progress Notes (Signed)
2nd shift ED CSW covering [facility] ED:  Per 1st shift ED CSW CSW Asst Director is willing to provide a 30-day LOG for a 30-day stay, but will NOT provided LOG for PT.  Per 1st shift ED CSW pt is not likely to receive authorized SNF days due to pt having schizophrenia, and due to Medicaid not likely to pay for PT, and due to [place] leadership being able to provide a 30-day LOG for a SNF stay, but is not able to provide a 30-day LOG for PT which is why three appropriate local facilities [place], [place] and [facility].  CSW will continue to follow for D/C needs.  Alphonse Guild. Prinston Kynard, LCSW, LCAS, CSI Clinical Social Worker Ph: [phone]

## 2018-12-06 NOTE — ED Notes (Signed)
Patient reports that she no longer has help at home - per social work, patient does not want whoever was helping with her meals and bathing anymore.

## 2018-12-06 NOTE — Progress Notes (Addendum)
2:46pm-CSW spoke with Tammy from [place] and [place] and was informed that Ebony Hail would be by to assess pt today.   1:43pm-CSW aware that pt is now holding for placement. CSW has faxed out information to facilities at this time. CSW awaits response from facilities. Per Edwyna Ready he is agreeable to do 30 day LOG is facility is found.   CSW as well as RNCM have all explained to PA, Education officer, museum, and RN the barriers in pt placement which ultimately may end up with pt not being placed.   CSW spoke once more with Social Worker Kenney Houseman and was informed that the care givers pt had coming out pt is no longer wanting them to come. Per Education officer, museum at bedside, she is unable to take pt as well as pt is unable to be at home alone. CSW spoke with PA Sam about this and Sam expressed that if pt is unable to have support at home then pt wouldn't be discharge home.    CSW has explained barriers to placement to all parties involved in case at this time and placement is still being suggested as the best option. CSW spoke with [name] from [facility] and was informed that since pt has no payor source, this would be the barrier to placing pt into any facility. CSW spoke with Laurena Spies and was informed that if pt hasn't been taking medications in the past (reason for pt getting shot per Education officer, museum) then they wouldn't offer more than likely. CSW spoke with Tammy from [place] and [place] and was informed that she would look at pt but getting a PASAR for pt may be the challenge if pt doesn't already have a PASAR. CSW still following for further needs.     Virgie Dad Broadus Costilla, MSW, [facility] Clinical Social Worker [phone]

## 2018-12-06 NOTE — NC FL2 (Signed)
[place] LEVEL OF CARE SCREENING TOOL     IDENTIFICATION  Patient Name: Deanna Aguilar Birthdate: 28-Jan-1956 Sex: female Admission Date (Current Location): 12/06/2018  [facility] and [place] Number:  Herbalist and Address:  [place]. [facility], [place] [address] [place], [place] [address]      Provider Number: [id number]  Attending Physician Name and Address:  Gareth Morgan, MD  Relative Name and Phone Number:       Current Level of Care: Hospital Recommended Level of Care: [place] Prior Approval Number:    Date Approved/Denied:   PASRR Number:   [id number]   Discharge Plan: SNF    Current Diagnoses: Patient Active Problem List   Diagnosis Date Noted  . Vaginal dryness 07/16/2018  . Rectal bleeding 05/29/2017  . Insomnia 02/21/2016  . Preventative health care 01/11/2016  . PAF (paroxysmal atrial fibrillation) ([place]) 08/18/2015  . Essential hypertension 08/18/2015  . Depression 06/13/2015  . Colostomy in place [facility]) 06/13/2015  . Morbid obesity ([place]) 06/10/2015  . Arthritis- on disability 06/10/2015  . Diverticulitis of colon with perforation s/p colectomy/ostomy 06/09/2015 06/07/2015    Orientation RESPIRATION BLADDER Height & Weight     Self, Time, Situation, Place  Normal Incontinent Weight: 230 lb (104.3 kg) Height:  5\' 10"  (177.8 cm)  BEHAVIORAL SYMPTOMS/MOOD NEUROLOGICAL BOWEL NUTRITION STATUS  (no behaviors documented at this time. )   Colostomy Diet(plese see AVS.)  AMBULATORY STATUS COMMUNICATION OF NEEDS Skin   Extensive Assist Verbally Normal                       Personal Care Assistance Level of Assistance  Bathing, Feeding, Dressing Bathing Assistance: Maximum assistance Feeding assistance: Limited assistance Dressing Assistance: Maximum assistance     Functional Limitations Info  Sight, Hearing, Speech Sight Info: Adequate Hearing Info: Adequate Speech Info: Adequate     SPECIAL CARE FACTORS FREQUENCY  PT (By licensed PT), OT (By licensed OT)     PT Frequency: 5 times a week  OT Frequency: 5 times a week             Contractures Contractures Info: Not present    Additional Factors Info  Code Status, Allergies Code Status Info: Prior  Allergies Info: NKA           Current Medications (12/06/2018):  This is the current hospital active medication list No current facility-administered medications for this encounter.    Current Outpatient Medications  Medication Sig Dispense Refill  . hydrochlorothiazide (HYDRODIURIL) 25 MG tablet Take 1 tablet (25 mg total) by mouth daily. 90 tablet 3  . lidocaine (LIDODERM) 5 % Place 1 patch onto the skin daily for 30 doses. Remove & Discard patch within 12 hours or as directed by MD 30 patch 0  . meloxicam (MOBIC) 7.5 MG tablet TAKE 1 TABLET BY MOUTH ONCE DAILY FOR NO MORE THAN 7 CONSECUTIVE DAYS FOR ARTHRITIS FLARES. WAIT 1 WEEK BETWEEN COURSES 90 tablet 0  . metoprolol tartrate (LOPRESSOR) 50 MG tablet Take 1 tablet (50 mg total) by mouth 2 (two) times daily. 180 tablet 3  . naproxen sodium (ALEVE) 220 MG tablet Take 220 mg by mouth daily as needed (pain).    Oneta Rack Supplies (ADHESIVE REMOVER WIPES) MISC 1 Units by Does not apply route daily. 50 each 0  . Ostomy Supplies (OSTOMY DRAINABLE POUCH/FLANGE) MISC 1 Units by Does not apply route daily. 20 each 0  .  Ostomy Supplies (SECURI-T OSTOMY DEODORANT) LIQD 1 Units by Does not apply route daily. 1 Bottle o  . Ostomy Supplies (SKIN PREP WIPES) MISC 1 Units by Does not apply route daily. 50 each 0     Discharge Medications: Please see discharge summary for a list of discharge medications.  Relevant Imaging Results:  Relevant Lab Results:   Additional Information [phone]. Pt will be coming on LOG and pt also has a colostomy bag.   Wetzel Bjornstad, LCSWA

## 2018-12-06 NOTE — ED Triage Notes (Signed)
Pt BIB [facility] for a fall. EMS advised the pt fell with a care giver present and she was still on the floor upon their arrival. EMS advised the pt passed their spinal test, no tenderness, deformity, or step off. EMS advised the pt was hypertensive for them. EMS advised no LOC or obvious injury.   Pt states her care giver would not help her so she attempted to get up on her own and fell. Pt states her care giver would then not help her get out of the wheelchair.

## 2018-12-06 NOTE — Progress Notes (Addendum)
11:58am-CSW spoke with PA Deanna Aguilar and was informed that pt has chosen to return back home at this time. CSW spoke with RNCM to confirm that pt is able to get a wheelchair for home use. RNCM expressed that she would work on getting wheelchair for pt at this time.  CSW aware that pt will be transportation back hone by PTAR and that key is in the mailbox for pt to get into the home. Plan is for Deanna Aguilar to take pt' wheelchair home once delivered. CSW provided Deanna Aguilar with information for getting further PCS Services for pt and sh expressed that's he would let pt's sister set this up from here.  There are no further CSW needs. CSW will sign off at this time.   11:20am-CSW spoke with Deanna Aguilar Education officer, museum through [employer]. CSW was advised that she is pt's housing Education officer, museum and is working to make sure that pt's medical needs are addressed as pt lives at home alone. CSW spoke with Surveyor, quantity to see what other option are open to pt being that pt has Medicaid which will not cover the therapies that pt is needing -only room and board and medications. CSW was advised by both RNCM and AD that the only possible option would be for pt to apply for [facility] Services through Medicaid. CSW was given the information for pt or social worker to goggle search N[place]icaidpcs and to utilize the application on this website to apply for increased services through [facility] if pt is going on home. CSW expressed to Deanna Aguilar that this is the only thing we can appear to offer at this time and she expressed "it is usually the [facility] or Social Workers duty to set this up". CSW advised her that CSW usually doesn't  work with [employer] and suggested that [facility] does.   CSW was informed by Deanna Aguilar that she is asking for pt to be observed due to pt's swelling in legs, back pain, not sleeping ,and the shot that pt received while at [facility]. CSW expressed to her that CSW would update PA seeing pt and have them speak with her. CSW to follow up  once further decision son needs have been made.   10:25am-CSW spoke wiht PA, pt's sister, Social worker Engineer, structural, and RNCm covering at [employer] ED. Staff expressed to pt and social worker at bedside the barriers to placing pt as well a the barriers to home health services for pt. Social worker expressed that pt is unable to be at home alone and needs 24 hour around the clock care. CSW offered to reach out to [facility] as requested by pt and Education officer, museum and was informed that per [name] with [facility] they have Medicaid beds, however if pt is in need of therapy then Medicaid wouldn't cover it. Per [name] the only things that Medicaid would cover would be room and board and medications.   CSW explained this to pt and Education officer, museum and the question of social worker remains "what happens to the pt?". CSW advised Deanna Aguilar that CSW would graciously offer and refer pt to other resources that CSW is aware such as APS and social worker expressed that they wouldn't be able to help much. Social worker at bedside asked will the hospital discharge pt home and CSW advised her that this is up to the PA.    CSW again expressed that per [name] placement is not an option as pt doesn't have a payor for therapy that social worker is asking that  pt get. CSW has updated PA and asked that she follow up with pt and social worker at bedside. CSW has also reached out to Assistance Director to see what other resources are open to pt.  CSW consulted by PA Deanna Aguilar requesting that pt have home health services set up at home. CSW updated RNCM of this need at this time. CSW also spoke with pt's social worker Deanna Aguilar [phone]. CSW as informed that the social worker would be at the hospital to speak with PA soon. Social worker sought further details on pt being  placed into a facility. CSW advised that if PA thought that this level of care was needed then CSW would proceed with assisting in finding placement. CSW also advised that if pt is set up  with home health services then home health social worker would be able to aid in placing pt from home.   CSW will follow for further needs.      [name], MSW, [place] [facility] [facility] Clinical Social Worker [phone]

## 2018-12-06 NOTE — Care Management Note (Signed)
Case Management Note  CM consulted for assistance with transition of care planning.  CM via speaker phone and CSW at bedside spoke with pt and "housing case worker" with concerns for the pt's well being.  CM discussed Medicaid PCS and how much Medicaid would pay for with [facility].  CM discussed the reasons for no HH orders for an aid on last hospital admission at [facility] based on what CM was hearing.  CM explained that if [facility] was initiated for the pt there would only be 5 visits in this calender year which would not assist the pt based on the needs CM was hearing voiced.  By the end of the conversation, CM confirmed that what CM was hearing voiced was the request for facility placement.  Pt and "housing case worker" confirmed the request.  CM discussed the difference between SNF and ALF as pt and friend (?) were requesting an ALF.  CM advised that it appears per phone conversation pt needs were at SNF level at this time and she could later transfer to an ALF once she improved back to her baseline from a few months ago.  CM will defer to CSW for further pt needs.    CSW consulted CM again with a request for a wheelchair.  Orders placed.  CM contacted Jermaine with [place] [facility] who will bring the wheelchair to the pt prior to transportation home via [place].  Family/friends at the bedside with transport wheelchair to pt's home.  No further CM needs noted at this time.  Ketzaly Cardella, Benjaman Lobe, RN 12/06/2018, 10:08 AM

## 2018-12-06 NOTE — Care Management Note (Signed)
Case Management Note  CM updated on status of pt's transition of care plan.  CM placed a PT eval and treat order with a comment for a return call to be able to advise of pt's needs.  CM will advise that PT will need work with pt regularly for potential D/C from the ED prior to being accepted at a facility.  No further CM needs noted at this time.  Saraiya Kozma, Benjaman Lobe, RN 12/06/2018, 1:35 PM

## 2018-12-07 MED ORDER — ZOLPIDEM TARTRATE 5 MG PO TABS
5.0000 mg | ORAL_TABLET | Freq: Once | ORAL | Status: AC
  Administered 2018-12-08: 5 mg via ORAL
  Filled 2018-12-07: qty 1

## 2018-12-07 NOTE — ED Notes (Addendum)
Pt called out to ask for room temperature turned down. No other complaints at this time. Pt resting comfortably.

## 2018-12-07 NOTE — ED Notes (Signed)
Pt asked for covers to be adjusted. This tech did so, and Pt stated she was comfortable. No other complaints at this time.

## 2018-12-07 NOTE — ED Notes (Signed)
Pt called out to notify this tech that she had produced a BM. Pt was assessed and found to be clean and dry. Pt was notified this is likely the [place] she feels. Pt verbalized understanding. No other complaints at this time.

## 2018-12-07 NOTE — ED Notes (Signed)
Pt fed herself breakfast. Pt's SW/ACT person called earlier and requested for pt to turn on her phone as they have been attempting to call her. Gave pt her purse from the counter - pt removed cell phone, turned it on, and attempted to plug in but unable to do so for pt d/t does not have block to plug in to wall.

## 2018-12-07 NOTE — ED Notes (Signed)
PureWick applied to pt d/t pt noted w/urinary incont. Pt states she has been unable to care for herself x 3 weeks d/t injection she received for Schizophrenia. States she feels medication should be out of her system by "the 14th".

## 2018-12-07 NOTE — ED Notes (Signed)
Pt called out again for the room temperature to be turned down; Pt was informed that the room temperature was at the lowest setting. Pt verbalized understanding. No other complaints at this time.

## 2018-12-07 NOTE — ED Notes (Signed)
Wet cloth given to pt as requested.

## 2018-12-07 NOTE — ED Provider Notes (Signed)
Pt sitting up.  She denies any complaints currently.  Vitals stable    Sidney Ace 12/07/18 0809    Davonna Belling, MD 12/07/18 1530

## 2018-12-07 NOTE — Progress Notes (Addendum)
12/07/2018: CSW spoke with [name] with admissions in [place] about patient's referral. CSW noted all LOG's would need to be staffed with administration who would not be available until Monday. PT was additionally denied by the other two [place] and [place]. CSW will continue to follow.  CSW noted addended PT note clarifying recommendation of SNF. CSW resent referrals with therapy notes attached.   Lamonte Richer, LCSW, [place] Worker II [phone]

## 2018-12-07 NOTE — ED Notes (Signed)
Pt called out to ask for a cup of ice water. Pt given ice water.

## 2018-12-07 NOTE — ED Notes (Addendum)
Pt talking w/her sister on phone. Pt assisted to more comfortable position as requested. Sister requested for RN to advise dr of pt's bil feet swelling and ulcer in her mouth. Legs elevated.

## 2018-12-07 NOTE — ED Notes (Signed)
Pt called out to be moved up and adjusted in bed. RN Ryland and this tech helped Pt do so. Pt has no other complaints at this time.

## 2018-12-07 NOTE — ED Notes (Signed)
Pt called out to inform staff that she felt frustrated. This tech went in to talk with Pt about her frustration. Pt stated that she felt this way because of her limited movement. Pt was asked if staff could do anything to make her more comfortable, Pt stated "I could use some help going to sleep." RN notified.

## 2018-12-07 NOTE — Care Management CC44 (Signed)
Contacted by ED CSW concerning patient PT recommendations not noted as level of care for SNF. CM reviewed PT note which states recommendations is Skilled Rehab. CM will contact PT  concerning the PT eval and recommendations.

## 2018-12-08 MED ORDER — ZOLPIDEM TARTRATE 5 MG PO TABS
5.0000 mg | ORAL_TABLET | Freq: Once | ORAL | Status: AC
  Administered 2018-12-08: 5 mg via ORAL
  Filled 2018-12-08: qty 1

## 2018-12-08 NOTE — ED Notes (Addendum)
Pt's HR noted to be 145 - EKG being performed.

## 2018-12-08 NOTE — ED Notes (Signed)
Pt has called out several times requesting many different things.  Asked that we take her socks off then asked if we could put them back on.  Asked to be repositioned in the bed, stated that she could not move at all. Pt reminded that she was able to walk w/ PT today.  Knowledged that she might feel weak but she is able to move.  RN and Tec assisted pt in repositioning.

## 2018-12-08 NOTE — Evaluation (Signed)
Occupational Therapy Evaluation Patient Details Name: Deanna Aguilar MRN: [id number] DOB: [birth year] Today's Date: 12/08/2018    History of Present Illness Pt is a 63 y.o. F with significant PMH of HTN, PAF, schizophrenia, and colostomy who presents s/p mechanical fall at home with complaints of back pain.   Clinical Impression   Pt reporting she lived alone and performed BADLs; however, unsure of reliability due to cognition. Pt currently requiring Min A for UB ADLs, Mod-Max A for LB ADLs, and Mod A +2 for functional mobility with RW. Pt presenting with decreased strength, balance, cognition, and activity tolerance. Pt would benefit from further acute OT to facilitate safe dc. Recommend dc to SNF for further OT to optimize safety, independence with ADLs, and return to PLOF.      Follow Up Recommendations  SNF;Supervision/Assistance - 24 hour    Equipment Recommendations  Other (comment)(Defer to next venue)    Recommendations for Other Services       Precautions / Restrictions Precautions Precautions: Fall Precaution Comments: colostomy Restrictions Weight Bearing Restrictions: No      Mobility Bed Mobility Overal bed mobility: Needs Assistance Bed Mobility: Supine to Sit     Supine to sit: Min assist;HOB elevated     General bed mobility comments: Min A for initating movements with RLE. Pt using bed rails to pull herself to EOB.   Transfers Overall transfer level: Needs assistance Equipment used: Rolling walker (2 wheeled) Transfers: Sit to/from Stand Sit to Stand: Mod assist;+2 physical assistance;From elevated surface         General transfer comment: Mod A +2 to power up into standing and cues for hand palcement.     Balance Overall balance assessment: Needs assistance Sitting-balance support: Feet supported Sitting balance-Leahy Scale: Fair     Standing balance support: Bilateral upper extremity supported;During functional activity Standing  balance-Leahy Scale: Poor Standing balance comment: Reliant on UE support                           ADL either performed or assessed with clinical judgement   ADL Overall ADL's : Needs assistance/impaired Eating/Feeding: Set up;Supervision/ safety;Sitting   Grooming: Supervision/safety;Set up;Sitting   Upper Body Bathing: Minimal assistance;Sitting   Lower Body Bathing: Moderate assistance;+2 for physical assistance;Sit to/from stand   Upper Body Dressing : Minimal assistance;Sitting   Lower Body Dressing: Maximal assistance;Sit to/from stand;+2 for safety/equipment   Toilet Transfer: Moderate assistance;Minimal assistance;+2 for physical assistance;+2 for safety/equipment;Ambulation;RW(simulated to recliner) Toilet Transfer Details (indicate cue type and reason): Pt requiring Mod A +2 to power up into standing. Then requiring Min A for balance in standing to amulate to recliner         Functional mobility during ADLs: Minimal assistance;+2 for physical assistance;+2 for safety/equipment;Rolling walker;Cueing for safety;Cueing for sequencing(Max cues for sequencing and RW management) General ADL Comments: Pt with decreased strength, balance, and cognition. Benefits from calm environement.      Vision         Perception     Praxis      Pertinent Vitals/Pain Pain Assessment: Faces Faces Pain Scale: Hurts a little bit Pain Location: BLEs Pain Descriptors / Indicators: Sharp;Shooting;Discomfort Pain Intervention(s): Limited activity within patient's tolerance;Monitored during session;Repositioned     Hand Dominance     Extremity/Trunk Assessment Upper Extremity Assessment Upper Extremity Assessment: Generalized weakness   Lower Extremity Assessment Lower Extremity Assessment: Defer to PT evaluation   Cervical / Trunk Assessment Cervical / Trunk  Assessment: Other exceptions Cervical / Trunk Exceptions: Increased body habitus   Communication  Communication Communication: No difficulties   Cognition Arousal/Alertness: Awake/alert Behavior During Therapy: Flat affect Overall Cognitive Status: No family/caregiver present to determine baseline cognitive functioning                                 General Comments: Baseline schizophrenia diagnosis. Very slow processing and allow increase time for responce. When asked why she was at the hospital, pt stated "I had no place else to go."   General Comments  NT present during session    Exercises     Shoulder Instructions      Home Living Family/patient expects to be discharged to:: Skilled nursing facility Living Arrangements: Alone   Type of Home: Apartment Home Access: Stairs to enter Entrance Stairs-Number of Steps: 2   Home Layout: One level     Bathroom Shower/Tub: Tub/shower unit         Home Equipment: [place] - single point;Walker - 2 wheels          Prior Functioning/Environment Level of Independence: Needs assistance  Gait / Transfers Assistance Needed: Pt reporting she used a RW for mobility but states "I dont like using it." ADL's / Homemaking Assistance Needed: Reports she required assistance for ADLs   Comments: Pt is a questionable historian        OT Problem List: Decreased strength;Decreased range of motion;Decreased activity tolerance;Impaired balance (sitting and/or standing);Decreased cognition;Decreased safety awareness;Decreased knowledge of use of DME or AE;Decreased knowledge of precautions      OT Treatment/Interventions: Self-care/ADL training;Therapeutic exercise;Energy conservation;DME and/or AE instruction;Therapeutic activities;Patient/family education    OT Goals(Current goals can be found in the care plan section) Acute Rehab OT Goals Patient Stated Goal: "Get out of this bed" OT Goal Formulation: With patient Time For Goal Achievement: 12/22/18 Potential to Achieve Goals: Good  OT Frequency: Min 2X/week    Barriers to D/C:            Co-evaluation              AM-PAC OT "6 Clicks" Daily Activity     Outcome Measure Help from another person eating meals?: None Help from another person taking care of personal grooming?: None Help from another person toileting, which includes using toliet, bedpan, or urinal?: A Lot Help from another person bathing (including washing, rinsing, drying)?: A Lot Help from another person to put on and taking off regular upper body clothing?: A Little Help from another person to put on and taking off regular lower body clothing?: A Lot 6 Click Score: 17   End of Session Equipment Utilized During Treatment: Gait belt;Rolling walker Nurse Communication: Mobility status  Activity Tolerance: Patient tolerated treatment well Patient left: in chair;with call bell/phone within reach  OT Visit Diagnosis: Unsteadiness on feet (R26.81);Other abnormalities of gait and mobility (R26.89);Muscle weakness (generalized) (M62.81);Other symptoms and signs involving cognitive function                Time: 5284-1324 OT Time Calculation (min): 22 min Charges:  OT General Charges $OT Visit: 1 Visit OT Evaluation $OT Eval Moderate Complexity: [place], OTR/L Acute Rehab Pager: [phone] Office: [place] 12/08/2018, 4:35 PM

## 2018-12-08 NOTE — ED Notes (Signed)
New PureWick applied.

## 2018-12-08 NOTE — Progress Notes (Signed)
CSW aware that [facility] as well as [facility] have both declined to take pt at this time. Per weekend CSW notes, pt being considered by [facility] and [facility]    Deanna Aguilar Jilene Spohr, MSW, [place] Clinical Social Worker [phone]

## 2018-12-08 NOTE — ED Notes (Signed)
The pt reports that she feels wet  She was cleaned up nothing soiled was seen  Fresh pads urine emptied from pure-wick drainage cannister

## 2018-12-08 NOTE — ED Notes (Signed)
Pt c/o back pain  meds given

## 2018-12-08 NOTE — ED Notes (Addendum)
Pt noted w/colostomy bag - empty - states continues w/bowel movements. Pt assisted to more comfortable position - voices frustration d/t unable to do for herself. Pt noted w/bil feet swelling - chronic - elevated.

## 2018-12-08 NOTE — ED Notes (Signed)
Only ate few bites of dinner.

## 2018-12-08 NOTE — ED Provider Notes (Signed)
Pt sitting up eating breakfast.  Pt denies any complaints.  Pt is holding for SNF admission.     Fransico Meadow, [place] 12/08/18 0289    Julianne Rice, MD 12/09/18 [phone]

## 2018-12-08 NOTE — Progress Notes (Addendum)
11:15AM: CSW spoke with Freda Munro at [facility]. CSW noted confusion regarding LOG but noted it had been approved for SNF. CSW noted per Freda Munro she would not be able to accept patient without staffing with administration on Monday. CSW will continue to follow.  Patient was accepted by [facility] and [facility]. CSW left voicemail with [facility] admissions coordinator with [facility]. CSW unable to reach staff at [facility]. CSW will continue to follow up.  Lamonte Richer, LCSW, [place] Worker II [phone]

## 2018-12-08 NOTE — ED Notes (Addendum)
Pt asleep with door closed

## 2018-12-08 NOTE — ED Notes (Signed)
BFAST TRAY ORDERED 

## 2018-12-08 NOTE — ED Notes (Signed)
Asking for pain med 

## 2018-12-08 NOTE — ED Notes (Signed)
Sitting in recliner from PT assisting pt. Pt requesting to lie down on bed. Encouraged pt to sit up until after dinner. Voiced understanding. Call bell w/in reach. Offered to turn on tv for pt - declined.

## 2018-12-08 NOTE — ED Notes (Signed)
Pt's SWGlade Aguilar [phone] - visited briefly w/pt - advised to please call her for any pt needs. Pt's sister, Deanna Aguilar, [phone].

## 2018-12-08 NOTE — ED Notes (Signed)
Pt repositioned in the bed, was able to help pull self up in bed.

## 2018-12-08 NOTE — ED Notes (Signed)
Pt noted to be calling staff to room continuously - asking for multiple items one at a time even after much encouragement from staff to request all needs at one time. After pt called RN to room to ask for lip balm and RN asking pt if anything else needed and pt voiced no - pt pressed call bell approx 2 minutes later and advised "I peed". Pt cleaned by staff.

## 2018-12-09 MED ORDER — HYDROCHLOROTHIAZIDE 25 MG PO TABS: 25.0000 mg | ORAL_TABLET | Freq: Every day | ORAL | 0 refills | Status: AC

## 2018-12-09 MED ORDER — ACETAMINOPHEN 325 MG PO TABS: 650.0000 mg | ORAL_TABLET | Freq: Four times a day (QID) | ORAL | 0 refills | Status: AC | PRN

## 2018-12-09 MED ORDER — METOPROLOL TARTRATE 50 MG PO TABS: 50.0000 mg | ORAL_TABLET | Freq: Two times a day (BID) | ORAL | 0 refills | Status: AC

## 2018-12-09 NOTE — Consult Note (Signed)
[facility] Nurse ostomy consult note Stoma type/location: Colostomy  Seen 1 month ago in ED with more significant peristomal breakdown.  Patient states she changes daily.  Sometimes "just because" Has flush stoma.  Encouraged that wound looks better (has re-epithelialized with thin friable epithelium) but that less frequent changes may promote further healing.  Will protect nonhealing areas with stoma powder and no sting skin prep.  Patient does not want a convex pouch today.  We will apply a 1piece flat with barrier ring.  Stomal assessment/size: Flush 2 :" stoma, nearly oval Peristomal assessment: nonintact lesions  9 o'clock:  1 cm x 0.5 cm x 0.1 cm 6 o'clock 0.5 cm x 3 cm x0.1 cm with surrounding new peristomal, fragile skin.  Treatment options for stomal/peristomal skin: Barrier ring for convexity.  Applied stoma powder and skin prep x 3 to protect nonintact and fragile peritstomal skin.  Seal obtained .  Supplies left at bedside.  Output Soft brown stool Ostomy pouching: 1pc.flat (patient preference- barrier rings/skin prep (no sting) and stoma powder for crusting.   Education provided: Educated regarding freuqent pouch removal impeding peristomal skin healing.  Patient with blank expression and no response.  Enrolled patient in [place] program: Yes previously.  Will not follow at this time.  Please re-consult if needed. Patient will be transferring to a new facility, she reports.  Domenic Moras MSN, RN, FNP-BC CWON Wound, Ostomy, Continence Nurse Pager [phone]

## 2018-12-09 NOTE — ED Notes (Signed)
Report called to [facility] at [facility]

## 2018-12-09 NOTE — Progress Notes (Addendum)
11:42am-CSW spoke with Gerald Stabs from [facility] and as informed that they are able to accept pt today. CSW has set transport up for 1:30 pm as well as faxed over all needed documents at this time RN to call report to [phone]. Pt doesn't have a set room number yet.   There are no further CSW needs. CSW signing off at this time.   8:43am-CSW spoke with pt at bedside to give bed offers. Pt was informed that from offers it appeared that the only two facilities offer are [facility] and [facility]. CSW advised pt that CSW hasn't heard back from [facility] at this time and had spoken with [facility] and was informed that they wanted to come assess pt. Pt expressed that she was willing to go to either facility but wanted her social worker Kenney Houseman and sister updated. CSW has updated social worker Kenney Houseman and she will update sister. Tonya expressed that she would be here to meet with pt and [facility].  7:43am-CSW spoke with Gerald Stabs from [facility] and was informed that they can possible offer on pt, however he would be here between 9:30-10am to visit with pt for further needs.   7:38am-CSW received call from [facility] with [facility] and was informed that he would look into accepting pt and then call CSW back. CSW awaits call at this time.   CSW continues to follow pt for further placement needs. CSW to follow up with Freda Munro from [facility] as well as Gerald Stabs with [facility] to if either are able to take pt.    Virgie Dad Teighlor Korson, MSW, [facility] Clinical Social Worker [phone]

## 2018-12-09 NOTE — ED Notes (Signed)
Pt called out over 8 times in 20 minutes.  Each time RN or Tec assisted pt.  RN explained that there were other pts to tend to and asked if there was anything else she would need in the next 20 minutes.  Pt continues to call out.

## 2018-12-09 NOTE — ED Provider Notes (Signed)
63 year old female here waiting outpatient placement.  I was called over to patient's bedside as she was noted to have a tachycardic rate of 145.  EKG shows sinus tach, patient unaware that her heart rate is elevated as she has no symptoms.  Nursing staff noted that similar episode happened yesterday and resolved promptly.  Patient will be given her home medications including metoprolol, she has no signs of atrial fibrillation.  Upon repeat evaluation patient with heart rate in the 90s regular rate and rhythm with no signs of distress.     Okey Regal, PA-C 12/09/18 [place], MD 12/10/18 [phone]

## 2018-12-09 NOTE — ED Notes (Signed)
Carb Modified Diet was ordered for Lunch. 

## 2018-12-09 NOTE — Discharge Planning (Signed)
Clinical Social Work is seeking posSNFt-discharge placement for this patient at the following level of care: SNF

## 2018-12-19 ENCOUNTER — Other Ambulatory Visit: Payer: Self-pay

## 2018-12-19 ENCOUNTER — Emergency Department (HOSPITAL_COMMUNITY)
Admission: EM | Admit: 2018-12-19 | Discharge: 2018-12-19 | Disposition: A | Discharge status: Home / Self Care | Payer: Medicaid Other | Attending: Emergency Medicine | Admitting: Emergency Medicine

## 2018-12-19 ENCOUNTER — Encounter (HOSPITAL_COMMUNITY): Payer: Self-pay

## 2018-12-19 DIAGNOSIS — I10 Essential (primary) hypertension: Secondary | ICD-10-CM | POA: Diagnosis not present

## 2018-12-19 DIAGNOSIS — Z79899 Other long term (current) drug therapy: Secondary | ICD-10-CM | POA: Insufficient documentation

## 2018-12-19 DIAGNOSIS — K625 Hemorrhage of anus and rectum: Secondary | ICD-10-CM | POA: Diagnosis present

## 2018-12-19 DIAGNOSIS — N3001 Acute cystitis with hematuria: Secondary | ICD-10-CM | POA: Insufficient documentation

## 2018-12-19 DIAGNOSIS — Z87891 Personal history of nicotine dependence: Secondary | ICD-10-CM | POA: Insufficient documentation

## 2018-12-19 LAB — I-STAT CHEM 8, ED
BUN: 28 mg/dL — ABNORMAL HIGH (ref 8–23)
Calcium, Ion: 1.11 mmol/L — ABNORMAL LOW (ref 1.15–1.40)
Chloride: 97 mmol/L — ABNORMAL LOW (ref 98–111)
Creatinine, Ser: 1.3 mg/dL — ABNORMAL HIGH (ref 0.44–1.00)
GLUCOSE: 105 mg/dL — AB (ref 70–99)
HCT: 38 % (ref 36.0–46.0)
Hemoglobin: 12.9 g/dL (ref 12.0–15.0)
Potassium: 4.1 mmol/L (ref 3.5–5.1)
Sodium: 135 mmol/L (ref 135–145)
TCO2: 31 mmol/L (ref 22–32)

## 2018-12-19 LAB — URINALYSIS, ROUTINE W REFLEX MICROSCOPIC
Bilirubin Urine: NEGATIVE
Glucose, UA: NEGATIVE mg/dL
Hgb urine dipstick: NEGATIVE
Ketones, ur: 5 mg/dL — AB
Nitrite: NEGATIVE
PH: 5 (ref 5.0–8.0)
Protein, ur: NEGATIVE mg/dL
Specific Gravity, Urine: 1.026 (ref 1.005–1.030)
WBC, UA: >50 WBC/hpf — ABNORMAL HIGH (ref 0–5)

## 2018-12-19 LAB — CBC WITH DIFFERENTIAL/PLATELET
Abs Immature Granulocytes: 0.02 10*3/uL (ref 0.00–0.07)
Basophils Absolute: 0 10*3/uL (ref 0.0–0.1)
Basophils Relative: 0 %
EOS ABS: 0.1 10*3/uL (ref 0.0–0.5)
Eosinophils Relative: 1 %
HCT: 37.5 % (ref 36.0–46.0)
Hemoglobin: 11.2 g/dL — ABNORMAL LOW (ref 12.0–15.0)
Immature Granulocytes: 0 %
Lymphocytes Relative: 22 %
Lymphs Abs: 2.1 10*3/uL (ref 0.7–4.0)
MCH: 24.6 pg — ABNORMAL LOW (ref 26.0–34.0)
MCHC: 29.9 g/dL — AB (ref 30.0–36.0)
MCV: 82.2 fL (ref 80.0–100.0)
Monocytes Absolute: 1.2 10*3/uL — ABNORMAL HIGH (ref 0.1–1.0)
Monocytes Relative: 13 %
Neutro Abs: 6 10*3/uL (ref 1.7–7.7)
Neutrophils Relative %: 64 %
Platelets: 446 10*3/uL — ABNORMAL HIGH (ref 150–400)
RBC: 4.56 MIL/uL (ref 3.87–5.11)
RDW: 17.3 % — ABNORMAL HIGH (ref 11.5–15.5)
WBC: 9.4 10*3/uL (ref 4.0–10.5)
nRBC: 0 % (ref 0.0–0.2)

## 2018-12-19 LAB — COMPREHENSIVE METABOLIC PANEL
ALBUMIN: 3.3 g/dL — AB (ref 3.5–5.0)
ALT: 15 U/L (ref 0–44)
AST: 26 U/L (ref 15–41)
Alkaline Phosphatase: 45 U/L (ref 38–126)
Anion gap: 11 (ref 5–15)
BILIRUBIN TOTAL: 0.9 mg/dL (ref 0.3–1.2)
BUN: 22 mg/dL (ref 8–23)
CO2: 26 mmol/L (ref 22–32)
Calcium: 9.1 mg/dL (ref 8.9–10.3)
Chloride: 97 mmol/L — ABNORMAL LOW (ref 98–111)
Creatinine, Ser: 1.23 mg/dL — ABNORMAL HIGH (ref 0.44–1.00)
GFR calc Af Amer: 55 mL/min — ABNORMAL LOW (ref >60)
GFR calc non Af Amer: 47 mL/min — ABNORMAL LOW (ref >60)
Glucose, Bld: 107 mg/dL — ABNORMAL HIGH (ref 70–99)
Potassium: 2.9 mmol/L — ABNORMAL LOW (ref 3.5–5.1)
Sodium: 134 mmol/L — ABNORMAL LOW (ref 135–145)
Total Protein: 8.2 g/dL — ABNORMAL HIGH (ref 6.5–8.1)

## 2018-12-19 LAB — TYPE AND SCREEN
ABO/RH(D): B POS
Antibody Screen: NEGATIVE

## 2018-12-19 LAB — POC OCCULT BLOOD, ED: Fecal Occult Bld: NEGATIVE

## 2018-12-19 MED ORDER — SODIUM CHLORIDE 0.9 % IV BOLUS: 500.0000 mL | Freq: Once | INTRAVENOUS | Status: DC

## 2018-12-19 MED ORDER — SODIUM CHLORIDE 0.9 % IV BOLUS
1000.0000 mL | Freq: Once | INTRAVENOUS | Status: AC
  Administered 2018-12-19: 1000 mL via INTRAVENOUS

## 2018-12-19 MED ORDER — METOPROLOL TARTRATE 50 MG PO TABS
50.0000 mg | ORAL_TABLET | Freq: Once | ORAL | Status: AC
  Administered 2018-12-19: 50 mg via ORAL

## 2018-12-19 MED ORDER — POTASSIUM CHLORIDE CRYS ER 20 MEQ PO TBCR
40.0000 meq | EXTENDED_RELEASE_TABLET | Freq: Once | ORAL | Status: AC
  Administered 2018-12-19: 40 meq via ORAL
  Filled 2018-12-19: qty 2

## 2018-12-19 MED ORDER — POTASSIUM CHLORIDE ER 10 MEQ PO TBCR: 10.0000 meq | EXTENDED_RELEASE_TABLET | Freq: Every day | ORAL | 0 refills | Status: AC

## 2018-12-19 MED ORDER — CIPROFLOXACIN HCL 500 MG PO TABS: 500.0000 mg | ORAL_TABLET | Freq: Two times a day (BID) | ORAL | 0 refills | Status: AC

## 2018-12-19 MED ORDER — METOPROLOL TARTRATE 50 MG PO TABS
ORAL_TABLET | ORAL | Status: AC
  Filled 2018-12-19: qty 1

## 2018-12-19 MED ORDER — CIPROFLOXACIN HCL 250 MG PO TABS
500.0000 mg | ORAL_TABLET | Freq: Once | ORAL | Status: AC
  Administered 2018-12-19: 500 mg via ORAL
  Filled 2018-12-19: qty 2

## 2018-12-19 MED ORDER — METOPROLOL TARTRATE 5 MG/5ML IV SOLN
5.0000 mg | Freq: Once | INTRAVENOUS | Status: DC
  Filled 2018-12-19: qty 5

## 2018-12-19 NOTE — Discharge Instructions (Addendum)
Follow-up with your doctor next week for recheck of your potassium and your urinary tract infection.

## 2018-12-19 NOTE — ED Triage Notes (Addendum)
Pt brought in by EMS from [facility] in [place]. [place]. Reported by staff that pat passed approx 1/2 cup of dark red blood  From rectum. Pt does have a colostomy bag due to diverticulitis. Stool in colostomy is black

## 2018-12-19 NOTE — ED Provider Notes (Signed)
[facility] EMERGENCY DEPARTMENT Provider Note   CSN: [id number] Arrival date & time: 12/19/18  1449     History   Chief Complaint No chief complaint on file.   HPI Deanna Aguilar is a 63 y.o. female.  Patient supposedly had rectal bleeding today.  She has a colostomy and no blood is seen in that  The history is provided by a relative and the nursing home. No language interpreter was used.  Illness  Location:  Rectal bleeding Quality:  Light red Severity:  Moderate Onset quality:  Sudden Timing:  Constant Progression:  Resolved Chronicity:  New Context:  Found bleeding in bed Relieved by:  Unknown Worsened by:  Nothing Ineffective treatments:  No treatment done Associated symptoms: no abdominal pain   Risk factors:  Colostomy   Past Medical History:  Diagnosis Date  . Arthritis   . Colostomy in place [facility])   . Depression   . Diverticulitis   . Hypertension   . Schizophrenia [facility])     Patient Active Problem List   Diagnosis Date Noted  . Vaginal dryness 07/16/2018  . Rectal bleeding 05/29/2017  . Insomnia 02/21/2016  . Preventative health care 01/11/2016  . PAF (paroxysmal atrial fibrillation) ([place]) 08/18/2015  . Essential hypertension 08/18/2015  . Depression 06/13/2015  . Colostomy in place [facility]) 06/13/2015  . Morbid obesity ([place]) 06/10/2015  . Arthritis- on disability 06/10/2015  . Diverticulitis of colon with perforation s/p colectomy/ostomy 06/09/2015 06/07/2015    Past Surgical History:  Procedure Laterality Date  . COLON RESECTION N/A 06/09/2015   Procedure: SIGMOID COLECTOMY, COLOSTOMY, TAKE DOWN SPLENIC FLECTURE;  Surgeon: Fanny Skates, MD;  Location: [facility] ORS;  Service: General;  Laterality: N/A;  . TUBAL LIGATION       OB History   No obstetric history on file.      Home Medications    Prior to Admission medications   Medication Sig Start Date End Date Taking? Authorizing Provider  acetaminophen (TYLENOL) 325 MG tablet Take 2  tablets (650 mg total) by mouth every 6 (six) hours as needed. 12/09/18   Hedges, Dellis Filbert, PA-C  ciprofloxacin (CIPRO) 500 MG tablet Take 1 tablet (500 mg total) by mouth 2 (two) times daily. One po bid x 7 days 12/19/18   Milton Ferguson, MD  hydrochlorothiazide (HYDRODIURIL) 25 MG tablet Take 1 tablet (25 mg total) by mouth daily. 12/09/18   Hedges, Dellis Filbert, PA-C  metoprolol tartrate (LOPRESSOR) 50 MG tablet Take 1 tablet (50 mg total) by mouth 2 (two) times daily. 12/09/18   Hedges, Dellis Filbert, PA-C  naproxen sodium (ALEVE) 220 MG tablet Take 220 mg by mouth daily as needed (pain).    [provider]  Ostomy Supplies (ADHESIVE REMOVER WIPES) MISC 1 Units by Does not apply route daily. 04/17/18   Aldine Contes, MD  Ostomy Supplies (OSTOMY DRAINABLE POUCH/FLANGE) MISC 1 Units by Does not apply route daily. 04/17/18   Aldine Contes, MD  Ostomy Supplies (SECURI-T OSTOMY DEODORANT) LIQD 1 Units by Does not apply route daily. 04/17/18   Aldine Contes, MD  Ostomy Supplies (SKIN PREP WIPES) MISC 1 Units by Does not apply route daily. 04/17/18   Aldine Contes, MD  potassium chloride (K-DUR) 10 MEQ tablet Take 1 tablet (10 mEq total) by mouth daily. 12/19/18   Milton Ferguson, MD    Family History Family History  Problem Relation Age of Onset  . Heart disease Mother   . Heart attack Mother   . Hypertension Mother   . Other  Maternal Grandmother        ovarian issues    Social History Social History   Tobacco Use  . Smoking status: Former Research scientist (life sciences)  . Smokeless tobacco: Never Used  . Tobacco comment: QUIT SMOKING ABOUT 18 YEARS AGO(PER PATIENT)  Substance Use Topics  . Alcohol use: No    Alcohol/week: 0.0 standard drinks  . Drug use: No     Allergies   Patient has no known allergies.   Review of Systems Review of Systems  Unable to perform ROS: Mental status change  Gastrointestinal: Negative for abdominal pain.     Physical Exam Updated Vital Signs BP 113/60   Pulse 78    Temp 98.6 F (37 C) (Oral)   Resp (!) 23   Ht 5\' 10"  (1.778 m)   Wt 104.3 kg   SpO2 95%   BMI 33.00 kg/m   Physical Exam Vitals signs and nursing note reviewed.  Constitutional:      Appearance: She is well-developed.  HENT:     Head: Normocephalic.  Eyes:     General: No scleral icterus.    Conjunctiva/sclera: Conjunctivae normal.  Neck:     Musculoskeletal: Neck supple.     Thyroid: No thyromegaly.  Cardiovascular:     Rate and Rhythm: Normal rate and regular rhythm.     Heart sounds: No murmur. No friction rub. No gallop.   Pulmonary:     Breath sounds: No stridor. No wheezing or rales.  Chest:     Chest wall: No tenderness.  Abdominal:     General: There is no distension.     Tenderness: There is no abdominal tenderness. There is no rebound.  Genitourinary:    Comments: Rectal exam heme-negative Musculoskeletal: Normal range of motion.  Lymphadenopathy:     Cervical: No cervical adenopathy.  Skin:    Findings: No erythema or rash.  Neurological:     Mental Status: She is alert and oriented to person, place, and time.     Motor: No abnormal muscle tone.     Coordination: Coordination normal.  Psychiatric:        Behavior: Behavior normal.      ED Treatments / Results  Labs (all labs ordered are listed, but only abnormal results are displayed) Labs Reviewed  CBC WITH DIFFERENTIAL/PLATELET - Abnormal; Notable for the following components:      Result Value   Hemoglobin 11.2 (*)    MCH 24.6 (*)    MCHC 29.9 (*)    RDW 17.3 (*)    Platelets 446 (*)    Monocytes Absolute 1.2 (*)    All other components within normal limits  COMPREHENSIVE METABOLIC PANEL - Abnormal; Notable for the following components:   Sodium 134 (*)    Potassium 2.9 (*)    Chloride 97 (*)    Glucose, Bld 107 (*)    Creatinine, Ser 1.23 (*)    Total Protein 8.2 (*)    Albumin 3.3 (*)    GFR calc non Af Amer 47 (*)    GFR calc Af Amer 55 (*)    All other components within normal  limits  URINALYSIS, ROUTINE W REFLEX MICROSCOPIC - Abnormal; Notable for the following components:   APPearance HAZY (*)    Ketones, ur 5 (*)    Leukocytes, UA MODERATE (*)    WBC, UA >50 (*)    Bacteria, UA MANY (*)    All other components within normal limits  I-STAT CHEM 8, ED -  Abnormal; Notable for the following components:   Chloride 97 (*)    BUN 28 (*)    Creatinine, Ser 1.30 (*)    Glucose, Bld 105 (*)    Calcium, Ion 1.11 (*)    All other components within normal limits  URINE CULTURE  POC OCCULT BLOOD, ED  TYPE AND SCREEN    EKG EKG Interpretation  Date/Time:  Thursday December 19 2018 16:11:56 EST Ventricular Rate:  80 PR Interval:    QRS Duration: 117 QT Interval:  371 QTC Calculation: 428 R Axis:   9 Text Interpretation:  Sinus rhythm Ventricular premature complex Probable left atrial enlargement Nonspecific intraventricular conduction delay Borderline T abnormalities, anterior leads Confirmed by Milton Ferguson [id number]) on 12/19/2018 4:59:56 PM   Radiology No results found.  Procedures Procedures (including critical care time)  Medications Ordered in ED Medications  metoprolol tartrate (LOPRESSOR) injection 5 mg (5 mg Intravenous Not Given 12/19/18 1606)  potassium chloride SA (K-DUR,KLOR-CON) CR tablet 40 mEq (has no administration in time range)  ciprofloxacin (CIPRO) tablet 500 mg (has no administration in time range)  sodium chloride 0.9 % bolus 1,000 mL (1,000 mLs Intravenous New Bag/Given 12/19/18 1552)     Initial Impression / Assessment and Plan / ED Course  I have reviewed the triage vital signs and the nursing notes.  Pertinent labs & imaging results that were available during my care of the patient were reviewed by me and considered in my medical decision making (see chart for details).     Patient with urinary tract infection.  Suspect the bleeding may be related to that.  Her hemoglobin has been stable.  She also has hypokalemia.  We will  place her on Cipro for the urinary tract infection and potassium for hypokalemia and she will follow-up with PCP  Final Clinical Impressions(s) / ED Diagnoses   Final diagnoses:  Acute cystitis with hematuria    ED Discharge Orders         Ordered    ciprofloxacin (CIPRO) 500 MG tablet  2 times daily     12/19/18 1729    potassium chloride (K-DUR) 10 MEQ tablet  Daily     12/19/18 1729           Milton Ferguson, MD 12/19/18 1735

## 2018-12-19 NOTE — ED Notes (Signed)
Pt had been loaded into the ems truck for transport return to facility. Ems recorded heartrate fluctuating between 130 and 170 and wheeled pt back in on stretcher.  Dr Roderic Palau re-evaluated, and pt hr now 70's to 35's.  Pt was given her nighttime dose of Metoprolol 50 mg at this time per v.o. dr zammit.  Pt loaded back into ems truck for return to facility.  Heather C. Called and gave report to facility regarding same.

## 2018-12-19 NOTE — ED Notes (Signed)
[facility] at bedside for transport.

## 2018-12-21 LAB — URINE CULTURE: Culture: >=100000 — AB

## 2018-12-22 ENCOUNTER — Telehealth: Payer: Self-pay | Admitting: Emergency Medicine

## 2018-12-22 NOTE — Telephone Encounter (Signed)
Post ED Visit - Positive Culture Follow-up  Culture report reviewed by antimicrobial stewardship pharmacist:  []  Elenor Quinones, Pharm.D. []  Heide Guile, Pharm.D., BCPS AQ-ID []  Parks Neptune, Pharm.D., BCPS []  Alycia Rossetti, Pharm.D., BCPS []  [place], [place].D., BCPS, AAHIVP []  Legrand Como, Pharm.D., BCPS, AAHIVP []  Salome Arnt, PharmD, BCPS []  Johnnette Gourd, PharmD, BCPS []  Hughes Better, PharmD, BCPS [x]  Leeroy Cha, PharmD  Positive urine culture Treated with Ciprofloxacin, organism sensitive to the same and no further patient follow-up is required at this time.  Georgina Peer RN 12/22/2018, 1:15 PM

## 2019-08-21 ENCOUNTER — Telehealth: Payer: Self-pay | Admitting: Internal Medicine

## 2019-08-21 NOTE — Telephone Encounter (Signed)
[facility] calling to f/u about the patient DME Ostomy Supplies Orders that were sen ton 08/13/2019.  Please call back and speak with their Representative @ [phone] Apolonio Schneiders) . Office visit notes are needed as well as the signed form.

## 2019-08-22 NOTE — Telephone Encounter (Signed)
Patient last seen 07/16/2018. Returned call to Rachal at [facility]. No answer and no ability to leave VM. Hubbard Hartshorn, RN, BSN

## 2019-09-24 ENCOUNTER — Other Ambulatory Visit: Payer: Self-pay

## 2019-09-24 ENCOUNTER — Ambulatory Visit (INDEPENDENT_AMBULATORY_CARE_PROVIDER_SITE_OTHER): Payer: Medicaid Other | Admitting: Internal Medicine

## 2019-09-24 ENCOUNTER — Telehealth: Payer: Self-pay | Admitting: Internal Medicine

## 2019-09-24 DIAGNOSIS — Z8719 Personal history of other diseases of the digestive system: Secondary | ICD-10-CM

## 2019-09-24 DIAGNOSIS — Z9049 Acquired absence of other specified parts of digestive tract: Secondary | ICD-10-CM | POA: Diagnosis not present

## 2019-09-24 DIAGNOSIS — Z933 Colostomy status: Secondary | ICD-10-CM | POA: Diagnosis not present

## 2019-09-24 DIAGNOSIS — Z09 Encounter for follow-up examination after completed treatment for conditions other than malignant neoplasm: Secondary | ICD-10-CM | POA: Diagnosis not present

## 2019-09-24 MED ORDER — OSTOMY DRAINABLE POUCH/FLANGE MISC: 1.0000 [IU] | Freq: Every day | 0 refills | Status: AC

## 2019-09-24 MED ORDER — SKIN PREP WIPES MISC: 1.0000 [IU] | Freq: Every day | 0 refills | Status: AC

## 2019-09-24 MED ORDER — ADHESIVE REMOVER WIPES MISC: 1.0000 [IU] | Freq: Every day | 0 refills | Status: AC

## 2019-09-24 MED ORDER — SECURI-T OSTOMY DEODORANT LIQD: 1.0000 [IU] | Freq: Every day | 3 refills | Status: AC

## 2019-09-24 NOTE — Progress Notes (Signed)
Internal Medicine Clinic Attending  Case discussed with Dr. Helberg  soon after the resident saw the patient.  We reviewed the resident's history and exam and pertinent patient test results.  I agree with the assessment, diagnosis, and plan of care documented in the resident's note.  

## 2019-09-24 NOTE — Telephone Encounter (Signed)
Patient called in requesting her ostomy supplies. Explained that she has not been seen since Aug 2019 and would need to schedule appt to address this. Patient is currently in a rehab facility. PCP has no availability at this time. [facility] telehealth appt scheduled for today. Hubbard Hartshorn, BSN, RN-BC

## 2019-09-24 NOTE — Telephone Encounter (Signed)
Call placed to Marnee Spring at [facility]. VM left requesting she refax form for ostomy supplies. Hubbard Hartshorn, BSN, RN-BC

## 2019-09-24 NOTE — Progress Notes (Signed)
   CC: Colostomy  This is a telephone encounter between Deanna Aguilar and Deanna Aguilar on 09/24/2019 for colostomy. The visit was conducted with the patient located at [place] at [facility]. The patient's identity was confirmed using their DOB and current address. The patient has consented to being evaluated through a telephone encounter and understands the associated risks (an examination cannot be done and the patient may need to come in for an appointment) / benefits (allows the patient to remain at home, decreasing exposure to coronavirus). I personally spent 6 minutes on medical discussion.   HPI:  Deanna Aguilar is a 63 y.o. with PMH as below.   Please see A&P for assessment of the patient's acute and chronic medical conditions.   Past Medical History:  Diagnosis Date  . Arthritis   . Colostomy in place [facility])   . Depression   . Diverticulitis   . Hypertension   . Schizophrenia ([place])    Review of Systems:  Performed and all others negative.  Assessment & Plan:   See Encounters Tab for problem based charting.  Patient discussed with Dr. Angelia Mould

## 2019-09-24 NOTE — Telephone Encounter (Signed)
This has been addressed in phone encounter started 08/21/2019. Hubbard Hartshorn, BSN, RN-BC

## 2019-09-24 NOTE — Telephone Encounter (Signed)
Pt called requesting help with getting her Colostomy Supplies through [facility]).  Pt states she was asked to provide the phone number of [phone]. Pt had OV with Dr. Tarri Abernethy and their company needs the Dr.'s information as well.

## 2019-09-24 NOTE — Telephone Encounter (Signed)
Completed and signed CMN for ostomy supplies faxed to Marnee Spring with [facility] at [phone]. Hubbard Hartshorn, BSN, RN-BC

## 2019-09-24 NOTE — Assessment & Plan Note (Signed)
Patient with a history of diverticulitis in a partial colectomy now with colostomy in place since 2015. She has been managing her colostomy by herself. She typically get supplies every month from [place]. His paths month she was unable to get her supplies as she is not been seen in the clinic in approximately one year. She has had no issues with her ostomy site. No drainage, bleeding, or breakdown. She is currently out of supplies.  A/P: - Continue ostomy supplies including adhesive remover wipes, ostomy trainable pouch, securi-T ostomy deodorant, and skin prep wipes.

## 2019-10-01 ENCOUNTER — Ambulatory Visit: Payer: Medicaid Other

## 2019-10-03 NOTE — Telephone Encounter (Signed)
Patient called in stating there is a problem with receiving her ostomy supplies. CMN and chart notes faxed on 09/24/2019. Call placed to Marnee Spring at [facility]. No answer. Left message on VM requesting return call. Hubbard Hartshorn, RN, BSN

## 2019-10-03 NOTE — Telephone Encounter (Signed)
Apolonio Schneiders returned call. States she did not receive CMN and chart notes faxed on 09/24/2019. Requesting to be re-faxed to different number [phone]. This has been done with confirmation receipt. Apolonio Schneiders will have customer service contact patient to assure her she will receive her ostomy supplies. Hubbard Hartshorn, BSN, RN-BC

## 2019-11-13 ENCOUNTER — Telehealth: Payer: Self-pay | Admitting: Obstetrics & Gynecology

## 2019-11-13 NOTE — Telephone Encounter (Signed)

## 2019-11-14 ENCOUNTER — Other Ambulatory Visit: Payer: Self-pay

## 2019-11-14 ENCOUNTER — Encounter: Payer: Self-pay | Admitting: Obstetrics & Gynecology

## 2019-11-14 ENCOUNTER — Ambulatory Visit (INDEPENDENT_AMBULATORY_CARE_PROVIDER_SITE_OTHER): Payer: Medicaid Other | Admitting: Obstetrics & Gynecology

## 2019-11-14 VITALS — BP 135/76 | HR 67

## 2019-11-14 DIAGNOSIS — L0292 Furuncle, unspecified: Secondary | ICD-10-CM | POA: Diagnosis not present

## 2019-11-14 MED ORDER — SILVER SULFADIAZINE 1 % EX CREA: TOPICAL_CREAM | CUTANEOUS | 11 refills | Status: AC

## 2019-11-14 MED ORDER — SULFAMETHOXAZOLE-TRIMETHOPRIM 800-160 MG PO TABS: 1.0000 | ORAL_TABLET | Freq: Two times a day (BID) | ORAL | 1 refills | Status: AC

## 2019-11-14 NOTE — Progress Notes (Signed)
Chief Complaint  Patient presents with  . Follow-up    ER-skin abcess      63 y.o. No obstetric history on file. No LMP recorded. Patient is postmenopausal. The current method of family planning is .  Outpatient Encounter Medications as of 11/14/2019  Medication Sig  . acetaminophen (TYLENOL) 325 MG tablet Take 2 tablets (650 mg total) by mouth every 6 (six) hours as needed.  . ciprofloxacin (CIPRO) 500 MG tablet Take 1 tablet (500 mg total) by mouth 2 (two) times daily. One po bid x 7 days (Patient not taking: Reported on 11/24/2019)  . hydrochlorothiazide (HYDRODIURIL) 25 MG tablet Take 1 tablet (25 mg total) by mouth daily.  . metoprolol tartrate (LOPRESSOR) 50 MG tablet Take 1 tablet (50 mg total) by mouth 2 (two) times daily.  . naproxen sodium (ALEVE) 220 MG tablet Take 220 mg by mouth daily as needed (pain).  [place] Rack Supplies (ADHESIVE REMOVER WIPES) MISC 1 Units by Does not apply route daily.  . Ostomy Supplies (OSTOMY DRAINABLE POUCH/FLANGE) MISC 1 Units by Does not apply route daily.  . Ostomy Supplies (SECURI-T OSTOMY DEODORANT) LIQD 1 Units by Does not apply route daily.  Oneta Rack Supplies (SKIN PREP WIPES) MISC 1 Units by Does not apply route daily.  . potassium chloride (K-DUR) 10 MEQ tablet Take 1 tablet (10 mEq total) by mouth daily.  . silver sulfADIAZINE (SILVADENE) 1 % cream Use to area twice daily with dressing changes  . sulfamethoxazole-trimethoprim (BACTRIM DS) 800-160 MG tablet Take 1 tablet by mouth 2 (two) times daily.   No facility-administered encounter medications on file as of 11/14/2019.    Subjective Pt with several day history of infection of the mons No drainge Diabetic Has had previously Past Medical History:  Diagnosis Date  . Anxiety   . Arthritis   . Colostomy in place [facility])   . Depression   . Diverticulitis   . Hypertension   . Schizophrenia [facility])     Past Surgical History:  Procedure Laterality Date  . COLON RESECTION  N/A 06/09/2015   Procedure: SIGMOID COLECTOMY, COLOSTOMY, TAKE DOWN SPLENIC FLECTURE;  Surgeon: Fanny Skates, MD;  Location: [facility] ORS;  Service: General;  Laterality: N/A;  . FLEXIBLE SIGMOIDOSCOPY N/A 11/24/2019   Procedure: FLEXIBLE SIGMOIDOSCOPY;  Surgeon: Toledo, Benay Pike, MD;  Location: [facility] ENDOSCOPY;  Service: Gastroenterology;  Laterality: N/A;  NON-SEDATED FLEX SIGM PER OFFICE  . TUBAL LIGATION      OB History   No obstetric history on file.     No Known Allergies  Social History   Socioeconomic History  . Marital status: Legally Separated    Spouse name: Not on file  . Number of children: Not on file  . Years of education: Not on file  . Highest education level: Not on file  Occupational History  . Not on file  Tobacco Use  . Smoking status: Former Research scientist (life sciences)  . Smokeless tobacco: Never Used  . Tobacco comment: QUIT SMOKING ABOUT 18 YEARS AGO(PER PATIENT)  Substance and Sexual Activity  . Alcohol use: No    Alcohol/week: 0.0 standard drinks  . Drug use: No  . Sexual activity: Not on file  Other Topics Concern  . Not on file  Social History Narrative  . Not on file   Social Determinants of Health   Financial Resource Strain:   . Difficulty of Paying Living Expenses:   Food Insecurity:   . Worried About Charity fundraiser  in the Last Year:   . [place] in the Last Year:   Transportation Needs:   . Film/video editor (Medical):   Marland Kitchen Lack of Transportation (Non-Medical):   Physical Activity:   . Days of Exercise per Week:   . Minutes of Exercise per Session:   Stress:   . Feeling of Stress :   Social Connections:   . Frequency of Communication with Friends and Family:   . Frequency of Social Gatherings with Friends and Family:   . Attends Religious Services:   . Active Member of Clubs or Organizations:   . Attends Archivist Meetings:   Marland Kitchen Marital Status:     Family History  Problem Relation Age of Onset  . Heart disease Mother   .  Heart attack Mother   . Hypertension Mother   . Other Maternal Grandmother        ovarian issues    Medications:       Current Outpatient Medications:  .  acetaminophen (TYLENOL) 325 MG tablet, Take 2 tablets (650 mg total) by mouth every 6 (six) hours as needed., Disp: 30 tablet, Rfl: 0 .  ciprofloxacin (CIPRO) 500 MG tablet, Take 1 tablet (500 mg total) by mouth 2 (two) times daily. One po bid x 7 days (Patient not taking: Reported on 11/24/2019), Disp: 14 tablet, Rfl: 0 .  hydrochlorothiazide (HYDRODIURIL) 25 MG tablet, Take 1 tablet (25 mg total) by mouth daily., Disp: 30 tablet, Rfl: 0 .  metoprolol tartrate (LOPRESSOR) 50 MG tablet, Take 1 tablet (50 mg total) by mouth 2 (two) times daily., Disp: 60 tablet, Rfl: 0 .  naproxen sodium (ALEVE) 220 MG tablet, Take 220 mg by mouth daily as needed (pain)., Disp: , Rfl:  .  Ostomy Supplies (ADHESIVE REMOVER WIPES) MISC, 1 Units by Does not apply route daily., Disp: 50 each, Rfl: 0 .  Ostomy Supplies (OSTOMY DRAINABLE POUCH/FLANGE) MISC, 1 Units by Does not apply route daily., Disp: 20 each, Rfl: 0 .  Ostomy Supplies (SECURI-T OSTOMY DEODORANT) LIQD, 1 Units by Does not apply route daily., Disp: 240 mL, Rfl: 3 .  Ostomy Supplies (SKIN PREP WIPES) MISC, 1 Units by Does not apply route daily., Disp: 50 each, Rfl: 0 .  potassium chloride (K-DUR) 10 MEQ tablet, Take 1 tablet (10 mEq total) by mouth daily., Disp: 7 tablet, Rfl: 0 .  meloxicam (MOBIC) 15 MG tablet, Take 15 mg by mouth daily., Disp: , Rfl:  .  risperiDONE (RISPERDAL) 2 MG tablet, Take 2 mg by mouth 2 (two) times daily., Disp: , Rfl:  .  sertraline (ZOLOFT) 25 MG tablet, Take 25 mg by mouth 2 (two) times daily., Disp: , Rfl:  .  silver sulfADIAZINE (SILVADENE) 1 % cream, Use to area twice daily with dressing changes, Disp: 50 g, Rfl: 11 .  sulfamethoxazole-trimethoprim (BACTRIM DS) 800-160 MG tablet, Take 1 tablet by mouth 2 (two) times daily., Disp: 14 tablet, Rfl: 1 .  traMADol  (ULTRAM) 50 MG tablet, Take by mouth every 6 (six) hours as needed., Disp: , Rfl:   Objective Blood pressure 135/76, pulse 67.  Relatively small boil of the mons pubis  No extension  Pertinent ROS No burning with urination, frequency or urgency No nausea, vomiting or diarrhea Nor fever chills or other constitutional symptoms   Labs or studies none    Impression Diagnoses this Encounter::   ICD-10-CM   1. Boil, pubis, assume MRSA  L02.92     Established  relevant diagnosis(es):   Plan/Recommendations: Meds ordered this encounter  Medications  . silver sulfADIAZINE (SILVADENE) 1 % cream    Sig: Use to area twice daily with dressing changes    Dispense:  50 g    Refill:  11  . sulfamethoxazole-trimethoprim (BACTRIM DS) 800-160 MG tablet    Sig: Take 1 tablet by mouth 2 (two) times daily.    Dispense:  14 tablet    Refill:  1    Labs or Scans Ordered: No orders of the defined types were placed in this encounter.   Management:: Bactrim DS + silvadene  Follow up Return if symptoms worsen or fail to improve.        All questions were answered.

## 2019-11-20 ENCOUNTER — Other Ambulatory Visit: Payer: Self-pay

## 2019-11-20 ENCOUNTER — Other Ambulatory Visit
Admission: RE | Admit: 2019-11-20 | Discharge: 2019-11-20 | Disposition: A | Discharge status: Home / Self Care | Payer: Medicaid Other | Source: Ambulatory Visit | Attending: Internal Medicine | Admitting: Internal Medicine

## 2019-11-20 DIAGNOSIS — Z01812 Encounter for preprocedural laboratory examination: Secondary | ICD-10-CM | POA: Diagnosis not present

## 2019-11-20 DIAGNOSIS — Z20828 Contact with and (suspected) exposure to other viral communicable diseases: Secondary | ICD-10-CM | POA: Insufficient documentation

## 2019-11-20 LAB — SARS CORONAVIRUS 2 (TAT 6-24 HRS): SARS Coronavirus 2: NEGATIVE

## 2019-11-21 ENCOUNTER — Encounter: Payer: Self-pay | Admitting: Internal Medicine

## 2019-11-24 ENCOUNTER — Other Ambulatory Visit: Payer: Self-pay

## 2019-11-24 ENCOUNTER — Encounter: Payer: Self-pay | Admitting: Internal Medicine

## 2019-11-24 ENCOUNTER — Encounter: Payer: Self-pay | Admitting: Certified Registered"

## 2019-11-24 ENCOUNTER — Encounter
Admission: RE | Disposition: A | Discharge status: Home / Self Care | Payer: Self-pay | Source: Home / Self Care | Attending: Internal Medicine

## 2019-11-24 ENCOUNTER — Ambulatory Visit
Admission: RE | Admit: 2019-11-24 | Discharge: 2019-11-24 | Disposition: A | Discharge status: Home / Self Care | Payer: Medicaid Other | Attending: Internal Medicine | Admitting: Internal Medicine

## 2019-11-24 DIAGNOSIS — Z933 Colostomy status: Secondary | ICD-10-CM | POA: Diagnosis not present

## 2019-11-24 DIAGNOSIS — F329 Major depressive disorder, single episode, unspecified: Secondary | ICD-10-CM | POA: Diagnosis not present

## 2019-11-24 DIAGNOSIS — Z79899 Other long term (current) drug therapy: Secondary | ICD-10-CM | POA: Diagnosis not present

## 2019-11-24 DIAGNOSIS — K529 Noninfective gastroenteritis and colitis, unspecified: Secondary | ICD-10-CM | POA: Insufficient documentation

## 2019-11-24 DIAGNOSIS — N184 Chronic kidney disease, stage 4 (severe): Secondary | ICD-10-CM | POA: Diagnosis not present

## 2019-11-24 DIAGNOSIS — Z791 Long term (current) use of non-steroidal anti-inflammatories (NSAID): Secondary | ICD-10-CM | POA: Diagnosis not present

## 2019-11-24 DIAGNOSIS — R41841 Cognitive communication deficit: Secondary | ICD-10-CM | POA: Diagnosis not present

## 2019-11-24 DIAGNOSIS — K625 Hemorrhage of anus and rectum: Secondary | ICD-10-CM | POA: Diagnosis present

## 2019-11-24 DIAGNOSIS — Z9049 Acquired absence of other specified parts of digestive tract: Secondary | ICD-10-CM | POA: Diagnosis not present

## 2019-11-24 DIAGNOSIS — F411 Generalized anxiety disorder: Secondary | ICD-10-CM | POA: Diagnosis not present

## 2019-11-24 DIAGNOSIS — I129 Hypertensive chronic kidney disease with stage 1 through stage 4 chronic kidney disease, or unspecified chronic kidney disease: Secondary | ICD-10-CM | POA: Insufficient documentation

## 2019-11-24 HISTORY — PX: FLEXIBLE SIGMOIDOSCOPY: SHX5431

## 2019-11-24 HISTORY — DX: Anxiety disorder, unspecified: F41.9

## 2019-11-24 SURGERY — SIGMOIDOSCOPY, FLEXIBLE

## 2019-11-24 MED ORDER — SODIUM CHLORIDE 0.9 % IV SOLN: INTRAVENOUS | Status: DC

## 2019-11-24 NOTE — Progress Notes (Signed)
Patient to postop area follow Flexible Sigmoidoscopy without any sedation or anesthesia. Patient is alert and oriented, vital signs remain stable, denies pain/ discomfort.  Dr. Alice Reichert at the bedside, explained procedural findings as well as the plan of care going forward, including new prescription that will be called in by Dr. Ricky Stabs office staff.  Follow-up appointment to be scheduled by Dr Ricky Stabs office as well.  Discharge instructions given to patient's caregiver, [place] which included written discharge instructions.  Patient discharged.

## 2019-11-24 NOTE — H&P (Signed)
H&P from 11/06/2019:    Deanna Aguilar, [place] - 11/06/2019 2:00 PM EST Formatting of this note might be different from the original. PATIENT PROFILE: Deanna Aguilar is a 63 y.o. female who presents to the [place] for consultation at the request of Deanna Miyamoto, MD for rectal bleeding.   HISTORY OF PRESENT ILLNESS   Deanna Aguilar presents to the clinic today at the request of Dr. Celedonio Miyamoto, MD for rectal bleeding. She presents today with caregiver from [facility] where patient is a resident. Patient reports a PMH significant for hx of diverticulitis with perforation s/p sigmoid colectomy with end colostomy 06/2015. Her surgeon at the time - Dr. Dalbert Batman in [place] has retired. She reports he has been keeping care of the colostomy by herself and receiving supplies from a medical store. Her main compliant today is rectal bleeding through her rectum and not the colostomy. She reports the blood started in Feb 2019 where she had light red colored blood in her underwear. She reports this occurred daily for appx 1 week and then went away for two months. She presented to the ED July 2019 for rectal bleeding at [facility] where she was advised to f/u with GI - but this was not done. Hemoglobin at this time was 10.2. Patient reports her last colonoscopy was appx 5 years ago and was done in [place]. She is unsure where this done or what the results were. Report is not available for my review today. She reports over the past few months she has noticed bright red and light red blood in her panties. She reports there is no rectal pain or abdominal pain associated with this. She reports the output from her ostomy bag has been dark green in appearance. She denies any tarry appearing stool in the ostomy. She is on oral iron supplementation. She reports about one month ago she was taking Aleve three tablets daily for arthritis symptoms. Her medication list also lists Mobic 15 mg daily for  arthritis. She denies any bright red blood in her ostomy bag. She denies any nausea, vomiting, dysphagia, odynophagia, early satiety, or unintentional weight loss. She denies any frequent heartburn or acid reflux symptoms. No known family hx of colon cancer, adenomatous polyps, or IBD. She denies any previous history of radiation exposure.   GENERAL REVIEW OF SYSTEMS: General: negative for - fever, chills, weight gain, weight loss, fatigue,  Head: no injury, migraines, headaches Eyes: no jaundice, itching, dryness, tearing, redness, vision changes Nose: no injury, bleeding Mouth/Throat: no oral ulcers, swollen neck, dry mouth, sore throat, hoarseness Endocrine: no heat/cold intolerance Respiratory: no cough, wheezing, SOB Cardiovascular: no chest pain, palpitations GI: see HPI Musculoskeletal: no joint swelling, muscle/joint pain  Neurological: no seizures, syncope, dizziness, numbness/tingling Skin: no rashes, itching Hematological and Lymphatic: easy bruising, easy bleeding  MEDICATIONS: Outpatient Encounter Medications as of 11/06/2019  Medication Sig Dispense Refill  . acetaminophen (TYLENOL) 325 MG tablet Take by mouth  . adhesive remover Pack Use  . ALPRAZolam (XANAX) 0.5 MG tablet Take 0.5 mg by mouth nightly as needed for Sleep  . ascorbic acid, vitamin C, (VITAMIN C) 500 MG tablet Take 500 mg by mouth once daily  . capsaicin-methyl sal-menthol 0.025-20-10 % Lotn Apply topically  . cholecalciferol (VITAMIN D3) 2,000 unit capsule Take 2,000 Units by mouth once daily  . ferrous sulfate 325 (65 FE) MG EC tablet Take 325 mg by mouth daily with breakfast  . melatonin 5 mg Cap Take by mouth  .  meloxicam (MOBIC) 15 MG tablet Take 15 mg by mouth once daily  . menthol (BIOFREEZE, MENTHOL,) 4 % Gel Apply topically  . ondansetron (ZOFRAN) 4 MG tablet Take 4 mg by mouth every 8 (eight) hours as needed for Nausea  . ostomy supplies (CENTERPOINTLOCK DRAINABLE POUC) 1 3/4 " Misc Use  . ostomy  supplies (SKIN PREP WIPES) Misc Use  . potassium chloride (KLOR-CON) 20 MEQ ER tablet Take 20 mEq by mouth once daily  . risperiDONE (RISPERDAL) 2 MG tablet Take 2 mg by mouth 2 (two) times daily  . sennosides-docusate (SENOKOT-S) 8.6-50 mg tablet Take 1 tablet by mouth once daily  . sertraline (ZOLOFT) 25 MG tablet Take 25 mg by mouth 2 (two) times daily  . traMADoL (ULTRAM) 50 mg tablet Take 50 mg by mouth every 6 (six) hours as needed for Pain   No facility-administered encounter medications on file as of 11/06/2019.   ALLERGIES: Patient has no allergy information on record.  PAST MEDICAL HISTORY: History reviewed. No pertinent past medical history.  PAST SURGICAL HISTORY: History reviewed. No pertinent surgical history.   FAMILY HISTORY: History reviewed. No pertinent family history.   SOCIAL HISTORY: Social History   Socioeconomic History  . Marital status: Legally Separated  Spouse name: Not on file  . Number of children: Not on file  . Years of education: Not on file  . Highest education level: Not on file  Occupational History  . Not on file  Social Needs  . Financial resource strain: Not on file  . Food insecurity  Worry: Not on file  Inability: Not on file  . Transportation needs  Medical: Not on file  Non-medical: Not on file  Tobacco Use  . Smoking status: Never Smoker  . Smokeless tobacco: Never Used  Substance and Sexual Activity  . Alcohol use: Not on file  . Drug use: Not on file  . Sexual activity: Not on file  Other Topics Concern  . Not on file  Social History Narrative  . Not on file   Vitals:  11/06/19 1420  BP: 118/73  BP Location: Left upper arm  Patient Position: Sitting  BP Cuff Size: Adult  Pulse: 72  Temp: 37.2 C (98.9 F)   Wt Readings from Last 3 Encounters:  No data found for Wt   PHYSICAL EXAM: Pleasant, non-toxic appearing female on exam bench. Answers most questions appropriately. Caregiver present during exam but is  not able to answer any pertinent questions.  General: NAD, alert and oriented x 4 HEENT: PEERLA, EOMI, anciteric  Neck: supple, no JVD or thyromegaly. No lymphadenopathy.  Respiratory: CTA bilaterally, no wheezes, crackles, or other adventitious sounds Cardiac: RRR, no murmur, rub, or gallop  GI: soft, normal bowel sounds, no TTP, no HSM, no rebound or guarding. Ostomy located in LLQ with dark green output with no overt bright red blood or tarry stools. No drainage or leakage from ostomy site. Ostomy site without any induration or tenderness.  Rectal: external exam with several non-thrombosed hemorrhoids and skin tag. Internal exam revealed light red blood on finger with smooth rectal wall. No obvious masses or fissure. Guaiac positive.  Msk: no edema, well perfused with 2+ pulses, Skin: Skin color, texture, turgor normal, no rashes or lesions Lymph: no LAD Neuro: Grossly intact   REVIEW OF DATA: I have reviewed the following data today:  CT abd/pelvis 06/2018:  Result Date: 07/03/2018 CLINICAL DATA: Abdominal pain and distention. Colostomy. Rectal bleeding. EXAM: CT ABDOMEN AND PELVIS WITH  CONTRAST TECHNIQUE: Multidetector CT imaging of the abdomen and pelvis was performed using the standard protocol following bolus administration of intravenous contrast. CONTRAST: 158mL OMNIPAQUE IOHEXOL 300 MG/ML SOLN COMPARISON: CT abdomen pelvis 06/07/2015 FINDINGS: LOWER CHEST: No basilar pulmonary nodules or pleural effusion. No apical pericardial effusion. HEPATOBILIARY: Normal hepatic contours and density. No intra- or extrahepatic biliary dilatation. Normal gallbladder. PANCREAS: Normal parenchymal contours without ductal dilatation. No peripancreatic fluid collection. SPLEEN: Normal. ADRENALS/URINARY TRACT: --Adrenal glands: Normal. --Right kidney/ureter: No hydronephrosis, nephroureterolithiasis, perinephric stranding or solid renal mass. --Left kidney/ureter: No hydronephrosis, nephroureterolithiasis,  perinephric stranding or solid renal mass. --Urinary bladder: Normal for degree of distention STOMACH/BOWEL: --Stomach/Duodenum: No hiatal hernia or other gastric abnormality. Normal duodenal course. --Small bowel: No dilatation or inflammation. --Colon: Left lower quadrant colostomy with Hartmann pouch. There is a large peristomal hernia that contains loops of small bowel and colon without evidence of obstruction or inflammation. There are also multiple ventral abdominal hernias containing various segments of bowel. The lowest and largest contains a nondilated segment of small bowel. --Appendix: Normal. VASCULAR/LYMPHATIC: Normal course and caliber of the major abdominal vessels. No abdominal or pelvic lymphadenopathy. REPRODUCTIVE: Normal uterus and ovaries. MUSCULOSKELETAL. No bony spinal canal stenosis or focal osseous abnormality. OTHER: None. IMPRESSION: 1. Left lower quadrant colostomy with large parastomal hernia containing small bowel and colon without evidence of obstruction or inflammation. 2. Three other ventral abdominal hernias containing various amounts of small bowel or colon. The largest, most inferior hernia is at the level of the iliac crest and contains a long segment of nondilated small bowel. Electronically Signed By: Ulyses Jarred M.D. On: 07/03/2018 21:11    ASSESSMENT AND PLAN: Ms. Lisiecki is a 63 y.o. female presenting for an initial consultation. Diagnoses and all orders for this visit:  Rectal bleeding - CBC w/auto Differential (5 Part) - Ferritin - Iron Panel - Comprehensive Metabolic Panel (CMP) - POC Occult Blood - Stool Culture - [facility] - Giardia, EIA; Ova/Parasite - [facility] - C difficile Toxins A+B, EIA - [facility] - Calprotectin, Fecal - [facility] - Ambulatory Referral to Flexible Sigmoidoscopy  Colostomy in place (CMS-HCC)  Hx of diverticulitis of colon  Cognitive communication deficit  Chronic kidney disease (CKD), stage IV (severe) (CMS-HCC)  PAF (paroxysmal  atrial fibrillation) (CMS-HCC)  Schizophrenia, chronic condition (CMS-HCC)  GAD (generalized anxiety disorder)    63 y/o AA female with a PMH of paroxsymal atrial fibrillation not on chronic anticoagulation, depression, chronic low back pain, OA, HTN, cognitive communication deficit, GAD, CKD Stage IV, and Hx of perforated diverticulitis s/p partial colectomy and end colosctomy 2016 presents for chief complaint of rectal bleeding  1. Rectal bleeding 2. + Colostomy 2/2 sigmoid colectomy  3. Cognitive communication deficit  -Differential includes diversion colitis, infectious colitis, ischemic colitis, inflammatory bowel disease, segmental colitis, GIT malignancy, IBS -We will check CBC, CMP, ferritin, iron panel -Stool studies to rule out infectious etiology -Discussed case with Dr. Alice Reichert who also examined patient. He agrees with above w/u as well as proceeding with flex sig through rectum to take biopsies to rule out IBD, malignancy, etc -If diversion colitis is present, would ultimately need reanastomosis for definitive management, but less likely to be performed given her comorbidities. Could consider short chain fatty acid enemas -Proceed with endoscopy through rectumat [facility] without sedation -She will be given instructions to use enemas prior to procedure for clean out -Further recommendations after procedure  She was advised to contact our office sooner for any worsening or concerning symptoms. ED precautions reviewed.  I personally performed the service, non-incident to. (WP)   GRANVILLE MASON Barada, PA  65 minutes of patient care time, >50% face to face  Octavia Bruckner, PA-C [facility], Gastroenterology [place], [place] [address] [phone]  Electronically signed by Deanna Aguilar, [place] at 11/10/2019 5:47 PM EST   Attending Physician Attestation:  I have obtained an interval history from the patient, reviewed the chart and performed a  physical examination. The Advanced Practitioner's note, clinical impression and recommendations have been formulated with my input and I agree with the assessment and plan as outlined above.  Thank you for allowing [place] to be involved in this patient's care.  Please call me if you have any questions.   Robet Leu, M.D. [facility]  [phone] - Office [phone]- Cell phone

## 2019-11-24 NOTE — Interval H&P Note (Signed)
History and Physical Interval Note:  11/24/2019 2:46 PM  Deanna Aguilar  has presented today for surgery, with the diagnosis of RECTAL BLEED.  The various methods of treatment have been discussed with the patient and family. After consideration of risks, benefits and other options for treatment, the patient has consented to  Procedure(s) with comments: FLEXIBLE SIGMOIDOSCOPY (N/A) - NON-SEDATED FLEX SIGM PER OFFICE as a surgical intervention.  The patient's history has been reviewed, patient examined, no change in status, stable for surgery.  I have reviewed the patient's chart and labs.  Questions were answered to the patient's satisfaction.     [place], [place]

## 2019-11-24 NOTE — Op Note (Signed)
[facility] Patient Name: Deanna Aguilar Procedure Date: 11/24/2019 2:23 PM MRN: [id number] Account #: [id number] Date of Birth: 1956/06/10 Admit Type: Outpatient Age: 63 Room: [facility] ENDO ROOM 1 Gender: Female Note Status: Finalized Procedure:             Flexible Sigmoidoscopy Indications:           Hematochezia Providers:             Benay Pike. Cinsere Mizrahi MD, MD Medicines:             Patient having bloody rectal discharge since diversion                         surgery away from rectum. c. [id number] Complications:         No immediate complications. Estimated blood loss:                         Minimal. Procedure:             Pre-Anesthesia Assessment:                        - The risks and benefits of the procedure and the                         sedation options and risks were discussed with the                         patient. All questions were answered and informed                         consent was obtained.                        - Patient identification and proposed procedure were                         verified prior to the procedure by the nurse. The                         procedure was verified in the procedure room.                        - ASA Grade Assessment: III - A patient with severe                         systemic disease.                        - After reviewing the risks and benefits, the patient                         was deemed in satisfactory condition to undergo the                         procedure.                        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 All questions were answered and informed                         consent was obtained.                        - Patient identification and  proposed procedure were                         verified prior to the procedure by the nurse. The                         procedure was verified in the procedure room.                        - ASA Grade Assessment: III - A patient with severe                         systemic disease.                        - After reviewing the risks and benefits, the patient                         was deemed in satisfactory condition to undergo the                         procedure.                        After obtaining informed consent, the scope was passed                         under direct vision. The Endoscope was introduced                         through the anus and advanced to the the rectosigmoid                         junction. The flexible sigmoidoscopy was accomplished                         without difficulty. The patient tolerated the                         procedure well. The quality of the bowel preparation                         was good. Findings:      The perianal and digital rectal examinations were normal. Pertinent  negatives include normal sphincter tone and no palpable rectal lesions.      A scattered area of moderately friable mucosa with contact bleeding was       found in the rectum and in the recto-sigmoid colon. Biopsies were taken       with a cold forceps for histology. Impression:            - Friability with contact bleeding in the rectum and                         in the recto-sigmoid colon. Biopsied. Recommendation:        - Use Canasa 500 mg suppository 1 per rectum BID for 4                         weeks.                        - Return to physician assistant in 6 weeks.                        - The findings and recommendations were discussed with                         the patient. Procedure Code(s):     --- Professional ---                        [phone], 32, Sigmoidoscopy, flexible; with biopsy,                         single or multiple Diagnosis  Code(s):     --- Professional ---                        K92.1, Melena (includes Hematochezia)                        K92.2, Gastrointestinal hemorrhage, unspecified                        K62.5, Hemorrhage of anus and rectum CPT copyright 2019 American Medical Association. All rights reserved. The codes documented in this report are preliminary and upon coder review may  be revised to meet current compliance requirements. Attending Participation:      I personally performed the entire procedure. Efrain Sella MD, MD 11/24/2019 3:10:20 PM This report has been signed electronically. Number of Addenda: 0 Note Initiated On: 11/24/2019 2:23 PM Total Procedure Duration: 0 hours 4 minutes 38 seconds  Estimated Blood Loss:  Estimated blood loss was minimal.      [facility]

## 2019-11-25 ENCOUNTER — Encounter: Payer: Self-pay | Admitting: *Deleted

## 2019-12-01 LAB — SURGICAL PATHOLOGY

## 2020-05-13 ENCOUNTER — Telehealth: Payer: Self-pay | Admitting: *Deleted

## 2020-05-13 NOTE — Telephone Encounter (Signed)
Received order for Ostomy supplies and request for recent OV notes from [place] was last addressed in [facility] tele appt on 09/24/2019. Patient will need new appt. Last PCP appt was 07/2018. Will forward to [employer] to assist patient in making first available appt with PCP. Hubbard Hartshorn, BSN, RN-BC

## 2020-05-13 NOTE — Telephone Encounter (Signed)
Is there anything I need to do? She may need an [facility] appointment if she needs a recent OV for her ostomy supplies refill.

## 2020-05-14 NOTE — Telephone Encounter (Signed)
Spoke with patient, pt is aware she needs an appt. Pt states she live in nursing home in  [place], [place] and cannot come to [place]. Informed the patient to find another pcp in [place] or come to [place] for an appt.

## 2020-05-17 ENCOUNTER — Other Ambulatory Visit: Payer: Self-pay

## 2020-05-17 ENCOUNTER — Encounter: Payer: Self-pay | Admitting: Internal Medicine

## 2020-05-17 ENCOUNTER — Ambulatory Visit (INDEPENDENT_AMBULATORY_CARE_PROVIDER_SITE_OTHER): Payer: Medicaid Other | Admitting: Internal Medicine

## 2020-05-17 DIAGNOSIS — K5720 Diverticulitis of large intestine with perforation and abscess without bleeding: Secondary | ICD-10-CM | POA: Diagnosis not present

## 2020-05-17 DIAGNOSIS — Z939 Artificial opening status, unspecified: Secondary | ICD-10-CM | POA: Diagnosis not present

## 2020-05-17 NOTE — Assessment & Plan Note (Signed)
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  The surgery was initially performed by Dr. Dalbert Batman, who has since retired.  Patient reports skin ulcerations and irritation around her ostomy site that has been present for the past several months.  She thinks this is due to her to frequently changing her ostomy bag.  Patient reports that she was instructed to change it every 3 days, but started changing it every day.  Now she does it every 3 days again since the skin irritations have appeared.  The patient denies any ostomy output changes, fevers, significant bleeding or abdominal pain at this time.  We discussed how it is in her best interest to follow-up with the surgeon to follow-up her ostomy maintenance.  Patient is agreeable to this, however states that she would rather follow-up with her PCP, Dr. Dareen Piano prior to referral to surgery.  I think this is reasonable given that she has not been seen by her PCP for couple years.  The patient states that she is scheduled to be discharged from her nursing home facility within the next month and will make an appointment as soon as possible once discharged.  Plan: -Ostomy supplies refilled.  Paperwork has been completed  -Schedule follow-up appointment with PCP upon discharge from nursing home -Patient would benefit from general surgery follow-up to determine if she is a candidate for ostomy takedown

## 2020-05-17 NOTE — Progress Notes (Signed)
Internal Medicine Clinic Attending  Case discussed with Dr. Alexander at the time of the visit.  We reviewed the resident's history and exam and pertinent patient test results.  I agree with the assessment, diagnosis, and plan of care documented in the resident's note.  

## 2020-05-17 NOTE — Telephone Encounter (Signed)
Call placed to patient. Scheduled tele appt today in [facility] to discuss need for ostomy supplies. States she may be getting out of the nursing home next month. She will call back to schedule appt with PCP when she is d/c. L. Sorrel Cassetta, BSN, RN-BC

## 2020-05-17 NOTE — Progress Notes (Signed)
  [facility] Internal Medicine Residency Telephone Encounter Continuity Care A ppointment  HPI:   This telephone encounter was created for Ms. Deanna Aguilar on 05/17/2020 for the following purpose/cc ostomy supply refill.   Deanna Aguilar is a 64 year old female with a past medical history as noted below who presents for a telehealth visit for ostomy supplies refill.  The patient currently lives in [place] at a nursing home facility but she does all of her ostomy bag changes.  The patient initially had her ostomy back in 2016, and she has been taking care of it since then.  She notes that she is confident and has been changing her bags consistently without help.  Of note, roughly 7 months ago the patient states that she became a little more anxious about changing her bag.  She was instructed to change it every 3 days, but started changing it every day.  Because of this she says that the skin around the ostomy site has become more irritated and describes ulcerating lesions around the site.  She states that they intermittently bleed from time to time, but does not have constant bleeding.  The area of the skin is irritated and painful at times.  It has not improved much over the past several months despite going back to changing it every 3 days.  She states that she has not seen a Psychologist, sport and exercise for years.  Dr. Dalbert Batman initially performed the surgery, but has retired since.  The patient and I discussed at great length about the importance of following up with the surgeon, given the fact that she is having skin irritation around the site and perhaps may be a candidate for takedown in the near future.  Patient is in agreement.  She states that since she has not seen her PCP, Dr. Dareen Piano, she would prefer to schedule an appointment with him prior to going to a surgeon.  She denies having any changes in her ostomy output, bloody bowel movements, nausea or vomiting, abdominal pain or fevers.   Past Medical History:    Past Medical History:  Diagnosis Date  . Anxiety   . Arthritis   . Colostomy in place [facility])   . Depression   . Diverticulitis   . Hypertension   . Schizophrenia ([place])       ROS:   Negative unless mentioned in the HPI   Assessment / Plan / Recommendations:   Please see A&P under problem oriented charting for assessment of the patient's acute and chronic medical conditions.  As always, pt is advised that if symptoms worsen or new symptoms arise, they should go to an urgent care facility or to to ER for further evaluation.   Consent and Medical Decision Making:   Patient discussed with Dr. Angelia Mould  This is a telephone encounter between Deanna Aguilar and Earlene Plater on 05/17/2020 for ostomy supply refill. The visit was conducted with the patient located at home and Earlene Plater at [facility]. The patient's identity was confirmed using their DOB and current address. The patient has consented to being evaluated through a telephone encounter and understands the associated risks (an examination cannot be done and the patient may need to come in for an appointment) / benefits (allows the patient to remain at home, decreasing exposure to coronavirus). I personally spent 15 minutes on medical discussion.

## 2020-05-17 NOTE — Telephone Encounter (Signed)
Thank you :)

## 2020-05-17 NOTE — Telephone Encounter (Signed)
Order for ostomy supplies along with today's OV note faxed to Augusto Gamble at [employer] at [phone]. Fax confirmation receipt received. Hubbard Hartshorn, BSN, RN-BC

## 2020-07-09 ENCOUNTER — Ambulatory Visit (INDEPENDENT_AMBULATORY_CARE_PROVIDER_SITE_OTHER): Payer: Medicaid Other | Admitting: Internal Medicine

## 2020-07-09 ENCOUNTER — Other Ambulatory Visit: Payer: Self-pay

## 2020-07-09 ENCOUNTER — Encounter: Payer: Self-pay | Admitting: Internal Medicine

## 2020-07-09 VITALS — BP 107/59 | HR 87 | Temp 99.0°F | Wt 247.5 lb

## 2020-07-09 DIAGNOSIS — L98499 Non-pressure chronic ulcer of skin of other sites with unspecified severity: Secondary | ICD-10-CM

## 2020-07-09 DIAGNOSIS — K5720 Diverticulitis of large intestine with perforation and abscess without bleeding: Secondary | ICD-10-CM

## 2020-07-09 DIAGNOSIS — I10 Essential (primary) hypertension: Secondary | ICD-10-CM | POA: Diagnosis not present

## 2020-07-09 DIAGNOSIS — Z933 Colostomy status: Secondary | ICD-10-CM | POA: Diagnosis not present

## 2020-07-09 NOTE — Assessment & Plan Note (Addendum)
BP well controlled at 107/59.  No dizziness, headache. Patient is on HCTZ 25 mg daily, metoprolol tartrate 50 mg BID  -Continue current antihypertensive regimen -Checking BMP today

## 2020-07-09 NOTE — Progress Notes (Signed)
   CC:   HPI:  [name] is a 64 y.o. female with PMHx as documented below, presented to be evaluated for surgery referral  (has colostomy) and also for ulcers around her ostomy. She was discharged form SNF 2 days ago. Patient has not seen a surgeon for several years because her surgeon, Dr. Dalbert Batman retired. On prior televisit, she reported having chronic skin irritation that turned to ulcer around the colostomy site on her abdomen she also mentioned that she has not seen a surgeon for a while. She was advised to be seen in clinic to consider surgery referral (to see if she is a candidate for ostomy takedown). Please refer to problem based charting for further details and assessment and plan of current problem and chronic medical conditions.  PMHx: HTN, paroxysmal A. fib, rectal bleeding, colostomy in place, depression, morbid obesity  Medications: HCTZ 25 mg daily, metoprolol tartrate 50 mg twice daily, risperidone 2 mg twice daily, sertraline 25 mg twice daily, Bactrim, tramadol   Past Medical History:  Diagnosis Date  . Anxiety   . Arthritis   . Colostomy in place [facility])   . Depression   . Diverticulitis   . Hypertension   . Schizophrenia ([place])    Review of Systems:    Review of Systems  Constitutional: Negative for chills and fever.  Neurological: Negative for dizziness.   Physical Exam:  Vitals:   07/09/20 1101  BP: (!) 107/59  Pulse: 87  Temp: 99 F (37.2 C)  TempSrc: Oral  SpO2: 97%  Weight: 247 lb 8 oz (112.3 kg)    Constitutional: In no acute distress.  HENT:  Head: Normocephalic and atraumatic.  Eyes: Conjunctivae are normal, EOM nl Cardiovascular: RRR, nl S1S2, no murmur, trace bilateral LEE Respiratory: Effort normal and breath sounds normal. No respiratory distress. No wheezes.  GI: Colostomy bag is in place. Soft. Scar of prior surgery is present. Bowel sounds are normal. No distension. There is no tenderness.  Neurological: Is alert and oriented  x3. Has some tremor. Skin: Multiple noninfected ulcer around ostomy with erythematous base   Image of abdomen  (ostomy bag was in place before taking the picture)   Assessment & Plan:   See Encounters Tab for problem based charting.  Patient discussed with Dr. Philipp Ovens

## 2020-07-09 NOTE — Patient Instructions (Addendum)
Thank you for allowing [place] to provide your care today. We would like to refer you to wound care clinic for the wound around your ostomy area and also refer you to surgeon to evaluate your colostomy and to see if it can be reversed or not. I understand that you will be out of town for 2 months, so we will wait till you will be back. Please follow up with your PCP Dr. Dareen Piano when you come back for the referral. Today we made no changes to your medications.   I order some lab and will call you if result will be abnormal.  As always, if having severe symptoms, please seek medical attention at emergency room. Should you have any questions or concerns please call the internal medicine clinic at [phone].    Thank you!

## 2020-07-09 NOTE — Assessment & Plan Note (Addendum)
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 Patient reports that, it has been a chronic issue. She reports that the wounds are sometimes painful but no change in the appearance of the ulcer. No fever, chills and no drainage from the ulcer.  We recommended referral to wound care clinic and home health services. However, she is traveling tomorrow and will be out of town for 2 months.  -Will place referral to wound care clinic (Per Ivin Booty, patient may need to go to [facility] for further management) +/- home health when she come back to town.  She should seek medical attention meanwhile, if she notices any changes such as evidence of infection.       Image of abdomen  (colostomy bag was in place before taking the picture)

## 2020-07-09 NOTE — Assessment & Plan Note (Addendum)
Will refer to surgery to assess for probable takedown. ADDENDUM: patient is traveling tomorrow and will be out of town for 2 months, so that I hold off of placing the referral until she comes back.  She will come back to clinic in 2 months and to consider surgery referral then.

## 2020-07-10 LAB — BMP8+ANION GAP
Anion Gap: 21 mmol/L — ABNORMAL HIGH (ref 10.0–18.0)
BUN/Creatinine Ratio: 21 (ref 12–28)
BUN: 31 mg/dL — ABNORMAL HIGH (ref 8–27)
CO2: 17 mmol/L — ABNORMAL LOW (ref 20–29)
Calcium: 9.6 mg/dL (ref 8.7–10.3)
Chloride: 106 mmol/L (ref 96–106)
Creatinine, Ser: 1.5 mg/dL — ABNORMAL HIGH (ref 0.57–1.00)
GFR calc Af Amer: 43 mL/min/{1.73_m2} — ABNORMAL LOW (ref >59)
GFR calc non Af Amer: 37 mL/min/{1.73_m2} — ABNORMAL LOW (ref >59)
Glucose: 99 mg/dL (ref 65–99)
Potassium: 3.9 mmol/L (ref 3.5–5.2)
Sodium: 144 mmol/L (ref 134–144)

## 2020-07-14 NOTE — Progress Notes (Signed)
Internal Medicine Clinic Attending  Case discussed with Dr. Masoudi  At the time of the visit.  We reviewed the resident's history and exam and pertinent patient test results.  I agree with the assessment, diagnosis, and plan of care documented in the resident's note.
